# Patient Record
Sex: Female | Born: 1935
Health system: Southern US, Community
[De-identification: ages and names within clinical notes are randomized; demographics above are authoritative.]

## PROBLEM LIST (undated history)

## (undated) DIAGNOSIS — I10 Essential (primary) hypertension: Secondary | ICD-10-CM

## (undated) DIAGNOSIS — D649 Anemia, unspecified: Secondary | ICD-10-CM

## (undated) DIAGNOSIS — M1990 Unspecified osteoarthritis, unspecified site: Secondary | ICD-10-CM

## (undated) DIAGNOSIS — F0390 Unspecified dementia without behavioral disturbance: Secondary | ICD-10-CM

## (undated) HISTORY — PX: NO PAST SURGERIES: SHX2092

## (undated) HISTORY — DX: Unspecified osteoarthritis, unspecified site: M19.90

## (undated) HISTORY — DX: Anemia, unspecified: D64.9

---

## 2013-08-17 ENCOUNTER — Emergency Department (HOSPITAL_COMMUNITY)
Admission: EM | Admit: 2013-08-17 | Discharge: 2013-08-17 | Disposition: A | Discharge status: Home / Self Care | Payer: PRIVATE HEALTH INSURANCE | Attending: Emergency Medicine | Admitting: Emergency Medicine

## 2013-08-17 ENCOUNTER — Encounter (HOSPITAL_COMMUNITY): Payer: Self-pay | Admitting: Emergency Medicine

## 2013-08-17 DIAGNOSIS — Z4801 Encounter for change or removal of surgical wound dressing: Secondary | ICD-10-CM | POA: Insufficient documentation

## 2013-08-17 DIAGNOSIS — Z79899 Other long term (current) drug therapy: Secondary | ICD-10-CM | POA: Insufficient documentation

## 2013-08-17 DIAGNOSIS — Z5189 Encounter for other specified aftercare: Secondary | ICD-10-CM

## 2013-08-17 DIAGNOSIS — I10 Essential (primary) hypertension: Secondary | ICD-10-CM | POA: Insufficient documentation

## 2013-08-17 HISTORY — DX: Essential (primary) hypertension: I10

## 2013-08-17 LAB — GLUCOSE, CAPILLARY: Glucose-Capillary: 88 mg/dL (ref 70–99)

## 2013-08-17 MED ORDER — SULFAMETHOXAZOLE-TRIMETHOPRIM 800-160 MG PO TABS: 1.0000 | ORAL_TABLET | Freq: Two times a day (BID) | ORAL | Status: DC

## 2013-08-17 NOTE — ED Notes (Addendum)
Pt with wound to left lower leg from an ant bite a year ago, family member states that wound was oozing the other day, no drainage noted at this time; pt denies fever or N/V

## 2013-08-17 NOTE — ED Notes (Signed)
Sore area to  Lt lower leg.  Area has been there for 1 year.   On Friday, family member noticed that was worse. And wanted it checked before she returned to [place]

## 2013-08-17 NOTE — ED Notes (Signed)
CBG 88 

## 2013-08-19 NOTE — ED Provider Notes (Signed)
CSN: [id number]     Arrival date & time 08/17/13  1006 History   First MD Initiated Contact with Patient 08/17/13 1336     Chief Complaint  Patient presents with  . Wound Check   (Consider location/radiation/quality/duration/timing/severity/associated sxs/prior Treatment) HPI Comments: Katrina Berry is a 79 y.o. Female presenting for evaluation of a wound on her left lower anterior leg.  She was bit by fire ants one year ago, sustaining multiple areas of skin injury on her left lower leg.  There is one lesion that has not yet closed completely and has started oozing a small amount of clear yellow drainage,  Blood tinged, per the niece at bedside.  Pt is visiting here from [place] and will be returning home tomorrow and the niece "wanted this checked" before she went home.  The patient states she has undergone care for this by her pcp and also saw a wound specialist, describes having it lanced several months ago, and since then it never closed completely.  She denies pain at the site.  She denies fevers, chills, leg pain or other complaint.     The history is provided by the patient and a relative.    Past Medical History  Diagnosis Date  . Hypertension    History reviewed. No pertinent past surgical history. History reviewed. No pertinent family history. History  Substance Use Topics  . Smoking status: Never Smoker   . Smokeless tobacco: Not on file  . Alcohol Use: No   OB History   Grav Para Term Preterm Abortions TAB SAB Ect Mult Living                 Review of Systems  Constitutional: Negative for fever and chills.  HENT: Negative for facial swelling.   Respiratory: Negative for shortness of breath and wheezing.   Skin: Positive for wound.  Neurological: Negative for numbness.    Allergies  Review of patient's allergies indicates no known allergies.  Home Medications   Current Outpatient Rx  Name  Route  Sig  Dispense  Refill  . amLODipine (NORVASC) 10 MG  tablet   Oral   Take 10 mg by mouth daily.         Marland Kitchen losartan (COZAAR) 50 MG tablet   Oral   Take 50 mg by mouth daily.         Marland Kitchen sulfamethoxazole-trimethoprim (SEPTRA DS) 800-160 MG per tablet   Oral   Take 1 tablet by mouth every 12 (twelve) hours.   20 tablet   0    BP 150/68  Pulse 56  Temp(Src) 97.8 F (36.6 C) (Oral)  Resp 16  Ht 5\' 3"  (1.6 m)  Wt 145 lb (65.772 kg)  BMI 25.69 kg/m2  SpO2 100% Physical Exam  Constitutional: She appears well-developed and well-nourished. No distress.  HENT:  Head: Normocephalic.  Neck: Neck supple.  Cardiovascular: Normal rate.   Pulmonary/Chest: Effort normal. She has no wheezes.  Musculoskeletal: Normal range of motion. She exhibits no edema.  Skin: Skin is warm and dry.  Approx 0.25 cm pink based ulceration left lower anterior leg with surrounding darkened scar tissue.  No induration or fluctuance,  No drainage, no erythema or red streaking.  Pedal pulses are easily palpable,  Less than 3 sec cap refill.      ED Course  Procedures (including critical care time) Labs Review Labs Reviewed  GLUCOSE, CAPILLARY   Imaging Review No results found.  EKG Interpretation   None  MDM   1. Visit for wound check    Chronic wound which does not appear infected on exam but with hx of drainage,  Will cover for possible early infection.  Prescribed bactrim.  Encouraged elevation,  Warm soaks,  F/u by pcp once returns home.    The patient appears reasonably screened and/or stabilized for discharge and I doubt any other medical condition or other [facility] requiring further screening, evaluation, or treatment in the ED at this time prior to discharge.     Burgess Amor, PA-C 08/19/13 [phone]

## 2013-08-19 NOTE — ED Provider Notes (Signed)
History/physical exam/procedure(s) were performed by non-physician practitioner and as supervising physician I was immediately available for consultation/collaboration. I have reviewed all notes and am in agreement with care and plan.   Hilario Quarry, MD 08/19/13 [phone]

## 2013-09-16 DIAGNOSIS — I10 Essential (primary) hypertension: Secondary | ICD-10-CM | POA: Diagnosis not present

## 2013-09-16 DIAGNOSIS — Z6836 Body mass index (BMI) 36.0-36.9, adult: Secondary | ICD-10-CM | POA: Diagnosis not present

## 2014-07-16 DIAGNOSIS — I10 Essential (primary) hypertension: Secondary | ICD-10-CM | POA: Diagnosis not present

## 2014-07-16 DIAGNOSIS — L97929 Non-pressure chronic ulcer of unspecified part of left lower leg with unspecified severity: Secondary | ICD-10-CM | POA: Diagnosis not present

## 2014-07-16 DIAGNOSIS — Z79899 Other long term (current) drug therapy: Secondary | ICD-10-CM | POA: Diagnosis not present

## 2014-07-16 DIAGNOSIS — F0390 Unspecified dementia without behavioral disturbance: Secondary | ICD-10-CM | POA: Diagnosis not present

## 2014-07-16 DIAGNOSIS — L539 Erythematous condition, unspecified: Secondary | ICD-10-CM | POA: Diagnosis not present

## 2014-07-16 DIAGNOSIS — L97829 Non-pressure chronic ulcer of other part of left lower leg with unspecified severity: Secondary | ICD-10-CM | POA: Diagnosis not present

## 2014-07-17 DIAGNOSIS — S81802D Unspecified open wound, left lower leg, subsequent encounter: Secondary | ICD-10-CM | POA: Diagnosis not present

## 2014-07-17 DIAGNOSIS — F0390 Unspecified dementia without behavioral disturbance: Secondary | ICD-10-CM | POA: Diagnosis not present

## 2014-07-17 DIAGNOSIS — I10 Essential (primary) hypertension: Secondary | ICD-10-CM | POA: Diagnosis not present

## 2014-07-17 DIAGNOSIS — R001 Bradycardia, unspecified: Secondary | ICD-10-CM | POA: Diagnosis not present

## 2014-07-30 DIAGNOSIS — S81802D Unspecified open wound, left lower leg, subsequent encounter: Secondary | ICD-10-CM | POA: Diagnosis not present

## 2014-07-30 DIAGNOSIS — R001 Bradycardia, unspecified: Secondary | ICD-10-CM | POA: Diagnosis not present

## 2014-07-30 DIAGNOSIS — F0390 Unspecified dementia without behavioral disturbance: Secondary | ICD-10-CM | POA: Diagnosis not present

## 2014-08-05 DIAGNOSIS — I10 Essential (primary) hypertension: Secondary | ICD-10-CM | POA: Diagnosis not present

## 2014-08-17 DIAGNOSIS — L97929 Non-pressure chronic ulcer of unspecified part of left lower leg with unspecified severity: Secondary | ICD-10-CM | POA: Diagnosis not present

## 2014-09-03 DIAGNOSIS — S91002A Unspecified open wound, left ankle, initial encounter: Secondary | ICD-10-CM | POA: Diagnosis not present

## 2014-09-03 DIAGNOSIS — L851 Acquired keratosis [keratoderma] palmaris et plantaris: Secondary | ICD-10-CM | POA: Diagnosis not present

## 2014-09-03 DIAGNOSIS — I739 Peripheral vascular disease, unspecified: Secondary | ICD-10-CM | POA: Diagnosis not present

## 2014-09-03 DIAGNOSIS — B351 Tinea unguium: Secondary | ICD-10-CM | POA: Diagnosis not present

## 2014-09-07 ENCOUNTER — Other Ambulatory Visit (HOSPITAL_COMMUNITY): Payer: Self-pay | Admitting: Podiatry

## 2014-09-07 DIAGNOSIS — I739 Peripheral vascular disease, unspecified: Secondary | ICD-10-CM

## 2014-09-13 ENCOUNTER — Ambulatory Visit (HOSPITAL_COMMUNITY): Payer: PRIVATE HEALTH INSURANCE

## 2014-09-15 ENCOUNTER — Ambulatory Visit (HOSPITAL_COMMUNITY)
Admission: RE | Admit: 2014-09-15 | Discharge: 2014-09-15 | Disposition: A | Discharge status: Home / Self Care | Payer: Medicare Other | Source: Ambulatory Visit | Attending: Podiatry | Admitting: Podiatry

## 2014-09-15 DIAGNOSIS — I739 Peripheral vascular disease, unspecified: Secondary | ICD-10-CM | POA: Diagnosis not present

## 2014-09-15 DIAGNOSIS — S71102A Unspecified open wound, left thigh, initial encounter: Secondary | ICD-10-CM | POA: Diagnosis not present

## 2014-09-17 DIAGNOSIS — L851 Acquired keratosis [keratoderma] palmaris et plantaris: Secondary | ICD-10-CM | POA: Diagnosis not present

## 2014-09-17 DIAGNOSIS — S91002A Unspecified open wound, left ankle, initial encounter: Secondary | ICD-10-CM | POA: Diagnosis not present

## 2014-09-17 DIAGNOSIS — I739 Peripheral vascular disease, unspecified: Secondary | ICD-10-CM | POA: Diagnosis not present

## 2014-09-17 DIAGNOSIS — B351 Tinea unguium: Secondary | ICD-10-CM | POA: Diagnosis not present

## 2014-09-23 DIAGNOSIS — I872 Venous insufficiency (chronic) (peripheral): Secondary | ICD-10-CM | POA: Diagnosis not present

## 2015-01-11 DIAGNOSIS — R413 Other amnesia: Secondary | ICD-10-CM | POA: Diagnosis not present

## 2015-01-11 DIAGNOSIS — I10 Essential (primary) hypertension: Secondary | ICD-10-CM | POA: Diagnosis not present

## 2015-01-11 DIAGNOSIS — R630 Anorexia: Secondary | ICD-10-CM | POA: Diagnosis not present

## 2015-01-27 DIAGNOSIS — E784 Other hyperlipidemia: Secondary | ICD-10-CM | POA: Diagnosis not present

## 2015-01-27 DIAGNOSIS — I10 Essential (primary) hypertension: Secondary | ICD-10-CM | POA: Diagnosis not present

## 2015-07-07 ENCOUNTER — Ambulatory Visit (INDEPENDENT_AMBULATORY_CARE_PROVIDER_SITE_OTHER): Payer: Medicare Other | Admitting: Family Medicine

## 2015-07-07 ENCOUNTER — Other Ambulatory Visit: Payer: Self-pay

## 2015-07-07 ENCOUNTER — Encounter: Payer: Self-pay | Admitting: Family Medicine

## 2015-07-07 VITALS — BP 166/80 | HR 56 | Resp 18 | Ht 60.0 in | Wt 164.0 lb

## 2015-07-07 DIAGNOSIS — Z23 Encounter for immunization: Secondary | ICD-10-CM

## 2015-07-07 DIAGNOSIS — M25511 Pain in right shoulder: Secondary | ICD-10-CM | POA: Insufficient documentation

## 2015-07-07 DIAGNOSIS — S61219A Laceration without foreign body of unspecified finger without damage to nail, initial encounter: Secondary | ICD-10-CM | POA: Insufficient documentation

## 2015-07-07 DIAGNOSIS — Z7189 Other specified counseling: Secondary | ICD-10-CM | POA: Diagnosis not present

## 2015-07-07 DIAGNOSIS — E559 Vitamin D deficiency, unspecified: Secondary | ICD-10-CM

## 2015-07-07 DIAGNOSIS — Z09 Encounter for follow-up examination after completed treatment for conditions other than malignant neoplasm: Secondary | ICD-10-CM | POA: Insufficient documentation

## 2015-07-07 DIAGNOSIS — Z7689 Persons encountering health services in other specified circumstances: Secondary | ICD-10-CM

## 2015-07-07 DIAGNOSIS — F0390 Unspecified dementia without behavioral disturbance: Secondary | ICD-10-CM

## 2015-07-07 DIAGNOSIS — I10 Essential (primary) hypertension: Secondary | ICD-10-CM

## 2015-07-07 MED ORDER — ASPIRIN EC 81 MG PO TBEC: 81.0000 mg | DELAYED_RELEASE_TABLET | Freq: Every day | ORAL | Status: DC

## 2015-07-07 MED ORDER — LOSARTAN POTASSIUM 50 MG PO TABS: 50.0000 mg | ORAL_TABLET | Freq: Every day | ORAL | Status: DC

## 2015-07-07 MED ORDER — DONEPEZIL HCL 5 MG PO TABS: 5.0000 mg | ORAL_TABLET | Freq: Every day | ORAL | Status: DC

## 2015-07-07 NOTE — Assessment & Plan Note (Signed)
After obtaining informed consent, the vaccine is  administered by LPN.  

## 2015-07-07 NOTE — Assessment & Plan Note (Addendum)
EKG: sinus brady, rate of 50, minimal LVH, no ischemia Uncontrolled, on no medication for at least 4 days. Start losartan 50 mg daily DASH diet and commitment to daily physical activity for a minimum of 30 minutes discussed and encouraged, as a part of hypertension management. The importance of attaining a healthy weight is also discussed.  BP/Weight 07/07/2015 XX123456  Systolic BP XX123456 Q000111Q  Diastolic BP 80 68  Wt. (Lbs) 164.04 145  BMI 32.04 25.69

## 2015-07-07 NOTE — Patient Instructions (Addendum)
F/u in 4 weeks , call if you need me before  Start coazaar one daily for blood pressure. New is aricept one at bedtime for memory loss Take aspirin 81 mg one daily for stroke risk reduction OTC calcium with D 600 mg one daily  Keep laceration clean and loosely covered if needed till heal;s fully, and apply neosporin twice dialy for next 3 to 5 days  Labs today. Urine and EKG today  Xray of right shoulder today  Flu vaccine and TdAP today

## 2015-07-07 NOTE — Assessment & Plan Note (Signed)
keep clean , apply dry gauze loosely twice daily, neosporin twice daily till healed

## 2015-07-07 NOTE — Progress Notes (Signed)
Subjective:    Patient ID: Katrina Berry, female    DOB: November 08, 1917, 81 y.o.   MRN: [id number]  HPI New pateient visit to establish care in 81 year old lady who has been living on her own in [place] up until 5 days ago. She has established memory loss and confusion , but has not been treated for this. She is only on antihypertensive medication, and has had none in at least 5 days. Relocated by her niece who has supervised her care from [facility] , pt herself feels she is returning to [place]. She denies any falls, however , on exam she has marked limitation in movement of right shoulder Has laceration of mid  Finger with open wound , and no recall of trauma   Review of Systems See HPI Denies recent fever or chills. Denies sinus pressure, nasal congestion, ear pain or sore throat. Denies chest congestion, productive cough or wheezing. Denies chest pains, palpitations and leg swelling Denies abdominal pain, nausea, vomiting,diarrhea or constipation.   Denies dysuria, frequency, hesitancy or incontinence.  Denies headaches, seizures, numbness, or tingling. Denies depression, anxiety or insomnia.       Objective:   Physical Exam BP 166/80 mmHg  Pulse 56  Resp 18  Ht 5' (1.524 m)  Wt 164 lb 0.6 oz (74.408 kg)  BMI 32.04 kg/m2  SpO2 96%  Patient alert and oriented and in no cardiopulmonary distress.  HEENT: No facial asymmetry, EOMI,   oropharynx pink and moist.  Neck adequate ROM no JVD, no mass.  Chest: Clear to auscultation bilaterally.  CVS: S1, S2 no murmurs, no S3.Regular rate. EKG: sinus bradycardia, minimal LVH, no ischemia  ABD: Soft non tender.   Ext: No edema  MS: Adequate ROM spine,  hips and knees.Markedly reduced ROM right shoulder  Skin: Intact, no ulcerations or rash noted.  Psych: Good eye contact, normal affect. Memory impaired  not anxious or depressed appearing.  CNS: CN 2-12 intact, power,  normal throughout.no focal deficits noted.          Assessment & Plan:  Need for Tdap vaccination After obtaining informed consent, the vaccine is  administered by LPN.   Need for prophylactic vaccination and inoculation against influenza After obtaining informed consent, the vaccine is  administered by LPN.   Laceration of finger of Berry hand keep clean , apply dry gauze loosely twice daily, neosporin twice daily till healed  Essential hypertension EKG: sinus brady, rate of 50, minimal LVH, no ischemia Uncontrolled, on no medication for at least 4 days. Start losartan 50 mg daily DASH diet and commitment to daily physical activity for a minimum of 30 minutes discussed and encouraged, as a part of hypertension management. The importance of attaining a healthy weight is also discussed.  BP/Weight 07/07/2015 XX123456  Systolic BP XX123456 Q000111Q  Diastolic BP 80 68  Wt. (Lbs) 164.04 145  BMI 32.04 25.69        Dementia No behavioral disturbance, start low dose aricept  Shoulder pain, right Markedly reduced ROM right shoulder, no recall of trauma, xray to further eval  Establishing care with new doctor, encounter for Pt relocated from [place] by her niece Katrina Berry, 5 days ago, as she is 89, with dementia and living alone. She has been moved in with her sister Katrina Berry, and Katrina Berry is responsible for her, she has been a widow for over 23 years , and has no children. Only known medical illness for which she is being treated for  is hypertension, and she has had no operations. Pt believes and repeatedly states that she will return to [place] to live, however no signs of agitation displayed at visit Baseline EKG and labs obtained/ requested, and immunization needs are addressed

## 2015-07-08 NOTE — Assessment & Plan Note (Signed)
Markedly reduced ROM right shoulder, no recall of trauma, xray to further eval

## 2015-07-08 NOTE — Assessment & Plan Note (Signed)
No behavioral disturbance, start low dose aricept

## 2015-07-08 NOTE — Progress Notes (Signed)
   Subjective:    Patient ID: Katrina Berry, female    DOB: 1918-06-01, 81 y.o.   MRN: [id number]  HPI    Review of Systems     Objective:   Physical Exam Skin: open wound on mid finger of left hand under the nail       Assessment & Plan:

## 2015-07-08 NOTE — Assessment & Plan Note (Signed)
Pt relocated from [place] by her niece Katrina Berry, 5 days ago, as she is 89, with dementia and living alone. She has been moved in with her sister Katrina Berry, and Katrina Berry is responsible for her, she has been a widow for over 34 years , and has no children. Only known medical illness for which she is being treated for is hypertension, and she has had no operations. Pt believes and repeatedly states that she will return to [place] to live, however no signs of agitation displayed at visit Baseline EKG and labs obtained/ requested, and immunization needs are addressed

## 2015-07-11 ENCOUNTER — Ambulatory Visit (HOSPITAL_COMMUNITY)
Admission: RE | Admit: 2015-07-11 | Discharge: 2015-07-11 | Disposition: A | Discharge status: Home / Self Care | Payer: Medicare Other | Source: Ambulatory Visit | Attending: Family Medicine | Admitting: Family Medicine

## 2015-07-11 DIAGNOSIS — M25511 Pain in right shoulder: Secondary | ICD-10-CM | POA: Diagnosis not present

## 2015-07-11 DIAGNOSIS — I10 Essential (primary) hypertension: Secondary | ICD-10-CM | POA: Diagnosis not present

## 2015-07-11 DIAGNOSIS — M19011 Primary osteoarthritis, right shoulder: Secondary | ICD-10-CM | POA: Diagnosis not present

## 2015-07-11 DIAGNOSIS — E559 Vitamin D deficiency, unspecified: Secondary | ICD-10-CM | POA: Diagnosis not present

## 2015-07-11 DIAGNOSIS — Z7189 Other specified counseling: Secondary | ICD-10-CM | POA: Diagnosis not present

## 2015-07-12 LAB — COMPREHENSIVE METABOLIC PANEL WITH GFR
ALT: 14 U/L (ref 6–29)
AST: 25 U/L (ref 10–35)
Albumin: 3.8 g/dL (ref 3.6–5.1)
Alkaline Phosphatase: 69 U/L (ref 33–130)
BUN: 11 mg/dL (ref 7–25)
CO2: 28 mmol/L (ref 20–31)
Calcium: 9.3 mg/dL (ref 8.6–10.4)
Chloride: 106 mmol/L (ref 98–110)
Creat: 0.69 mg/dL (ref 0.60–0.88)
Glucose, Bld: 63 mg/dL — ABNORMAL LOW (ref 65–99)
Potassium: 3.7 mmol/L (ref 3.5–5.3)
Sodium: 144 mmol/L (ref 135–146)
Total Bilirubin: 0.5 mg/dL (ref 0.2–1.2)
Total Protein: 7 g/dL (ref 6.1–8.1)

## 2015-07-12 LAB — CBC WITH DIFFERENTIAL/PLATELET
Basophils Absolute: 0 K/uL (ref 0.0–0.1)
Basophils Relative: 0 % (ref 0–1)
Eosinophils Absolute: 0.2 K/uL (ref 0.0–0.7)
Eosinophils Relative: 4 % (ref 0–5)
HCT: 39 % (ref 36.0–46.0)
Hemoglobin: 12.3 g/dL (ref 12.0–15.0)
Lymphocytes Relative: 35 % (ref 12–46)
Lymphs Abs: 2.1 K/uL (ref 0.7–4.0)
MCH: 29.4 pg (ref 26.0–34.0)
MCHC: 31.5 g/dL (ref 30.0–36.0)
MCV: 93.3 fL (ref 78.0–100.0)
MPV: 11.3 fL (ref 8.6–12.4)
Monocytes Absolute: 0.5 K/uL (ref 0.1–1.0)
Monocytes Relative: 8 % (ref 3–12)
Neutro Abs: 3.2 K/uL (ref 1.7–7.7)
Neutrophils Relative %: 53 % (ref 43–77)
Platelets: 160 K/uL (ref 150–400)
RBC: 4.18 MIL/uL (ref 3.87–5.11)
RDW: 15.8 % — ABNORMAL HIGH (ref 11.5–15.5)
WBC: 6.1 K/uL (ref 4.0–10.5)

## 2015-07-12 LAB — LIPID PANEL
CHOLESTEROL: 243 mg/dL — AB (ref 125–200)
HDL: 84 mg/dL (ref >=46)
LDL Cholesterol: 138 mg/dL — ABNORMAL HIGH (ref <130)
TRIGLYCERIDES: 105 mg/dL (ref <150)
Total CHOL/HDL Ratio: 2.9 Ratio (ref <=5.0)
VLDL: 21 mg/dL (ref <30)

## 2015-07-12 LAB — TSH: TSH: 2 u[IU]/mL (ref 0.350–4.500)

## 2015-07-12 LAB — VITAMIN D 25 HYDROXY (VIT D DEFICIENCY, FRACTURES): VIT D 25 HYDROXY: 12 ng/mL — AB (ref 30–100)

## 2015-07-18 ENCOUNTER — Other Ambulatory Visit: Payer: Self-pay

## 2015-07-18 MED ORDER — ATORVASTATIN CALCIUM 10 MG PO TABS
10.0000 mg | ORAL_TABLET | Freq: Every day | ORAL | Status: DC
Start: 2015-07-18 — End: 2016-01-04

## 2015-07-18 MED ORDER — VITAMIN D (ERGOCALCIFEROL) 1.25 MG (50000 UNIT) PO CAPS: 50000.0000 [IU] | ORAL_CAPSULE | ORAL | Status: DC

## 2015-07-22 ENCOUNTER — Encounter: Payer: Self-pay | Admitting: Family Medicine

## 2015-08-02 DIAGNOSIS — B351 Tinea unguium: Secondary | ICD-10-CM | POA: Diagnosis not present

## 2015-08-02 DIAGNOSIS — L851 Acquired keratosis [keratoderma] palmaris et plantaris: Secondary | ICD-10-CM | POA: Diagnosis not present

## 2015-08-02 DIAGNOSIS — I739 Peripheral vascular disease, unspecified: Secondary | ICD-10-CM | POA: Diagnosis not present

## 2015-08-04 ENCOUNTER — Encounter: Payer: Self-pay | Admitting: Family Medicine

## 2015-08-04 ENCOUNTER — Ambulatory Visit (INDEPENDENT_AMBULATORY_CARE_PROVIDER_SITE_OTHER): Payer: Medicare Other | Admitting: Family Medicine

## 2015-08-04 VITALS — BP 164/82 | HR 58 | Resp 16 | Ht 60.0 in | Wt 152.4 lb

## 2015-08-04 DIAGNOSIS — R7301 Impaired fasting glucose: Secondary | ICD-10-CM

## 2015-08-04 DIAGNOSIS — E785 Hyperlipidemia, unspecified: Secondary | ICD-10-CM

## 2015-08-04 DIAGNOSIS — I10 Essential (primary) hypertension: Secondary | ICD-10-CM

## 2015-08-04 DIAGNOSIS — R7302 Impaired glucose tolerance (oral): Secondary | ICD-10-CM

## 2015-08-04 DIAGNOSIS — F0390 Unspecified dementia without behavioral disturbance: Secondary | ICD-10-CM | POA: Diagnosis not present

## 2015-08-04 DIAGNOSIS — M25511 Pain in right shoulder: Secondary | ICD-10-CM | POA: Diagnosis not present

## 2015-08-04 DIAGNOSIS — E559 Vitamin D deficiency, unspecified: Secondary | ICD-10-CM

## 2015-08-04 MED ORDER — DONEPEZIL HCL 10 MG PO TABS: 10.0000 mg | ORAL_TABLET | Freq: Every day | ORAL | Status: DC

## 2015-08-04 MED ORDER — LOSARTAN POTASSIUM 100 MG PO TABS: 100.0000 mg | ORAL_TABLET | Freq: Every day | ORAL | Status: DC

## 2015-08-04 NOTE — Progress Notes (Signed)
Subjective:    Patient ID: Katrina Berry, female    DOB: 04-12-1918, 81 y.o.   MRN: [id number]  HPI   Katrina Berry     MRN: [id number]      DOB: [birth year]   HPI Katrina Berry is here for follow up and re-evaluation of chronic medical conditions, medication management and review of any available recent lab and radiology data.  Preventive health is updated, specifically  Cancer screening and Immunization.   Questions or concerns regarding consultations or procedures which the PT has had in the interim are  addressed. The PT denies any adverse reactions to current medications since the last visit.  There are no new concerns.  There are no specific complaints   ROS Denies recent fever or chills. Denies sinus pressure, nasal congestion, ear pain or sore throat. Denies chest congestion, productive cough or wheezing. Denies chest pains, palpitations and leg swelling Denies abdominal pain, nausea, vomiting,diarrhea or constipation.   Denies dysuria, frequency, hesitancy or incontinence. . Denies headaches, seizures, numbness, or tingling. Denies depression, anxiety or insomnia. Denies skin break down or rash.   PE  BP 164/82 mmHg  Pulse 58  Resp 16  Ht 5' (1.524 m)  Wt 152 lb 6.4 oz (69.128 kg)  BMI 29.76 kg/m2  SpO2 93%  Patient alert and oriented and in no cardiopulmonary distress.  HEENT: No facial asymmetry, EOMI,   oropharynx pink and moist.  Neck supple no JVD, no mass.  Chest: Clear to auscultation bilaterally.  CVS: S1, S2 no murmurs, no S3.Regular rate.  ABD: Soft non tender.   Ext: No edema  MS: Adequate ROM spine, markedly reduced ROM right  Shoulders, normal in  hips and knees.  Skin: Intact, no ulcerations or rash noted.  Psych: Good eye contact, normal affect. Memory impaired  not anxious or depressed appearing.  CNS: CN 2-12 intact, power,  normal throughout.no focal deficits noted.   Assessment & Plan   Essential  hypertension Imoroved, but not at goal. Increase med dose and re eval by nurse in 5 weeks DASH diet and commitment to daily physical activity for a minimum of 30 minutes discussed and encouraged, as a part of hypertension management. The importance of attaining a healthy weight is also discussed.  BP/Weight 08/04/2015 07/07/2015 XX123456  Systolic BP 123456 XX123456 Q000111Q  Diastolic BP 82 80 68  Wt. (Lbs) 152.4 164.04 145  BMI 29.76 32.04 25.69        Dementia no adverse s/e noted with aricept 5 mg dose, increase to treating dose of 10 mg  Shoulder pain, right Radiologic confirmation of tendinitis, non operative management, encouraged to use shoulder as able  IGT (impaired glucose tolerance) Patient educated about the importance of limiting  Carbohydrate intake , the need to commit to daily physical activity for a minimum of 30 minutes , and to commit weight loss. The fact that changes in all these areas will reduce or eliminate all together the development of diabetes is stressed.   Diabetic Labs Latest Ref Rng 07/11/2015  Chol 125 - 200 mg/dL 243(H)  HDL >=46 mg/dL 84  Calc LDL <130 mg/dL 138(H)  Triglycerides <150 mg/dL 105  Creatinine 0.60 - 0.88 mg/dL 0.69   BP/Weight 08/04/2015 07/07/2015 [id number]  Systolic BP 123456 XX123456 Q000111Q  Diastolic BP 82 80 68  Wt. (Lbs) 152.4 164.04 145  BMI 29.76 32.04 25.69   No flowsheet data found.     Hyperlipidemia LDL goal <130 Hyperlipidemia:Low fat diet discussed and  encouraged.   Lipid Panel  Lab Results  Component Value Date   CHOL 243* 07/11/2015   HDL 84 07/11/2015   LDLCALC 138* 07/11/2015   TRIG 105 07/11/2015   CHOLHDL 2.9 07/11/2015      Updated lab needed at/ before next visit.   Vitamin D deficiency Start weekly vit D        Review of Systems     Objective:   Physical Exam        Assessment & Plan:

## 2015-08-04 NOTE — Patient Instructions (Addendum)
Nurse BP check in 5 weeks, call if you need me before  Annual wellness visit in 3 month  Increase cozaar, new dose is 100 mg one daily,   New is aricept 10 mg one daily for memory  Fasting lipid and cmp in 3 month

## 2015-08-07 DIAGNOSIS — E559 Vitamin D deficiency, unspecified: Secondary | ICD-10-CM | POA: Insufficient documentation

## 2015-08-07 DIAGNOSIS — E785 Hyperlipidemia, unspecified: Secondary | ICD-10-CM | POA: Insufficient documentation

## 2015-08-07 DIAGNOSIS — R7302 Impaired glucose tolerance (oral): Secondary | ICD-10-CM | POA: Insufficient documentation

## 2015-08-07 NOTE — Assessment & Plan Note (Signed)
Start weekly vit D 

## 2015-08-07 NOTE — Assessment & Plan Note (Signed)
Imoroved, but not at goal. Increase med dose and re eval by nurse in 5 weeks DASH diet and commitment to daily physical activity for a minimum of 30 minutes discussed and encouraged, as a part of hypertension management. The importance of attaining a healthy weight is also discussed.  BP/Weight 08/04/2015 07/07/2015 [id number]  Systolic BP 123456 XX123456 Q000111Q  Diastolic BP 82 80 68  Wt. (Lbs) 152.4 164.04 145  BMI 29.76 32.04 25.69

## 2015-08-07 NOTE — Assessment & Plan Note (Signed)
no adverse s/e noted with aricept 5 mg dose, increase to treating dose of 10 mg

## 2015-08-07 NOTE — Assessment & Plan Note (Signed)
Patient educated about the importance of limiting  Carbohydrate intake , the need to commit to daily physical activity for a minimum of 30 minutes , and to commit weight loss. The fact that changes in all these areas will reduce or eliminate all together the development of diabetes is stressed.   Diabetic Labs Latest Ref Rng 07/11/2015  Chol 125 - 200 mg/dL 243(H)  HDL >=46 mg/dL 84  Calc LDL <130 mg/dL 138(H)  Triglycerides <150 mg/dL 105  Creatinine 0.60 - 0.88 mg/dL 0.69   BP/Weight 08/04/2015 07/07/2015 [id number]  Systolic BP 123456 XX123456 Q000111Q  Diastolic BP 82 80 68  Wt. (Lbs) 152.4 164.04 145  BMI 29.76 32.04 25.69   No flowsheet data found.

## 2015-08-07 NOTE — Assessment & Plan Note (Signed)
Hyperlipidemia:Low fat diet discussed and encouraged.   Lipid Panel  Lab Results  Component Value Date   CHOL 243* 07/11/2015   HDL 84 07/11/2015   LDLCALC 138* 07/11/2015   TRIG 105 07/11/2015   CHOLHDL 2.9 07/11/2015      Updated lab needed at/ before next visit.

## 2015-08-07 NOTE — Assessment & Plan Note (Signed)
Radiologic confirmation of tendinitis, non operative management, encouraged to use shoulder as able

## 2015-08-11 ENCOUNTER — Telehealth: Payer: Self-pay

## 2015-08-11 NOTE — Telephone Encounter (Signed)
Called and left voicemail notifying of advice.

## 2015-08-11 NOTE — Telephone Encounter (Signed)
Tylenol 500 mg up to 2 daily is fine

## 2015-08-23 ENCOUNTER — Telehealth: Payer: Self-pay | Admitting: Family Medicine

## 2015-08-23 NOTE — Telephone Encounter (Signed)
Need rx to write prescription on if you agree

## 2015-08-23 NOTE — Telephone Encounter (Signed)
agrre will provide signed script

## 2015-08-23 NOTE — Telephone Encounter (Signed)
Patient is needing a Written order for a Bedside Commode please call [facility]

## 2015-08-23 NOTE — Telephone Encounter (Signed)
Called to let niece know the script was ready

## 2015-09-08 ENCOUNTER — Ambulatory Visit: Payer: Medicare Other

## 2015-09-08 VITALS — BP 150/70 | Wt 156.0 lb

## 2015-09-08 DIAGNOSIS — I10 Essential (primary) hypertension: Secondary | ICD-10-CM

## 2015-09-08 NOTE — Progress Notes (Signed)
BP check 150/70 DASH diet given  Not willing to add additional med at this time

## 2015-10-18 DIAGNOSIS — B351 Tinea unguium: Secondary | ICD-10-CM | POA: Diagnosis not present

## 2015-10-18 DIAGNOSIS — I739 Peripheral vascular disease, unspecified: Secondary | ICD-10-CM | POA: Diagnosis not present

## 2015-10-18 DIAGNOSIS — L851 Acquired keratosis [keratoderma] palmaris et plantaris: Secondary | ICD-10-CM | POA: Diagnosis not present

## 2015-10-26 DIAGNOSIS — I10 Essential (primary) hypertension: Secondary | ICD-10-CM | POA: Diagnosis not present

## 2015-10-26 DIAGNOSIS — R7301 Impaired fasting glucose: Secondary | ICD-10-CM | POA: Diagnosis not present

## 2015-10-27 LAB — COMPREHENSIVE METABOLIC PANEL
ALBUMIN: 3.6 g/dL (ref 3.6–5.1)
ALT: 12 U/L (ref 6–29)
AST: 16 U/L (ref 10–35)
Alkaline Phosphatase: 53 U/L (ref 33–130)
BILIRUBIN TOTAL: 0.5 mg/dL (ref 0.2–1.2)
BUN: 11 mg/dL (ref 7–25)
CO2: 29 mmol/L (ref 20–31)
CREATININE: 0.89 mg/dL — AB (ref 0.60–0.88)
Calcium: 9.1 mg/dL (ref 8.6–10.4)
Chloride: 108 mmol/L (ref 98–110)
Glucose, Bld: 76 mg/dL (ref 65–99)
Potassium: 3.8 mmol/L (ref 3.5–5.3)
SODIUM: 144 mmol/L (ref 135–146)
TOTAL PROTEIN: 6.2 g/dL (ref 6.1–8.1)

## 2015-10-27 LAB — LIPID PANEL
CHOL/HDL RATIO: 2.3 ratio (ref <=5.0)
Cholesterol: 167 mg/dL (ref 125–200)
HDL: 74 mg/dL (ref >=46)
LDL Cholesterol: 72 mg/dL (ref <130)
Triglycerides: 106 mg/dL (ref <150)
VLDL: 21 mg/dL (ref <30)

## 2015-10-27 LAB — HEMOGLOBIN A1C
Hgb A1c MFr Bld: 5.7 % — ABNORMAL HIGH (ref <5.7)
MEAN PLASMA GLUCOSE: 117 mg/dL — AB (ref <117)

## 2015-11-02 ENCOUNTER — Encounter: Payer: Self-pay | Admitting: Family Medicine

## 2015-11-02 ENCOUNTER — Ambulatory Visit (INDEPENDENT_AMBULATORY_CARE_PROVIDER_SITE_OTHER): Payer: Medicare Other | Admitting: Family Medicine

## 2015-11-02 VITALS — BP 150/70 | HR 50 | Resp 18 | Ht 60.0 in | Wt 157.0 lb

## 2015-11-02 DIAGNOSIS — I10 Essential (primary) hypertension: Secondary | ICD-10-CM | POA: Diagnosis not present

## 2015-11-02 DIAGNOSIS — E559 Vitamin D deficiency, unspecified: Secondary | ICD-10-CM

## 2015-11-02 DIAGNOSIS — M25511 Pain in right shoulder: Secondary | ICD-10-CM

## 2015-11-02 DIAGNOSIS — F0390 Unspecified dementia without behavioral disturbance: Secondary | ICD-10-CM

## 2015-11-02 DIAGNOSIS — E785 Hyperlipidemia, unspecified: Secondary | ICD-10-CM | POA: Diagnosis not present

## 2015-11-02 NOTE — Progress Notes (Signed)
   Subjective:    Patient ID: Katrina Berry, female    DOB: Dec 12, 1917, 82 y.o.   MRN: [id number]  HPI   Katrina Berry     MRN: [id number]      DOB: 17-Sep-1917   HPI Katrina Berry is here for follow up and re-evaluation of chronic medical conditions, medication management and review of any available recent lab and radiology data.  Preventive health is updated, specifically  Cancer screening and Immunization. Needs pneumonia vaccine but deferred at this isit    There are no new concerns.  There are no specific complaints   ROS History from caregiver , her niece , [name] Denies recent fever or chills. Denies sinus pressure, nasal congestion, ear pain or sore throat. Denies chest congestion, productive cough or wheezing. Denies chest pains, palpitations and leg swelling Denies abdominal pain, nausea, vomiting,diarrhea or constipation.   Denies dysuria, frequency, hesitancy or incontinence.  Denies headaches, seizures, numbness, or tingling. Denies depression, anxiety or insomnia. Denies skin break down or rash.   PE  BP 150/70 mmHg  Pulse 50  Resp 18  Ht 5' (1.524 m)  Wt 157 lb (71.215 kg)  BMI 30.66 kg/m2  SpO2 97%  Patient alert  and in no cardiopulmonary distress.  HEENT: No facial asymmetry, EOMI,   oropharynx pink and moist.  Neck decreased though adequate ROM,  no JVD, no mass.  Chest: Clear to auscultation bilaterally.  CVS: S1, S2 no murmurs, no S3.Regular rate.  ABD: Soft non tender.   Ext: No edema  MS: Adequate ROM spine,  hips and knees.Decreaed in righ shouler  Skin: Intact, no ulcerations or rash noted.  Psych: Good eye contact, normal affect. Memory loss not anxious or depressed appearing.  CNS: CN 2-12 intact, power,  normal throughout.no focal deficits noted.   Assessment & Plan   Essential hypertension Not at goal, no med change due too patient's age and overall level of function,  increasing the dose may increase her fall risk   Dementia Continue current dose of aricept, condition grossly unchanged  Hyperlipidemia LDL goal <130 Hyperlipidemia:Low fat diet discussed and encouraged.   Lipid Panel  Lab Results  Component Value Date   CHOL 167 10/26/2015   HDL 74 10/26/2015   LDLCALC 72 10/26/2015   TRIG 106 10/26/2015   CHOLHDL 2.3 10/26/2015   Controlled, no change in medication     Vitamin D deficiency Pt to continue weekly vit D  Shoulder pain, right Limitation in ROM, but pt able to use the RUE to dress and feed herself      Review of Systems     Objective:   Physical Exam        Assessment & Plan:

## 2015-11-02 NOTE — Patient Instructions (Addendum)
Wellness visit in September, call if you need me before  [place]!!!  Excellent labs, continue current medicaiton   Cut back on cakes and icecream, increase vegetable and fruit, blood sugar slightly high  Thanks for choosing [facility], we consider it a privelige to serve you.

## 2015-11-06 NOTE — Assessment & Plan Note (Signed)
Pt to continue weekly vit D

## 2015-11-06 NOTE — Assessment & Plan Note (Signed)
Continue current dose of aricept, condition grossly unchanged

## 2015-11-06 NOTE — Assessment & Plan Note (Signed)
Not at goal, no med change due too patient's age and overall level of function, increasing the dose may increase her fall risk

## 2015-11-06 NOTE — Assessment & Plan Note (Signed)
Limitation in ROM, but pt able to use the RUE to dress and feed herself

## 2015-11-06 NOTE — Assessment & Plan Note (Signed)
Hyperlipidemia:Low fat diet discussed and encouraged.   Lipid Panel  Lab Results  Component Value Date   CHOL 167 10/26/2015   HDL 74 10/26/2015   LDLCALC 72 10/26/2015   TRIG 106 10/26/2015   CHOLHDL 2.3 10/26/2015   Controlled, no change in medication

## 2015-12-23 ENCOUNTER — Other Ambulatory Visit: Payer: Self-pay | Admitting: Family Medicine

## 2015-12-27 DIAGNOSIS — L97512 Non-pressure chronic ulcer of other part of right foot with fat layer exposed: Secondary | ICD-10-CM | POA: Diagnosis not present

## 2015-12-27 DIAGNOSIS — I739 Peripheral vascular disease, unspecified: Secondary | ICD-10-CM | POA: Diagnosis not present

## 2015-12-27 DIAGNOSIS — B351 Tinea unguium: Secondary | ICD-10-CM | POA: Diagnosis not present

## 2015-12-27 DIAGNOSIS — L851 Acquired keratosis [keratoderma] palmaris et plantaris: Secondary | ICD-10-CM | POA: Diagnosis not present

## 2015-12-30 ENCOUNTER — Other Ambulatory Visit: Payer: Self-pay | Admitting: Family Medicine

## 2016-01-04 ENCOUNTER — Other Ambulatory Visit: Payer: Self-pay | Admitting: Family Medicine

## 2016-01-10 DIAGNOSIS — L97512 Non-pressure chronic ulcer of other part of right foot with fat layer exposed: Secondary | ICD-10-CM | POA: Diagnosis not present

## 2016-02-07 ENCOUNTER — Encounter (HOSPITAL_COMMUNITY): Payer: Self-pay | Admitting: Cardiology

## 2016-02-07 ENCOUNTER — Other Ambulatory Visit: Payer: Self-pay

## 2016-02-07 ENCOUNTER — Telehealth: Payer: Self-pay

## 2016-02-07 ENCOUNTER — Emergency Department (HOSPITAL_COMMUNITY)
Admission: EM | Admit: 2016-02-07 | Discharge: 2016-02-07 | Disposition: A | Discharge status: Home / Self Care | Payer: Medicare Other | Attending: Emergency Medicine | Admitting: Emergency Medicine

## 2016-02-07 DIAGNOSIS — Y939 Activity, unspecified: Secondary | ICD-10-CM | POA: Insufficient documentation

## 2016-02-07 DIAGNOSIS — R296 Repeated falls: Secondary | ICD-10-CM | POA: Diagnosis not present

## 2016-02-07 DIAGNOSIS — I10 Essential (primary) hypertension: Secondary | ICD-10-CM | POA: Diagnosis not present

## 2016-02-07 DIAGNOSIS — F0390 Unspecified dementia without behavioral disturbance: Secondary | ICD-10-CM | POA: Insufficient documentation

## 2016-02-07 DIAGNOSIS — R001 Bradycardia, unspecified: Secondary | ICD-10-CM | POA: Diagnosis not present

## 2016-02-07 DIAGNOSIS — R350 Frequency of micturition: Secondary | ICD-10-CM | POA: Diagnosis not present

## 2016-02-07 DIAGNOSIS — Z7982 Long term (current) use of aspirin: Secondary | ICD-10-CM | POA: Diagnosis not present

## 2016-02-07 DIAGNOSIS — X58XXXA Exposure to other specified factors, initial encounter: Secondary | ICD-10-CM | POA: Diagnosis not present

## 2016-02-07 DIAGNOSIS — Y999 Unspecified external cause status: Secondary | ICD-10-CM | POA: Insufficient documentation

## 2016-02-07 DIAGNOSIS — R402411 Glasgow coma scale score 13-15, in the field [EMT or ambulance]: Secondary | ICD-10-CM | POA: Diagnosis not present

## 2016-02-07 DIAGNOSIS — M1990 Unspecified osteoarthritis, unspecified site: Secondary | ICD-10-CM | POA: Diagnosis not present

## 2016-02-07 DIAGNOSIS — R5383 Other fatigue: Secondary | ICD-10-CM | POA: Diagnosis not present

## 2016-02-07 DIAGNOSIS — Z79899 Other long term (current) drug therapy: Secondary | ICD-10-CM | POA: Insufficient documentation

## 2016-02-07 DIAGNOSIS — Y929 Unspecified place or not applicable: Secondary | ICD-10-CM | POA: Diagnosis not present

## 2016-02-07 DIAGNOSIS — R269 Unspecified abnormalities of gait and mobility: Secondary | ICD-10-CM | POA: Diagnosis not present

## 2016-02-07 HISTORY — DX: Unspecified dementia, unspecified severity, without behavioral disturbance, psychotic disturbance, mood disturbance, and anxiety: F03.90

## 2016-02-07 LAB — BASIC METABOLIC PANEL
ANION GAP: 7 (ref 5–15)
BUN: 14 mg/dL (ref 6–20)
CALCIUM: 9.2 mg/dL (ref 8.9–10.3)
CO2: 28 mmol/L (ref 22–32)
Chloride: 106 mmol/L (ref 101–111)
Creatinine, Ser: 0.88 mg/dL (ref 0.44–1.00)
GFR calc Af Amer: >60 mL/min (ref >60)
GFR, EST NON AFRICAN AMERICAN: 54 mL/min — AB (ref >60)
Glucose, Bld: 89 mg/dL (ref 65–99)
Potassium: 3.7 mmol/L (ref 3.5–5.1)
Sodium: 141 mmol/L (ref 135–145)

## 2016-02-07 LAB — CBC
HCT: 37.9 % (ref 36.0–46.0)
Hemoglobin: 12.1 g/dL (ref 12.0–15.0)
MCH: 30.9 pg (ref 26.0–34.0)
MCHC: 31.9 g/dL (ref 30.0–36.0)
MCV: 96.7 fL (ref 78.0–100.0)
PLATELETS: 188 10*3/uL (ref 150–400)
RBC: 3.92 MIL/uL (ref 3.87–5.11)
RDW: 15 % (ref 11.5–15.5)
WBC: 6.2 10*3/uL (ref 4.0–10.5)

## 2016-02-07 LAB — URINALYSIS, ROUTINE W REFLEX MICROSCOPIC
BILIRUBIN URINE: NEGATIVE
GLUCOSE, UA: NEGATIVE mg/dL
Ketones, ur: NEGATIVE mg/dL
LEUKOCYTES UA: NEGATIVE
Nitrite: NEGATIVE
PH: 6 (ref 5.0–8.0)
PROTEIN: NEGATIVE mg/dL
Specific Gravity, Urine: <1.005 — ABNORMAL LOW (ref 1.005–1.030)

## 2016-02-07 LAB — URINE MICROSCOPIC-ADD ON
Bacteria, UA: NONE SEEN
WBC, UA: NONE SEEN WBC/hpf (ref 0–5)

## 2016-02-07 NOTE — ED Notes (Signed)
Pt able to use bedside comode.

## 2016-02-07 NOTE — ED Notes (Signed)
Pt left ED via wheelchair with family and with no signs of distress. POA verbalized discharge understanding.

## 2016-02-07 NOTE — ED Provider Notes (Signed)
CSN: [id number]     Arrival date & time 02/07/16  1018 History  By signing my name below, I, Reola Mosher, attest that this documentation has been prepared under the direction and in the presence of Jola Schmidt, MD.  Electronically Signed: Reola Mosher, ED Scribe. 02/07/2016. 11:16 AM.    Chief Complaint  Patient presents with  . Fall   The history is provided by the patient and a relative. The history is limited by the condition of the patient. No language interpreter was used.   LEVEL 5 CAVEAT: HPI and ROS limited due to dementia.   HPI Comments: Katrina Berry is a 82 y.o. female who presents to the Emergency Department for evaluation after having 2 ground-level falls. She has associated frequency and fatigue. Pts niece reports that her first fall was two days ago and she was found on the floor, fully nude, and covered in stools. Pts niece reports that she saw no ecchymoses or obvious deformities after the first fall.  Her niece reports that she fell again this morning PTA and was found by her caretaker on the floor. Her niece denies a hx of similar falls. Pt denies pain at this time and is not complaining of any other symptoms. She does not have a hx of UTIs. Pts niece denies cough or fever.   Past Medical History  Diagnosis Date  . Hypertension   . Arthritis     mild, no known falls uses cane  . Dementia    History reviewed. No pertinent past surgical history. Family History  Problem Relation Age of Onset  . Diabetes Sister   . Hypertension Sister    Social History  Substance Use Topics  . Smoking status: Never Smoker   . Smokeless tobacco: None  . Alcohol Use: No   OB History    No data available     Review of Systems  Unable to perform ROS: Dementia   A complete 10 system review of systems was obtained and all systems are negative except as noted in the HPI and PMH.   Allergies  Review of patient's allergies indicates no known  allergies.  Home Medications   Prior to Admission medications   Medication Sig Start Date End Date Taking? Authorizing Provider  acetaminophen (TYLENOL) 500 MG tablet Take 500 mg by mouth every 6 (six) hours as needed.   Yes Historical Provider, MD  aspirin EC 81 MG tablet Take 1 tablet (81 mg total) by mouth daily. 07/07/15  Yes Fayrene Helper, MD  atorvastatin (LIPITOR) 10 MG tablet TAKE 1 TABLET (10 MG TOTAL) BY MOUTH DAILY. 01/04/16  Yes Fayrene Helper, MD  donepezil (ARICEPT) 10 MG tablet TAKE 1 TABLET (10 MG TOTAL) BY MOUTH AT BEDTIME. 12/26/15  Yes Fayrene Helper, MD  losartan (COZAAR) 100 MG tablet TAKE 1 TABLET (100 MG TOTAL) BY MOUTH DAILY. 12/26/15  Yes Fayrene Helper, MD  Vitamin D, Ergocalciferol, (DRISDOL) 50000 units CAPS capsule TAKE 1 CAPSULE (50,000 UNITS TOTAL) BY MOUTH EVERY 7 (SEVEN) DAYS. 12/30/15  Yes Fayrene Helper, MD   BP 173/76 mmHg  Pulse 98  Temp(Src) 98.1 F (36.7 C) (Oral)  Resp 15  Ht 5\' 2"  (1.575 m)  Wt 157 lb (71.215 kg)  BMI 28.71 kg/m2  SpO2 89%   Physical Exam  Constitutional: She appears well-developed and well-nourished.  HENT:  Head: Normocephalic.  Eyes: EOM are normal.  Neck: Normal range of motion.  Cardiovascular: Normal rate, regular  rhythm and normal heart sounds.   No murmur heard. Pulmonary/Chest: Effort normal and breath sounds normal. No respiratory distress. She has no wheezes. She has no rales.  Abdominal: Soft. She exhibits no distension. There is no tenderness.  Musculoskeletal: Normal range of motion.  She has full ROM of bilateral hips. She is moving all four extremities freely.  Neurological: She is alert.  Psychiatric: She has a normal mood and affect.  Nursing note and vitals reviewed.  ED Course  Procedures (including critical care time)  DIAGNOSTIC STUDIES: Oxygen Saturation is 100% on RA, normal by my interpretation.   COORDINATION OF CARE: 10:58 AM-Discussed next steps with pt including UA,  EKG and BMP. Pt verbalized understanding and is agreeable with the plan.   Labs Review Labs Reviewed  BASIC METABOLIC PANEL - Abnormal; Notable for the following:    GFR calc non Af Amer 54 (*)    All other components within normal limits  URINALYSIS, ROUTINE W REFLEX MICROSCOPIC (NOT AT [facility]) - Abnormal; Notable for the following:    Specific Gravity, Urine <1.005 (*)    Hgb urine dipstick TRACE (*)    All other components within normal limits  URINE MICROSCOPIC-ADD ON - Abnormal; Notable for the following:    Squamous Epithelial / LPF 0-5 (*)    All other components within normal limits  CBC    I have personally reviewed and evaluated these lab results as part of my medical decision-making.   EKG Interpretation None      MDM   Final diagnoses:  None    Pt is overall well appearing. Ambulatory in the ER. Labs and urine without abnormality. Case management involved. Pt will be given home health RN, PT, Aide and SW   I personally performed the services described in this documentation, which was scribed in my presence. The recorded information has been reviewed and is accurate.    Jola Schmidt, MD 02/07/16 1304

## 2016-02-07 NOTE — Care Management (Signed)
CM consult received for [facility] services. Pt lives with her sister and has a niece who cares for both pt and her sister. Pt's sister has CAP aid and pt's niece pays the sister aid OOP to make sure pt gets her medications. Pt ambulates with walker and has a BSC. Pt's sister is at the bedside and agree's with [facility] services. Pt and sister understand services will be temporary and HH will have 48 hours to make their first visit. Pt's sister also given list of PD agencies they may contact to arrange for aid services, family aware aid services will have to be paid OOP and are not covered by insurance. Pt does not have Medicaid as she owns property in [place]. 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 Pt retired at the age of 89. Pt/family has chosen [facility] from list of [facility] agencies. Romualdo Bolk, of [facility] made aware of referral and will obtain pt info from chart. DC plan discussed with EDP and RN.

## 2016-02-07 NOTE — Discharge Instructions (Signed)
Fall Prevention in the Home  Falls can cause injuries and can affect people from all age groups. There are many simple things that you can do to make your home safe and to help prevent falls. WHAT CAN I DO ON THE OUTSIDE OF MY HOME?  Regularly repair the edges of walkways and driveways and fix any cracks.  Remove high doorway thresholds.  Trim any shrubbery on the main path into your home.  Use bright outdoor lighting.  Clear walkways of debris and clutter, including tools and rocks.  Regularly check that handrails are securely fastened and in good repair. Both sides of any steps should have handrails.  Install guardrails along the edges of any raised decks or porches.  Have leaves, snow, and ice cleared regularly.  Use sand or salt on walkways during winter months.  In the garage, clean up any spills right away, including grease or oil spills. WHAT CAN I DO IN THE BATHROOM?  Use night lights.  Install grab bars by the toilet and in the tub and shower. Do not use towel bars as grab bars.  Use non-skid mats or decals on the floor of the tub or shower.  If you need to sit down while you are in the shower, use a plastic, non-slip stool..  Keep the floor dry. Immediately clean up any water that spills on the floor.  Remove soap buildup in the tub or shower on a regular basis.  Attach bath mats securely with double-sided non-slip rug tape.  Remove throw rugs and other tripping hazards from the floor. WHAT CAN I DO IN THE BEDROOM?  Use night lights.  Make sure that a bedside light is easy to reach.  Do not use oversized bedding that drapes onto the floor.  Have a firm chair that has side arms to use for getting dressed.  Remove throw rugs and other tripping hazards from the floor. WHAT CAN I DO IN THE KITCHEN?   Clean up any spills right away.  Avoid walking on wet floors.  Place frequently used items in easy-to-reach places.  If you need to reach for something  above you, use a sturdy step stool that has a grab bar.  Keep electrical cables out of the way.  Do not use floor polish or wax that makes floors slippery. If you have to use wax, make sure that it is non-skid floor wax.  Remove throw rugs and other tripping hazards from the floor. WHAT CAN I DO IN THE STAIRWAYS?  Do not leave any items on the stairs.  Make sure that there are handrails on both sides of the stairs. Fix handrails that are broken or loose. Make sure that handrails are as long as the stairways.  Check any carpeting to make sure that it is firmly attached to the stairs. Fix any carpet that is loose or worn.  Avoid having throw rugs at the top or bottom of stairways, or secure the rugs with carpet tape to prevent them from moving.  Make sure that you have a light switch at the top of the stairs and the bottom of the stairs. If you do not have them, have them installed. WHAT ARE SOME OTHER FALL PREVENTION TIPS?  Wear closed-toe shoes that fit well and support your feet. Wear shoes that have rubber soles or low heels.  When you use a stepladder, make sure that it is completely opened and that the sides are firmly locked. Have someone hold the ladder while you   are using it. Do not climb a closed stepladder.  Add color or contrast paint or tape to grab bars and handrails in your home. Place contrasting color strips on the first and last steps.  Use mobility aids as needed, such as canes, walkers, scooters, and crutches.  Turn on lights if it is dark. Replace any light bulbs that burn out.  Set up furniture so that there are clear paths. Keep the furniture in the same spot.  Fix any uneven floor surfaces.  Choose a carpet design that does not hide the edge of steps of a stairway.  Be aware of any and all pets.  Review your medicines with your healthcare provider. Some medicines can cause dizziness or changes in blood pressure, which increase your risk of falling. Talk  with your health care provider about other ways that you can decrease your risk of falls. This may include working with a physical therapist or trainer to improve your strength, balance, and endurance.   This information is not intended to replace advice given to you by your health care provider. Make sure you discuss any questions you have with your health care provider.   Document Released: 07/27/2002 Document Revised: 12/21/2014 Document Reviewed: 09/10/2014 Elsevier Interactive Patient Education 2016 Elsevier Inc.  

## 2016-02-07 NOTE — Telephone Encounter (Signed)
noted 

## 2016-02-07 NOTE — ED Notes (Signed)
Pt ambulated approximatley 100 feet with walker without difficulty.

## 2016-02-07 NOTE — ED Notes (Signed)
Pt fell times 2  Since yesterday.  Pt has dementia and caretaker states pt has been more confused. No known injuries from fall.  Pt ambulatory without difficulty at scene.  CBG 104.  Pt hypertensive.  204/88

## 2016-02-09 DIAGNOSIS — R531 Weakness: Secondary | ICD-10-CM | POA: Diagnosis not present

## 2016-02-09 DIAGNOSIS — F0280 Dementia in other diseases classified elsewhere without behavioral disturbance: Secondary | ICD-10-CM | POA: Diagnosis not present

## 2016-02-09 DIAGNOSIS — R001 Bradycardia, unspecified: Secondary | ICD-10-CM | POA: Diagnosis not present

## 2016-02-09 DIAGNOSIS — I959 Hypotension, unspecified: Secondary | ICD-10-CM | POA: Diagnosis not present

## 2016-02-09 DIAGNOSIS — W19XXXD Unspecified fall, subsequent encounter: Secondary | ICD-10-CM | POA: Diagnosis not present

## 2016-02-09 DIAGNOSIS — M1990 Unspecified osteoarthritis, unspecified site: Secondary | ICD-10-CM | POA: Diagnosis not present

## 2016-02-14 DIAGNOSIS — R531 Weakness: Secondary | ICD-10-CM | POA: Diagnosis not present

## 2016-02-14 DIAGNOSIS — M1990 Unspecified osteoarthritis, unspecified site: Secondary | ICD-10-CM | POA: Diagnosis not present

## 2016-02-14 DIAGNOSIS — R001 Bradycardia, unspecified: Secondary | ICD-10-CM | POA: Diagnosis not present

## 2016-02-14 DIAGNOSIS — W19XXXD Unspecified fall, subsequent encounter: Secondary | ICD-10-CM | POA: Diagnosis not present

## 2016-02-14 DIAGNOSIS — F0280 Dementia in other diseases classified elsewhere without behavioral disturbance: Secondary | ICD-10-CM | POA: Diagnosis not present

## 2016-02-14 DIAGNOSIS — I959 Hypotension, unspecified: Secondary | ICD-10-CM | POA: Diagnosis not present

## 2016-02-16 DIAGNOSIS — R001 Bradycardia, unspecified: Secondary | ICD-10-CM | POA: Diagnosis not present

## 2016-02-16 DIAGNOSIS — F0280 Dementia in other diseases classified elsewhere without behavioral disturbance: Secondary | ICD-10-CM | POA: Diagnosis not present

## 2016-02-16 DIAGNOSIS — R531 Weakness: Secondary | ICD-10-CM | POA: Diagnosis not present

## 2016-02-16 DIAGNOSIS — M1990 Unspecified osteoarthritis, unspecified site: Secondary | ICD-10-CM | POA: Diagnosis not present

## 2016-02-16 DIAGNOSIS — I959 Hypotension, unspecified: Secondary | ICD-10-CM | POA: Diagnosis not present

## 2016-02-16 DIAGNOSIS — W19XXXD Unspecified fall, subsequent encounter: Secondary | ICD-10-CM | POA: Diagnosis not present

## 2016-02-17 DIAGNOSIS — R001 Bradycardia, unspecified: Secondary | ICD-10-CM | POA: Diagnosis not present

## 2016-02-17 DIAGNOSIS — F0280 Dementia in other diseases classified elsewhere without behavioral disturbance: Secondary | ICD-10-CM | POA: Diagnosis not present

## 2016-02-17 DIAGNOSIS — R531 Weakness: Secondary | ICD-10-CM | POA: Diagnosis not present

## 2016-02-17 DIAGNOSIS — W19XXXD Unspecified fall, subsequent encounter: Secondary | ICD-10-CM | POA: Diagnosis not present

## 2016-02-17 DIAGNOSIS — I959 Hypotension, unspecified: Secondary | ICD-10-CM | POA: Diagnosis not present

## 2016-02-17 DIAGNOSIS — M1990 Unspecified osteoarthritis, unspecified site: Secondary | ICD-10-CM | POA: Diagnosis not present

## 2016-02-20 DIAGNOSIS — F0280 Dementia in other diseases classified elsewhere without behavioral disturbance: Secondary | ICD-10-CM | POA: Diagnosis not present

## 2016-02-20 DIAGNOSIS — R001 Bradycardia, unspecified: Secondary | ICD-10-CM | POA: Diagnosis not present

## 2016-02-20 DIAGNOSIS — I959 Hypotension, unspecified: Secondary | ICD-10-CM | POA: Diagnosis not present

## 2016-02-20 DIAGNOSIS — R531 Weakness: Secondary | ICD-10-CM | POA: Diagnosis not present

## 2016-02-20 DIAGNOSIS — W19XXXD Unspecified fall, subsequent encounter: Secondary | ICD-10-CM | POA: Diagnosis not present

## 2016-02-20 DIAGNOSIS — M1990 Unspecified osteoarthritis, unspecified site: Secondary | ICD-10-CM | POA: Diagnosis not present

## 2016-02-22 DIAGNOSIS — F0280 Dementia in other diseases classified elsewhere without behavioral disturbance: Secondary | ICD-10-CM | POA: Diagnosis not present

## 2016-02-22 DIAGNOSIS — I959 Hypotension, unspecified: Secondary | ICD-10-CM | POA: Diagnosis not present

## 2016-02-22 DIAGNOSIS — W19XXXD Unspecified fall, subsequent encounter: Secondary | ICD-10-CM | POA: Diagnosis not present

## 2016-02-22 DIAGNOSIS — M1990 Unspecified osteoarthritis, unspecified site: Secondary | ICD-10-CM | POA: Diagnosis not present

## 2016-02-22 DIAGNOSIS — R531 Weakness: Secondary | ICD-10-CM | POA: Diagnosis not present

## 2016-02-22 DIAGNOSIS — R001 Bradycardia, unspecified: Secondary | ICD-10-CM | POA: Diagnosis not present

## 2016-02-23 DIAGNOSIS — I959 Hypotension, unspecified: Secondary | ICD-10-CM | POA: Diagnosis not present

## 2016-02-23 DIAGNOSIS — W19XXXD Unspecified fall, subsequent encounter: Secondary | ICD-10-CM | POA: Diagnosis not present

## 2016-02-23 DIAGNOSIS — R531 Weakness: Secondary | ICD-10-CM | POA: Diagnosis not present

## 2016-02-23 DIAGNOSIS — M1990 Unspecified osteoarthritis, unspecified site: Secondary | ICD-10-CM | POA: Diagnosis not present

## 2016-02-23 DIAGNOSIS — R001 Bradycardia, unspecified: Secondary | ICD-10-CM | POA: Diagnosis not present

## 2016-02-23 DIAGNOSIS — F0280 Dementia in other diseases classified elsewhere without behavioral disturbance: Secondary | ICD-10-CM | POA: Diagnosis not present

## 2016-02-28 DIAGNOSIS — R531 Weakness: Secondary | ICD-10-CM | POA: Diagnosis not present

## 2016-02-28 DIAGNOSIS — R001 Bradycardia, unspecified: Secondary | ICD-10-CM | POA: Diagnosis not present

## 2016-02-28 DIAGNOSIS — M1990 Unspecified osteoarthritis, unspecified site: Secondary | ICD-10-CM | POA: Diagnosis not present

## 2016-02-28 DIAGNOSIS — F0280 Dementia in other diseases classified elsewhere without behavioral disturbance: Secondary | ICD-10-CM | POA: Diagnosis not present

## 2016-02-28 DIAGNOSIS — I959 Hypotension, unspecified: Secondary | ICD-10-CM | POA: Diagnosis not present

## 2016-02-28 DIAGNOSIS — W19XXXD Unspecified fall, subsequent encounter: Secondary | ICD-10-CM | POA: Diagnosis not present

## 2016-02-29 DIAGNOSIS — F0280 Dementia in other diseases classified elsewhere without behavioral disturbance: Secondary | ICD-10-CM | POA: Diagnosis not present

## 2016-02-29 DIAGNOSIS — R531 Weakness: Secondary | ICD-10-CM | POA: Diagnosis not present

## 2016-02-29 DIAGNOSIS — I959 Hypotension, unspecified: Secondary | ICD-10-CM | POA: Diagnosis not present

## 2016-02-29 DIAGNOSIS — M1990 Unspecified osteoarthritis, unspecified site: Secondary | ICD-10-CM | POA: Diagnosis not present

## 2016-02-29 DIAGNOSIS — W19XXXD Unspecified fall, subsequent encounter: Secondary | ICD-10-CM | POA: Diagnosis not present

## 2016-02-29 DIAGNOSIS — R001 Bradycardia, unspecified: Secondary | ICD-10-CM | POA: Diagnosis not present

## 2016-03-06 DIAGNOSIS — R001 Bradycardia, unspecified: Secondary | ICD-10-CM | POA: Diagnosis not present

## 2016-03-06 DIAGNOSIS — R531 Weakness: Secondary | ICD-10-CM | POA: Diagnosis not present

## 2016-03-06 DIAGNOSIS — W19XXXD Unspecified fall, subsequent encounter: Secondary | ICD-10-CM | POA: Diagnosis not present

## 2016-03-06 DIAGNOSIS — F0280 Dementia in other diseases classified elsewhere without behavioral disturbance: Secondary | ICD-10-CM | POA: Diagnosis not present

## 2016-03-06 DIAGNOSIS — I959 Hypotension, unspecified: Secondary | ICD-10-CM | POA: Diagnosis not present

## 2016-03-06 DIAGNOSIS — M1990 Unspecified osteoarthritis, unspecified site: Secondary | ICD-10-CM | POA: Diagnosis not present

## 2016-03-07 DIAGNOSIS — M1990 Unspecified osteoarthritis, unspecified site: Secondary | ICD-10-CM | POA: Diagnosis not present

## 2016-03-07 DIAGNOSIS — F0280 Dementia in other diseases classified elsewhere without behavioral disturbance: Secondary | ICD-10-CM | POA: Diagnosis not present

## 2016-03-07 DIAGNOSIS — R531 Weakness: Secondary | ICD-10-CM | POA: Diagnosis not present

## 2016-03-07 DIAGNOSIS — I959 Hypotension, unspecified: Secondary | ICD-10-CM | POA: Diagnosis not present

## 2016-03-07 DIAGNOSIS — W19XXXD Unspecified fall, subsequent encounter: Secondary | ICD-10-CM | POA: Diagnosis not present

## 2016-03-07 DIAGNOSIS — R001 Bradycardia, unspecified: Secondary | ICD-10-CM | POA: Diagnosis not present

## 2016-03-08 ENCOUNTER — Telehealth: Payer: Self-pay | Admitting: Family Medicine

## 2016-03-08 NOTE — Telephone Encounter (Signed)
Verbal order faxed over to continue services for 2 weeks

## 2016-03-08 NOTE — Telephone Encounter (Signed)
Katrina Berry from [facility] is calling asking if she can get a verbal order to continue PT for [facility] for 2 more weeks, please advise?

## 2016-03-09 DIAGNOSIS — I959 Hypotension, unspecified: Secondary | ICD-10-CM | POA: Diagnosis not present

## 2016-03-09 DIAGNOSIS — R001 Bradycardia, unspecified: Secondary | ICD-10-CM | POA: Diagnosis not present

## 2016-03-09 DIAGNOSIS — W19XXXD Unspecified fall, subsequent encounter: Secondary | ICD-10-CM | POA: Diagnosis not present

## 2016-03-09 DIAGNOSIS — M1990 Unspecified osteoarthritis, unspecified site: Secondary | ICD-10-CM | POA: Diagnosis not present

## 2016-03-09 DIAGNOSIS — R531 Weakness: Secondary | ICD-10-CM | POA: Diagnosis not present

## 2016-03-09 DIAGNOSIS — F0280 Dementia in other diseases classified elsewhere without behavioral disturbance: Secondary | ICD-10-CM | POA: Diagnosis not present

## 2016-03-13 DIAGNOSIS — W19XXXD Unspecified fall, subsequent encounter: Secondary | ICD-10-CM | POA: Diagnosis not present

## 2016-03-13 DIAGNOSIS — I739 Peripheral vascular disease, unspecified: Secondary | ICD-10-CM | POA: Diagnosis not present

## 2016-03-13 DIAGNOSIS — I959 Hypotension, unspecified: Secondary | ICD-10-CM | POA: Diagnosis not present

## 2016-03-13 DIAGNOSIS — R531 Weakness: Secondary | ICD-10-CM | POA: Diagnosis not present

## 2016-03-13 DIAGNOSIS — L851 Acquired keratosis [keratoderma] palmaris et plantaris: Secondary | ICD-10-CM | POA: Diagnosis not present

## 2016-03-13 DIAGNOSIS — B351 Tinea unguium: Secondary | ICD-10-CM | POA: Diagnosis not present

## 2016-03-13 DIAGNOSIS — M1990 Unspecified osteoarthritis, unspecified site: Secondary | ICD-10-CM | POA: Diagnosis not present

## 2016-03-13 DIAGNOSIS — R001 Bradycardia, unspecified: Secondary | ICD-10-CM | POA: Diagnosis not present

## 2016-03-13 DIAGNOSIS — F0280 Dementia in other diseases classified elsewhere without behavioral disturbance: Secondary | ICD-10-CM | POA: Diagnosis not present

## 2016-03-14 DIAGNOSIS — R531 Weakness: Secondary | ICD-10-CM | POA: Diagnosis not present

## 2016-03-14 DIAGNOSIS — R001 Bradycardia, unspecified: Secondary | ICD-10-CM | POA: Diagnosis not present

## 2016-03-14 DIAGNOSIS — F0280 Dementia in other diseases classified elsewhere without behavioral disturbance: Secondary | ICD-10-CM | POA: Diagnosis not present

## 2016-03-14 DIAGNOSIS — W19XXXD Unspecified fall, subsequent encounter: Secondary | ICD-10-CM | POA: Diagnosis not present

## 2016-03-14 DIAGNOSIS — M1990 Unspecified osteoarthritis, unspecified site: Secondary | ICD-10-CM | POA: Diagnosis not present

## 2016-03-14 DIAGNOSIS — I959 Hypotension, unspecified: Secondary | ICD-10-CM | POA: Diagnosis not present

## 2016-03-15 DIAGNOSIS — R001 Bradycardia, unspecified: Secondary | ICD-10-CM | POA: Diagnosis not present

## 2016-03-15 DIAGNOSIS — R531 Weakness: Secondary | ICD-10-CM | POA: Diagnosis not present

## 2016-03-15 DIAGNOSIS — W19XXXD Unspecified fall, subsequent encounter: Secondary | ICD-10-CM | POA: Diagnosis not present

## 2016-03-15 DIAGNOSIS — M1990 Unspecified osteoarthritis, unspecified site: Secondary | ICD-10-CM | POA: Diagnosis not present

## 2016-03-15 DIAGNOSIS — F0280 Dementia in other diseases classified elsewhere without behavioral disturbance: Secondary | ICD-10-CM | POA: Diagnosis not present

## 2016-03-15 DIAGNOSIS — I959 Hypotension, unspecified: Secondary | ICD-10-CM | POA: Diagnosis not present

## 2016-03-16 DIAGNOSIS — R001 Bradycardia, unspecified: Secondary | ICD-10-CM | POA: Diagnosis not present

## 2016-03-16 DIAGNOSIS — I959 Hypotension, unspecified: Secondary | ICD-10-CM | POA: Diagnosis not present

## 2016-03-16 DIAGNOSIS — R531 Weakness: Secondary | ICD-10-CM | POA: Diagnosis not present

## 2016-03-16 DIAGNOSIS — W19XXXD Unspecified fall, subsequent encounter: Secondary | ICD-10-CM | POA: Diagnosis not present

## 2016-03-16 DIAGNOSIS — F0280 Dementia in other diseases classified elsewhere without behavioral disturbance: Secondary | ICD-10-CM | POA: Diagnosis not present

## 2016-03-16 DIAGNOSIS — M1990 Unspecified osteoarthritis, unspecified site: Secondary | ICD-10-CM | POA: Diagnosis not present

## 2016-03-19 DIAGNOSIS — F0280 Dementia in other diseases classified elsewhere without behavioral disturbance: Secondary | ICD-10-CM | POA: Diagnosis not present

## 2016-03-19 DIAGNOSIS — M1990 Unspecified osteoarthritis, unspecified site: Secondary | ICD-10-CM | POA: Diagnosis not present

## 2016-03-19 DIAGNOSIS — R531 Weakness: Secondary | ICD-10-CM | POA: Diagnosis not present

## 2016-03-19 DIAGNOSIS — W19XXXD Unspecified fall, subsequent encounter: Secondary | ICD-10-CM | POA: Diagnosis not present

## 2016-03-19 DIAGNOSIS — I959 Hypotension, unspecified: Secondary | ICD-10-CM | POA: Diagnosis not present

## 2016-03-19 DIAGNOSIS — R001 Bradycardia, unspecified: Secondary | ICD-10-CM | POA: Diagnosis not present

## 2016-03-20 DIAGNOSIS — M1990 Unspecified osteoarthritis, unspecified site: Secondary | ICD-10-CM | POA: Diagnosis not present

## 2016-03-20 DIAGNOSIS — F0280 Dementia in other diseases classified elsewhere without behavioral disturbance: Secondary | ICD-10-CM | POA: Diagnosis not present

## 2016-03-20 DIAGNOSIS — R531 Weakness: Secondary | ICD-10-CM | POA: Diagnosis not present

## 2016-03-20 DIAGNOSIS — I959 Hypotension, unspecified: Secondary | ICD-10-CM | POA: Diagnosis not present

## 2016-03-20 DIAGNOSIS — W19XXXD Unspecified fall, subsequent encounter: Secondary | ICD-10-CM | POA: Diagnosis not present

## 2016-03-20 DIAGNOSIS — R001 Bradycardia, unspecified: Secondary | ICD-10-CM | POA: Diagnosis not present

## 2016-03-21 DIAGNOSIS — M1990 Unspecified osteoarthritis, unspecified site: Secondary | ICD-10-CM | POA: Diagnosis not present

## 2016-03-21 DIAGNOSIS — W19XXXD Unspecified fall, subsequent encounter: Secondary | ICD-10-CM | POA: Diagnosis not present

## 2016-03-21 DIAGNOSIS — I959 Hypotension, unspecified: Secondary | ICD-10-CM | POA: Diagnosis not present

## 2016-03-21 DIAGNOSIS — R001 Bradycardia, unspecified: Secondary | ICD-10-CM | POA: Diagnosis not present

## 2016-03-21 DIAGNOSIS — F0280 Dementia in other diseases classified elsewhere without behavioral disturbance: Secondary | ICD-10-CM | POA: Diagnosis not present

## 2016-03-21 DIAGNOSIS — R531 Weakness: Secondary | ICD-10-CM | POA: Diagnosis not present

## 2016-04-04 DIAGNOSIS — I959 Hypotension, unspecified: Secondary | ICD-10-CM | POA: Diagnosis not present

## 2016-04-04 DIAGNOSIS — M1990 Unspecified osteoarthritis, unspecified site: Secondary | ICD-10-CM | POA: Diagnosis not present

## 2016-04-04 DIAGNOSIS — F0280 Dementia in other diseases classified elsewhere without behavioral disturbance: Secondary | ICD-10-CM | POA: Diagnosis not present

## 2016-04-04 DIAGNOSIS — R001 Bradycardia, unspecified: Secondary | ICD-10-CM | POA: Diagnosis not present

## 2016-04-04 DIAGNOSIS — R531 Weakness: Secondary | ICD-10-CM | POA: Diagnosis not present

## 2016-04-04 DIAGNOSIS — W19XXXD Unspecified fall, subsequent encounter: Secondary | ICD-10-CM | POA: Diagnosis not present

## 2016-05-10 ENCOUNTER — Ambulatory Visit (INDEPENDENT_AMBULATORY_CARE_PROVIDER_SITE_OTHER): Payer: Medicare Other

## 2016-05-10 ENCOUNTER — Telehealth: Payer: Self-pay | Admitting: Family Medicine

## 2016-05-10 VITALS — BP 126/78 | HR 60 | Resp 18 | Ht 60.0 in | Wt 157.1 lb

## 2016-05-10 DIAGNOSIS — Z23 Encounter for immunization: Secondary | ICD-10-CM | POA: Diagnosis not present

## 2016-05-10 DIAGNOSIS — E785 Hyperlipidemia, unspecified: Secondary | ICD-10-CM

## 2016-05-10 DIAGNOSIS — E559 Vitamin D deficiency, unspecified: Secondary | ICD-10-CM

## 2016-05-10 DIAGNOSIS — Z0000 Encounter for general adult medical examination without abnormal findings: Secondary | ICD-10-CM | POA: Diagnosis not present

## 2016-05-10 DIAGNOSIS — I10 Essential (primary) hypertension: Secondary | ICD-10-CM

## 2016-05-10 NOTE — Assessment & Plan Note (Signed)
After obtaining informed consent, the vaccine is  administered by LPN.  

## 2016-05-10 NOTE — Progress Notes (Signed)
Subjective:    Katrina Berry is a 82 y.o. female who presents for Medicare Annual/Subsequent preventive examination.  Preventive Screening-Counseling & Management  Tobacco History  Smoking Status  . Never Smoker  Smokeless Tobacco  . Never Used    Current Problems (verified) Patient Active Problem List   Diagnosis Date Noted  . Hyperlipidemia LDL goal <130 08/07/2015  . Vitamin D deficiency 08/07/2015  . Essential hypertension 07/07/2015  . Dementia 07/07/2015  . Shoulder pain, right 07/07/2015    Medications Prior to Visit Current Outpatient Prescriptions on File Prior to Visit  Medication Sig Dispense Refill  . acetaminophen (TYLENOL) 500 MG tablet Take 500 mg by mouth every 6 (six) hours as needed.    Marland Kitchen aspirin EC 81 MG tablet Take 1 tablet (81 mg total) by mouth daily. 30 tablet 11  . atorvastatin (LIPITOR) 10 MG tablet TAKE 1 TABLET (10 MG TOTAL) BY MOUTH DAILY. 30 tablet 5  . donepezil (ARICEPT) 10 MG tablet TAKE 1 TABLET (10 MG TOTAL) BY MOUTH AT BEDTIME. 30 tablet 4  . losartan (COZAAR) 100 MG tablet TAKE 1 TABLET (100 MG TOTAL) BY MOUTH DAILY. 30 tablet 4  . Vitamin D, Ergocalciferol, (DRISDOL) 50000 units CAPS capsule TAKE 1 CAPSULE (50,000 UNITS TOTAL) BY MOUTH EVERY 7 (SEVEN) DAYS. 4 capsule 5   No current facility-administered medications on file prior to visit.     Current Medications (verified) Current Outpatient Prescriptions  Medication Sig Dispense Refill  . acetaminophen (TYLENOL) 500 MG tablet Take 500 mg by mouth every 6 (six) hours as needed.    Marland Kitchen aspirin EC 81 MG tablet Take 1 tablet (81 mg total) by mouth daily. 30 tablet 11  . atorvastatin (LIPITOR) 10 MG tablet TAKE 1 TABLET (10 MG TOTAL) BY MOUTH DAILY. 30 tablet 5  . donepezil (ARICEPT) 10 MG tablet TAKE 1 TABLET (10 MG TOTAL) BY MOUTH AT BEDTIME. 30 tablet 4  . losartan (COZAAR) 100 MG tablet TAKE 1 TABLET (100 MG TOTAL) BY MOUTH DAILY. 30 tablet 4  . Vitamin D, Ergocalciferol, (DRISDOL)  50000 units CAPS capsule TAKE 1 CAPSULE (50,000 UNITS TOTAL) BY MOUTH EVERY 7 (SEVEN) DAYS. 4 capsule 5   No current facility-administered medications for this visit.      Allergies (verified) Review of patient's allergies indicates no known allergies.   PAST HISTORY  Family History Family History  Problem Relation Age of Onset  . Diabetes Sister   . Hypertension Sister     Social History Social History  Substance Use Topics  . Smoking status: Never Smoker  . Smokeless tobacco: Never Used  . Alcohol use No     Are there smokers in your home (other than you)? No  Risk Factors Current exercise habits: The patient does not participate in regular exercise at present.  Dietary issues discussed: Encouraged patient to limit sweets and snack foods   Cardiac risk factors: advanced age (older than 25 for men, 65 for women), hypertension, obesity (BMI >= 30 kg/m2) and sedentary lifestyle.  Depression Screen (Note: if answer to either of the following is "Yes", a more complete depression screening is indicated)   Over the past two weeks, have you felt down, depressed or hopeless? No  Over the past two weeks, have you felt little interest or pleasure in doing things? No  Have you lost interest or pleasure in daily life? No  Do you often feel hopeless? No  Do you cry easily over simple problems? No  Activities of  Daily Living In your present state of health, do you have any difficulty performing the following activities?:  Driving? Yes Managing money?  Yes Feeding yourself? No Getting from bed to chair? YesNo exam performed today, annual wellness visit without physical exam. Climbing a flight of stairs? Yes Preparing food and eating?: preparing food yes eating no Bathing or showering? Yes Getting dressed: Yes Getting to the toilet? Yes Using the toilet:No Moving around from place to place: Yes In the past year have you fallen or had a near fall?:Yes   Are you sexually  active?  No  Do you have more than one partner?  No  Hearing Difficulties: No Do you often ask people to speak up or repeat themselves? No Do you experience ringing or noises in your ears? No Do you have difficulty understanding soft or whispered voices? No   Do you feel that you have a problem with memory? Yes  Do you often misplace items? Yes  Do you feel safe at home?  Yes  Cognitive Testing  Alert? Yes  Normal Appearance?Yes  Oriented to person? Yes  Place? No not oriented to person, severe dementia  Time? No  Recall of three objects?  No  Can perform simple calculations? No  Displays appropriate judgment?Yes, incapable  Can read the correct time from a watch face?Yes   Advanced Directives have been discussed with the patient? Yes  List the Names of Other Physician/Practitioners you currently use: 1.  No additional   Indicate any recent Medical Services you may have received from other than [facility] providers in the past year (date may be approximate).  Immunization History  Administered Date(s) Administered  . Influenza,inj,Quad PF,36+ Mos 07/07/2015, 05/10/2016  . Tdap 07/07/2015    Screening Tests Health Maintenance  Topic Date Due  . DEXA SCAN  10/31/1984  . PNA vac Low Risk Adult (1 of 2 - PCV13) 10/31/1984  . INFLUENZA VACCINE  03/20/2016  . ZOSTAVAX  11/27/2017 (Originally 11/01/1979)  . TETANUS/TDAP  07/06/2025    All answers were reviewed with the patient and necessary referrals were made:  Vanetta Mulders, LPN   [id number]   History reviewed: allergies, current medications, past family history, past medical history, past social history, past surgical history and problem list  Review of Systems A comprehensive review of systems was negative.    Objective:     Vision by Snellen chart: right [id number] to measure, left [id number] to measure  Body mass index is 30.68 kg/m. BP 126/78   Pulse 60   Resp 18   Ht 5' (1.524 m)   Wt 157 lb 1.3 oz  (71.3 kg)   SpO2 97%   BMI 30.68 kg/m   No exam performed today, annual wellness visit without physical exam     Assessment:  Medicare annual wellness visit, subsequent Annual exam as documented. . Immunization needs are specifically addressed at this visit.   Need for prophylactic vaccination and inoculation against influenza After obtaining informed consent, the vaccine is  administered by LPN.       Plan:     During the course of the visit the patient was educated and counseled about appropriate screening and preventive services including:    Influenza vaccine  Diet review for nutrition referral? Yes ____  Not Indicated _x___   Patient Instructions (the written plan) was given to the patient.  Medicare Attestation I have personally reviewed: The patient's medical and social history Their use of alcohol, tobacco or illicit drugs Their  current medications and supplements The patient's functional ability including ADLs,fall risks, home safety risks, cognitive, and hearing and visual impairment Diet and physical activities Evidence for depression or mood disorders  The patient's weight, height, BMI, and visual acuity have been recorded in the chart.  I have made referrals, counseling, and provided education to the patient based on review of the above and I have provided the patient with a written personalized care plan for preventive services.     Denman George [place], [place]   [id number]

## 2016-05-10 NOTE — Assessment & Plan Note (Addendum)
Annual exam as documented.  Immunization needs are specifically addressed at this visit.  

## 2016-05-10 NOTE — Patient Instructions (Signed)
Thank you for choosing [facility] for your health care needs  The Annual Wellness Visit is designed to allow [place] the chance to assist you in preserving and improving you health.   Dr. Moshe Cipro will see you in 4 months for a follow up visit  Labs will be mailed to niece to have done before visit  Please call with any questions or concerns.

## 2016-05-10 NOTE — Telephone Encounter (Signed)
Needs fasting ;lipid, cmp and Vit D in 4 months, 1 week prior to f/iu pls orderand mail

## 2016-05-11 NOTE — Addendum Note (Signed)
Addended by: Eual Fines on: 05/11/2016 09:01 AM   Modules accepted: Orders

## 2016-05-11 NOTE — Telephone Encounter (Signed)
Will mail lab order to Melanee Left, her niece

## 2016-05-16 ENCOUNTER — Other Ambulatory Visit: Payer: Self-pay | Admitting: Family Medicine

## 2016-05-22 DIAGNOSIS — L851 Acquired keratosis [keratoderma] palmaris et plantaris: Secondary | ICD-10-CM | POA: Diagnosis not present

## 2016-05-22 DIAGNOSIS — B351 Tinea unguium: Secondary | ICD-10-CM | POA: Diagnosis not present

## 2016-05-22 DIAGNOSIS — I739 Peripheral vascular disease, unspecified: Secondary | ICD-10-CM | POA: Diagnosis not present

## 2016-06-12 ENCOUNTER — Other Ambulatory Visit: Payer: Self-pay | Admitting: Family Medicine

## 2016-07-05 ENCOUNTER — Other Ambulatory Visit: Payer: Self-pay | Admitting: Family Medicine

## 2016-07-31 DIAGNOSIS — I739 Peripheral vascular disease, unspecified: Secondary | ICD-10-CM | POA: Diagnosis not present

## 2016-07-31 DIAGNOSIS — L851 Acquired keratosis [keratoderma] palmaris et plantaris: Secondary | ICD-10-CM | POA: Diagnosis not present

## 2016-07-31 DIAGNOSIS — B351 Tinea unguium: Secondary | ICD-10-CM | POA: Diagnosis not present

## 2016-09-25 ENCOUNTER — Ambulatory Visit: Payer: Medicare Other | Admitting: Family Medicine

## 2016-10-17 ENCOUNTER — Other Ambulatory Visit: Payer: Self-pay | Admitting: Family Medicine

## 2016-11-08 ENCOUNTER — Telehealth: Payer: Self-pay

## 2016-11-08 DIAGNOSIS — E559 Vitamin D deficiency, unspecified: Secondary | ICD-10-CM

## 2016-11-08 DIAGNOSIS — I10 Essential (primary) hypertension: Secondary | ICD-10-CM

## 2016-11-08 DIAGNOSIS — E785 Hyperlipidemia, unspecified: Secondary | ICD-10-CM

## 2016-11-08 NOTE — Telephone Encounter (Signed)
Lab orders updated

## 2016-11-16 ENCOUNTER — Emergency Department (HOSPITAL_COMMUNITY): Payer: Medicare Other

## 2016-11-16 ENCOUNTER — Emergency Department (HOSPITAL_COMMUNITY)
Admission: EM | Admit: 2016-11-16 | Discharge: 2016-11-16 | Disposition: A | Discharge status: Home / Self Care | Payer: Medicare Other | Attending: Emergency Medicine | Admitting: Emergency Medicine

## 2016-11-16 ENCOUNTER — Encounter (HOSPITAL_COMMUNITY): Payer: Self-pay | Admitting: Emergency Medicine

## 2016-11-16 DIAGNOSIS — I10 Essential (primary) hypertension: Secondary | ICD-10-CM | POA: Diagnosis not present

## 2016-11-16 DIAGNOSIS — K5730 Diverticulosis of large intestine without perforation or abscess without bleeding: Secondary | ICD-10-CM | POA: Insufficient documentation

## 2016-11-16 DIAGNOSIS — J984 Other disorders of lung: Secondary | ICD-10-CM | POA: Insufficient documentation

## 2016-11-16 DIAGNOSIS — Z7982 Long term (current) use of aspirin: Secondary | ICD-10-CM | POA: Diagnosis not present

## 2016-11-16 DIAGNOSIS — R4182 Altered mental status, unspecified: Secondary | ICD-10-CM | POA: Diagnosis not present

## 2016-11-16 DIAGNOSIS — J9811 Atelectasis: Secondary | ICD-10-CM | POA: Diagnosis not present

## 2016-11-16 DIAGNOSIS — E860 Dehydration: Secondary | ICD-10-CM

## 2016-11-16 DIAGNOSIS — Z79899 Other long term (current) drug therapy: Secondary | ICD-10-CM | POA: Insufficient documentation

## 2016-11-16 LAB — CBC WITH DIFFERENTIAL/PLATELET
Basophils Absolute: 0 10*3/uL (ref 0.0–0.1)
Basophils Relative: 0 %
Eosinophils Absolute: 0.1 10*3/uL (ref 0.0–0.7)
Eosinophils Relative: 1 %
HEMATOCRIT: 36.3 % (ref 36.0–46.0)
HEMOGLOBIN: 11.4 g/dL — AB (ref 12.0–15.0)
LYMPHS ABS: 2.3 10*3/uL (ref 0.7–4.0)
LYMPHS PCT: 27 %
MCH: 30 pg (ref 26.0–34.0)
MCHC: 31.4 g/dL (ref 30.0–36.0)
MCV: 95.5 fL (ref 78.0–100.0)
Monocytes Absolute: 0.7 10*3/uL (ref 0.1–1.0)
Monocytes Relative: 8 %
NEUTROS ABS: 5.5 10*3/uL (ref 1.7–7.7)
Neutrophils Relative %: 64 %
Platelets: 228 10*3/uL (ref 150–400)
RBC: 3.8 MIL/uL — AB (ref 3.87–5.11)
RDW: 14.6 % (ref 11.5–15.5)
WBC: 8.6 10*3/uL (ref 4.0–10.5)

## 2016-11-16 LAB — URINALYSIS, ROUTINE W REFLEX MICROSCOPIC
Bilirubin Urine: NEGATIVE
Glucose, UA: NEGATIVE mg/dL
Ketones, ur: NEGATIVE mg/dL
Leukocytes, UA: NEGATIVE
NITRITE: NEGATIVE
PH: 5 (ref 5.0–8.0)
Protein, ur: NEGATIVE mg/dL
SPECIFIC GRAVITY, URINE: 1.015 (ref 1.005–1.030)

## 2016-11-16 LAB — COMPREHENSIVE METABOLIC PANEL
ALK PHOS: 55 U/L (ref 38–126)
ALT: 13 U/L — AB (ref 14–54)
AST: 23 U/L (ref 15–41)
Albumin: 3.8 g/dL (ref 3.5–5.0)
Anion gap: 8 (ref 5–15)
BUN: 14 mg/dL (ref 6–20)
CALCIUM: 9.6 mg/dL (ref 8.9–10.3)
CO2: 28 mmol/L (ref 22–32)
CREATININE: 1 mg/dL (ref 0.44–1.00)
Chloride: 106 mmol/L (ref 101–111)
GFR, EST AFRICAN AMERICAN: 53 mL/min — AB (ref >60)
GFR, EST NON AFRICAN AMERICAN: 46 mL/min — AB (ref >60)
Glucose, Bld: 100 mg/dL — ABNORMAL HIGH (ref 65–99)
Potassium: 3.7 mmol/L (ref 3.5–5.1)
SODIUM: 142 mmol/L (ref 135–145)
Total Bilirubin: 0.7 mg/dL (ref 0.3–1.2)
Total Protein: 7.6 g/dL (ref 6.5–8.1)

## 2016-11-16 LAB — LIPASE, BLOOD: Lipase: 19 U/L (ref 11–51)

## 2016-11-16 MED ORDER — SODIUM CHLORIDE 0.9 % IV BOLUS (SEPSIS)
500.0000 mL | Freq: Once | INTRAVENOUS | Status: AC
  Administered 2016-11-16: 500 mL via INTRAVENOUS

## 2016-11-16 MED ORDER — SODIUM CHLORIDE 0.9 % IV SOLN
INTRAVENOUS | Status: DC
  Administered 2016-11-16: 20:00:00 via INTRAVENOUS

## 2016-11-16 NOTE — Discharge Instructions (Signed)
Workup without significant findings. Dr. Moshe Cipro know there was some swelling noted on the CT scan around the pancreas but her pancreatic enzyme lipase was normal. Return for any new or worse symptoms.

## 2016-11-16 NOTE — ED Notes (Signed)
Pt's daughter believes that pt is dehydrated, pt not eating as much and taking in very little PO fluids.

## 2016-11-16 NOTE — ED Triage Notes (Signed)
Patient's daughter states patient has not been drinking as much as usual. States that patient has history of dementia.

## 2016-11-16 NOTE — ED Provider Notes (Signed)
[facility] DEPT Provider Note   CSN: [id number] Arrival date & time: 11/16/16  1455     History   Chief Complaint Chief Complaint  Patient presents with  . Dehydration    HPI Katrina Berry is a 83 y.o. female.  Patient brought in by daughter for not eating or drinking as much as usual. Patient does have a history of dementia. They thought she just wasn't as awake as alert as usual.      Past Medical History:  Diagnosis Date  . Arthritis    mild, no known falls uses cane  . Dementia   . Hypertension     Patient Active Problem List   Diagnosis Date Noted  . Medicare annual wellness visit, subsequent 05/10/2016  . Hyperlipidemia LDL goal <130 08/07/2015  . Vitamin D deficiency 08/07/2015  . Essential hypertension 07/07/2015  . Dementia 07/07/2015  . Need for prophylactic vaccination and inoculation against influenza 07/07/2015  . Shoulder pain, right 07/07/2015    History reviewed. No pertinent surgical history.  OB History    No data available       Home Medications    Prior to Admission medications   Medication Sig Start Date End Date Taking? Authorizing Provider  acetaminophen (TYLENOL) 500 MG tablet Take 500 mg by mouth every 6 (six) hours as needed.   Yes Historical Provider, MD  aspirin EC 81 MG tablet Take 1 tablet (81 mg total) by mouth daily. 07/07/15  Yes Fayrene Helper, MD  atorvastatin (LIPITOR) 10 MG tablet TAKE 1 TABLET (10 MG TOTAL) BY MOUTH DAILY. 07/05/16  Yes Fayrene Helper, MD  cetirizine (ZYRTEC) 10 MG tablet Take 10 mg by mouth daily.   Yes Historical Provider, MD  donepezil (ARICEPT) 10 MG tablet TAKE 1 TABLET (10 MG TOTAL) BY MOUTH AT BEDTIME. 10/17/16  Yes Fayrene Helper, MD  guaiFENesin-dextromethorphan (ROBITUSSIN DM) 100-10 MG/5ML syrup Take 5 mLs by mouth every 4 (four) hours as needed for cough.   Yes Historical Provider, MD  losartan (COZAAR) 100 MG tablet TAKE 1 TABLET (100 MG TOTAL) BY MOUTH DAILY. 10/17/16   Yes Fayrene Helper, MD  Vitamin D, Ergocalciferol, (DRISDOL) 50000 units CAPS capsule TAKE 1 CAPSULE (50,000 UNITS TOTAL) BY MOUTH EVERY 7 (SEVEN) DAYS. 06/13/16  Yes Fayrene Helper, MD    Family History Family History  Problem Relation Age of Onset  . Diabetes Sister   . Hypertension Sister     Social History Social History  Substance Use Topics  . Smoking status: Never Smoker  . Smokeless tobacco: Never Used  . Alcohol use No     Allergies   Patient has no known allergies.   Review of Systems Review of Systems  Unable to perform ROS: Dementia  Constitutional: Positive for appetite change.     Physical Exam Updated Vital Signs BP (!) 168/59   Pulse (!) 56   Temp 99.2 F (37.3 C) (Oral)   Resp 16   Wt 71.2 kg   SpO2 99%   BMI 30.66 kg/m   Physical Exam  Constitutional: She appears well-developed and well-nourished. No distress.  HENT:  Head: Normocephalic and atraumatic.  Mouth/Throat: Oropharynx is clear and moist.  Eyes: Conjunctivae and EOM are normal. Pupils are equal, round, and reactive to light.  Neck: Normal range of motion. Neck supple.  Cardiovascular: Normal rate and regular rhythm.   Pulmonary/Chest: Effort normal and breath sounds normal. No respiratory distress.  Abdominal: Soft. Bowel sounds are normal. There  is no tenderness.  Musculoskeletal: Normal range of motion. She exhibits no edema.  Neurological: She is alert. No cranial nerve deficit or sensory deficit. She exhibits normal muscle tone. Coordination normal.  Skin: Skin is warm. No rash noted.  Nursing note and vitals reviewed.    ED Treatments / Results  Labs (all labs ordered are listed, but only abnormal results are displayed) Labs Reviewed  CBC WITH DIFFERENTIAL/PLATELET - Abnormal; Notable for the following:       Result Value   RBC 3.80 (*)    Hemoglobin 11.4 (*)    All other components within normal limits  COMPREHENSIVE METABOLIC PANEL - Abnormal; Notable for  the following:    Glucose, Bld 100 (*)    ALT 13 (*)    GFR calc non Af Amer 46 (*)    GFR calc Af Amer 53 (*)    All other components within normal limits  URINALYSIS, ROUTINE W REFLEX MICROSCOPIC - Abnormal; Notable for the following:    Hgb urine dipstick SMALL (*)    Bacteria, UA RARE (*)    Squamous Epithelial / LPF 0-5 (*)    All other components within normal limits  LIPASE, BLOOD    EKG  EKG Interpretation  Date/Time:  Friday November 16 2016 18:01:17 EDT Ventricular Rate:  45 PR Interval:    QRS Duration: 82 QT Interval:  465 QTC Calculation: 403 R Axis:   29 Text Interpretation:  Sinus bradycardia No significant change since last tracing Confirmed by Micaila Ziemba  MD, Domanic Matusek [phone]) on 11/16/2016 7:54:36 PM       Radiology Ct Abdomen Pelvis Wo Contrast  Result Date: 11/16/2016 CLINICAL DATA:  Acute onset of altered mental status. Dehydration. Initial encounter. EXAM: CT CHEST, ABDOMEN AND PELVIS WITHOUT CONTRAST TECHNIQUE: Multidetector CT imaging of the chest, abdomen and pelvis was performed following the standard protocol without IV contrast. COMPARISON:  None. FINDINGS: CT CHEST FINDINGS Cardiovascular: The heart is normal in size, with diffuse coronary artery calcifications. Scattered calcification is seen along the thoracic aorta and proximal great vessels. Mediastinum/Nodes: The mediastinum is otherwise unremarkable. No mediastinal lymphadenopathy is seen. No pericardial effusion is identified. The thyroid gland is unremarkable. No axillary lymphadenopathy is seen. Lungs/Pleura: Minimal bibasilar scarring is noted. No pleural effusion or pneumothorax is seen. No pleural effusion or pneumothorax is seen. No masses are identified. Musculoskeletal: No acute osseous abnormalities are identified. The visualized musculature is unremarkable in appearance. CT ABDOMEN PELVIS FINDINGS Hepatobiliary: The liver is unremarkable in appearance. The gallbladder is unremarkable in appearance.  The common bile duct remains normal in caliber. Pancreas: There is question of minimal soft tissue inflammation about the pancreas. Would correlate with pancreatic labs to exclude pancreatitis. Spleen: The spleen is unremarkable in appearance. Adrenals/Urinary Tract: The adrenal glands are unremarkable in appearance. The kidneys are within normal limits. There is no evidence of hydronephrosis. No renal or ureteral stones are identified. No perinephric stranding is seen. Stomach/Bowel: The stomach is unremarkable in appearance. The small bowel is within normal limits. The appendix is normal in caliber, without evidence of appendicitis. Scattered diverticulosis is noted along the distal descending and sigmoid colon, without evidence of diverticulitis. Vascular/Lymphatic: Scattered calcification is seen along the abdominal aorta and its branches. The abdominal aorta is otherwise grossly unremarkable. The inferior vena cava is grossly unremarkable. No retroperitoneal lymphadenopathy is seen. No pelvic sidewall lymphadenopathy is identified. Reproductive: The bladder is decompressed and not well assessed. The uterus is grossly unremarkable. The ovaries are relatively symmetric. No  suspicious adnexal masses are seen. Other: No additional soft tissue abnormalities are seen. Musculoskeletal: No acute osseous abnormalities are identified. There is grade 1 anterolisthesis of L5 on S1. Multilevel vacuum phenomenon is noted along the thoracic and lumbar spine. The visualized musculature is unremarkable in appearance. IMPRESSION: 1. Question of minimal soft tissue inflammation about the pancreas. Would correlate with pancreatic lab values, to exclude pancreatitis. 2. Diffuse coronary artery calcification noted. 3. Minimal bibasilar atelectasis noted.  Lungs otherwise clear. 4. Scattered diverticulosis along the distal descending and sigmoid colon, without evidence diverticulitis. 5. Scattered aortic atherosclerosis. 6. Mild  degenerative change along the thoracic and lumbar spine. Electronically Signed   By: Garald Balding M.D.   On: 11/16/2016 20:44   Ct Head Wo Contrast  Result Date: 11/16/2016 CLINICAL DATA:  Altered mental status.  Dehydration. EXAM: CT HEAD WITHOUT CONTRAST TECHNIQUE: Contiguous axial images were obtained from the base of the skull through the vertex without intravenous contrast. COMPARISON:  None. FINDINGS: Brain: The ventricles are normal in configuration. There is ventricular and sulcal enlargement reflecting moderate diffuse atrophy. No hydrocephalus. There are no parenchymal masses or mass effect. There is no evidence of a recent infarct. Patchy white matter hypoattenuation is noted consistent with moderate chronic microvascular ischemic change. There are no extra-axial masses or abnormal fluid collections. There is no intracranial hemorrhage. Vascular: No hyperdense vessel or unexpected calcification. Skull: Normal. Negative for fracture or focal lesion. Sinuses/Orbits: Visualized right maxillary sinus is opacified. Right ethmoid air cells are mostly opacified. Remaining visualized sinuses are clear as are the mastoid air cells. Globes and orbits are unremarkable. Other: None IMPRESSION: 1. No acute intracranial abnormalities. 2. Moderate atrophy and chronic microvascular ischemic change. 3. Sinus disease with an opacified right maxillary sinus and partial opacification of the right ethmoid air cells. Electronically Signed   By: Lajean Manes M.D.   On: 11/16/2016 20:39   Ct Chest Wo Contrast  Result Date: 11/16/2016 CLINICAL DATA:  Acute onset of altered mental status. Dehydration. Initial encounter. EXAM: CT CHEST, ABDOMEN AND PELVIS WITHOUT CONTRAST TECHNIQUE: Multidetector CT imaging of the chest, abdomen and pelvis was performed following the standard protocol without IV contrast. COMPARISON:  None. FINDINGS: CT CHEST FINDINGS Cardiovascular: The heart is normal in size, with diffuse coronary  artery calcifications. Scattered calcification is seen along the thoracic aorta and proximal great vessels. Mediastinum/Nodes: The mediastinum is otherwise unremarkable. No mediastinal lymphadenopathy is seen. No pericardial effusion is identified. The thyroid gland is unremarkable. No axillary lymphadenopathy is seen. Lungs/Pleura: Minimal bibasilar scarring is noted. No pleural effusion or pneumothorax is seen. No pleural effusion or pneumothorax is seen. No masses are identified. Musculoskeletal: No acute osseous abnormalities are identified. The visualized musculature is unremarkable in appearance. CT ABDOMEN PELVIS FINDINGS Hepatobiliary: The liver is unremarkable in appearance. The gallbladder is unremarkable in appearance. The common bile duct remains normal in caliber. Pancreas: There is question of minimal soft tissue inflammation about the pancreas. Would correlate with pancreatic labs to exclude pancreatitis. Spleen: The spleen is unremarkable in appearance. Adrenals/Urinary Tract: The adrenal glands are unremarkable in appearance. The kidneys are within normal limits. There is no evidence of hydronephrosis. No renal or ureteral stones are identified. No perinephric stranding is seen. Stomach/Bowel: The stomach is unremarkable in appearance. The small bowel is within normal limits. The appendix is normal in caliber, without evidence of appendicitis. Scattered diverticulosis is noted along the distal descending and sigmoid colon, without evidence of diverticulitis. Vascular/Lymphatic: Scattered calcification is seen along the  abdominal aorta and its branches. The abdominal aorta is otherwise grossly unremarkable. The inferior vena cava is grossly unremarkable. No retroperitoneal lymphadenopathy is seen. No pelvic sidewall lymphadenopathy is identified. Reproductive: The bladder is decompressed and not well assessed. The uterus is grossly unremarkable. The ovaries are relatively symmetric. No suspicious  adnexal masses are seen. Other: No additional soft tissue abnormalities are seen. Musculoskeletal: No acute osseous abnormalities are identified. There is grade 1 anterolisthesis of L5 on S1. Multilevel vacuum phenomenon is noted along the thoracic and lumbar spine. The visualized musculature is unremarkable in appearance. IMPRESSION: 1. Question of minimal soft tissue inflammation about the pancreas. Would correlate with pancreatic lab values, to exclude pancreatitis. 2. Diffuse coronary artery calcification noted. 3. Minimal bibasilar atelectasis noted.  Lungs otherwise clear. 4. Scattered diverticulosis along the distal descending and sigmoid colon, without evidence diverticulitis. 5. Scattered aortic atherosclerosis. 6. Mild degenerative change along the thoracic and lumbar spine. Electronically Signed   By: Garald Balding M.D.   On: 11/16/2016 20:44    Procedures Procedures (including critical care time)  Medications Ordered in ED Medications  0.9 %  sodium chloride infusion ( Intravenous New Bag/Given 11/16/16 2013)  sodium chloride 0.9 % bolus 500 mL (0 mLs Intravenous Stopped 11/16/16 2009)     Initial Impression / Assessment and Plan / ED Course  I have reviewed the triage vital signs and the nursing notes.  Pertinent labs & imaging results that were available during my care of the patient were reviewed by me and considered in my medical decision making (see chart for details).     Patient brought in by family member. Daughter stated the patient wasn't drinking very much. She does have a history of dementia but she seemed to be a little less alert than usual.  Workup here without any acute findings. Patient did receive some IV hydration. Does seem to be improved. CT scan of head chest abdomen and pelvis was done because it was convenient since CT of head was artery being done. No significant findings other than some inflammation around the pancreas but lipase is normal. Patient stable  for discharge home and follow-up with her regular doctor in follow-up for the pancreatic swelling.  Final Clinical Impressions(s) / ED Diagnoses   Final diagnoses:  Dehydration    New Prescriptions New Prescriptions   No medications on file     Fredia Sorrow, MD 11/16/16 2226

## 2016-11-16 NOTE — ED Notes (Signed)
Pt  in CT/xray at this time.  

## 2016-11-16 NOTE — ED Notes (Signed)
Pt placed on bedpan

## 2016-11-16 NOTE — ED Notes (Signed)
Pt given ginerale per Dr Venita Sheffield okay

## 2016-11-20 ENCOUNTER — Other Ambulatory Visit (HOSPITAL_COMMUNITY): Payer: Self-pay | Admitting: Podiatry

## 2016-11-20 DIAGNOSIS — B351 Tinea unguium: Secondary | ICD-10-CM | POA: Diagnosis not present

## 2016-11-20 DIAGNOSIS — I739 Peripheral vascular disease, unspecified: Secondary | ICD-10-CM

## 2016-11-20 DIAGNOSIS — L851 Acquired keratosis [keratoderma] palmaris et plantaris: Secondary | ICD-10-CM | POA: Diagnosis not present

## 2016-11-21 ENCOUNTER — Encounter: Payer: Self-pay | Admitting: Family Medicine

## 2016-11-21 ENCOUNTER — Ambulatory Visit (INDEPENDENT_AMBULATORY_CARE_PROVIDER_SITE_OTHER): Payer: Medicare Other | Admitting: Family Medicine

## 2016-11-21 VITALS — BP 110/62 | HR 60 | Temp 98.8°F | Resp 20 | Ht 60.0 in

## 2016-11-21 DIAGNOSIS — F0390 Unspecified dementia without behavioral disturbance: Secondary | ICD-10-CM

## 2016-11-21 DIAGNOSIS — I10 Essential (primary) hypertension: Secondary | ICD-10-CM

## 2016-11-21 DIAGNOSIS — J302 Other seasonal allergic rhinitis: Secondary | ICD-10-CM

## 2016-11-21 DIAGNOSIS — E785 Hyperlipidemia, unspecified: Secondary | ICD-10-CM

## 2016-11-21 DIAGNOSIS — E559 Vitamin D deficiency, unspecified: Secondary | ICD-10-CM

## 2016-11-21 DIAGNOSIS — Z09 Encounter for follow-up examination after completed treatment for conditions other than malignant neoplasm: Secondary | ICD-10-CM | POA: Diagnosis not present

## 2016-11-21 MED ORDER — PREDNISONE 5 MG PO TABS
5.0000 mg | ORAL_TABLET | Freq: Two times a day (BID) | ORAL | 0 refills | Status: AC
Start: 2016-11-21 — End: 2016-11-25

## 2016-11-21 MED ORDER — BENZONATATE 100 MG PO CAPS: 100.0000 mg | ORAL_CAPSULE | Freq: Two times a day (BID) | ORAL | 0 refills | Status: DC | PRN

## 2016-11-21 MED ORDER — MONTELUKAST SODIUM 10 MG PO TABS: 10.0000 mg | ORAL_TABLET | Freq: Every day | ORAL | 3 refills | Status: DC

## 2016-11-21 NOTE — Progress Notes (Signed)
   Katrina Berry     MRN: [id number]      DOB: 1918/08/06   HPI Katrina Berry is here for followof recent ED visit for runny nose and cough, no fever , and lab and radiology data are reviewed and are  within normal. 3 day h/o deccreased mobility with increased fall risk, needs 3 people to move her , caregiver unable to care for her , and is requesting place,ment, follow up contact with SNF, however , unfortunately without medicaid , finances are insufficient for justified placement, she has severe dementia, requiring increased care , which is becoming increasingly impossible to continue    ROS History from caretaker, niece Melanee Left  Denies recent fever or chills.  Denies chest pains, palpitations and leg swelling Denies abdominal pain, nausea, vomiting,diarrhea or constipation.   Denies dysuria, frequency, hesitancy or incontinence. Increased  limitation in mobility.Requires 3 people to move her Denies headaches, seizures, numbness, or tingling.  Denies skin break down or rash.   PE  BP 110/62 (BP Location: Left Arm, Patient Position: Sitting, Cuff Size: Normal)   Pulse 60   Temp 98.8 F (37.1 C) (Temporal)   Resp 20   Ht 5' (1.524 m)   SpO2 (!) 87%   Patient alert and  Disoriented  and in no cardiopulmonary distress. Rechecked pulse ox: 94% on room air  HEENT: No facial asymmetry, EOMI,   oropharynx pink and moist.  Neck supple no JVD, no mass.  Chest: Clear to auscultation bilaterally.Decreased though adequate air entry   CVS: S1, S2 no murmurs, no S3.Regular rate.  ABD: Soft non tender.   Ext: No edema  MS: decreased  ROM spine, shoulders, hips and knees.  Skin: Intact, no ulcerations or rash noted.on visible skin  Psych: Good eye contact, normal affect. Memory loss not anxious or depressed appearing.  CNS: CN 2-12 intact, power,  normal throughout.no focal deficits noted.   Assessment & Plan  Encounter for examination following treatment at hospital F/U for  Ed visit on 11/16/2016, record , lab and radiologic data reviewed. Niece has ongoing concern about inability to adequately care for her Aunt as she is requiring more hands on care than she is able to provide, wants to know if she can be hospitalized or placed. Based on current exam and recent ED visit, hospitalization not indicated , however , placement definitely indicated and beneficial, unfortunately without medicaid , this is currently not possible, will do social work referral  Dementia Worsening , continue current meds. No behavioral issues, but increased need for care , s/W consult  Essential hypertension Controlled, no change in medication   Hyperlipidemia LDL goal <130 Hyperlipidemia:Low fat diet discussed and encouraged.   Lipid Panel  Lab Results  Component Value Date   CHOL 167 10/26/2015   HDL 74 10/26/2015   LDLCALC 72 10/26/2015   TRIG 106 10/26/2015   CHOLHDL 2.3 10/26/2015   Updated lab needed at/ before next visit.     Vitamin D deficiency transition to daily vit D3 1000 IU  Allergic rhinitis Increased and uncontrolled symptoms x 1 week, causing increased cough and nasal drainage Medication x 3 prescribed for control and symptom relief

## 2016-11-21 NOTE — Patient Instructions (Addendum)
Wellness visit Sept 22 or after, call if you need me sooner  Three  medications are sent for uncontrolled allergies causing cough and runny nose  I am trying to  Get you into a facility where you will be able to get the care that you need, I will call your niece Melanee Left at [phone] when I get a response  Please be careful not to fall  Thank you  for choosing [facility]. We consider it a privelige to serve you.  Delivering excellent health care in a caring and  compassionate way is our goal.  Partnering with you,  so that together we can achieve this goal is our strategy.

## 2016-11-22 ENCOUNTER — Ambulatory Visit: Payer: Medicare Other | Admitting: Family Medicine

## 2016-11-23 ENCOUNTER — Ambulatory Visit (HOSPITAL_COMMUNITY): Admission: RE | Admit: 2016-11-23 | Payer: Medicare Other | Source: Ambulatory Visit

## 2016-11-25 ENCOUNTER — Telehealth: Payer: Self-pay | Admitting: Family Medicine

## 2016-11-25 ENCOUNTER — Encounter: Payer: Self-pay | Admitting: Family Medicine

## 2016-11-25 DIAGNOSIS — J309 Allergic rhinitis, unspecified: Secondary | ICD-10-CM

## 2016-11-25 HISTORY — DX: Allergic rhinitis, unspecified: J30.9

## 2016-11-25 NOTE — Assessment & Plan Note (Signed)
Increased and uncontrolled symptoms x 1 week, causing increased cough and nasal drainage Medication x 3 prescribed for control and symptom relief

## 2016-11-25 NOTE — Assessment & Plan Note (Addendum)
transition to daily vit D3 1000 IU

## 2016-11-25 NOTE — Telephone Encounter (Signed)
I am asking that you reach out to patient's caregiver, Melanee Left . Pt with severe dementia requiring increased hands on attention. Niece is very committed to caring for pt but is overwhelmed and feels incapable of providing necessary care. Unfortunately , after review by [facility] center , though pt has enough co morbidity to be placed , without medicaid, the financial constraint makes this is impossible. A call from you to niece to see if there are any more feasible options would be greatly appreciated When you call , pls explain that I asked you to reach out to her as at the time of the visit , I was hoping that [facility] center would be a real option

## 2016-11-25 NOTE — Assessment & Plan Note (Signed)
F/U for Ed visit on 11/16/2016, record , lab and radiologic data reviewed. Niece has ongoing concern about inability to adequately care for her Aunt as she is requiring more hands on care than she is able to provide, wants to know if she can be hospitalized or placed. Based on current exam and recent ED visit, hospitalization not indicated , however , placement definitely indicated and beneficial, unfortunately without medicaid , this is currently not possible, will do social work referral

## 2016-11-25 NOTE — Assessment & Plan Note (Signed)
Hyperlipidemia:Low fat diet discussed and encouraged.   Lipid Panel  Lab Results  Component Value Date   CHOL 167 10/26/2015   HDL 74 10/26/2015   LDLCALC 72 10/26/2015   TRIG 106 10/26/2015   CHOLHDL 2.3 10/26/2015   Updated lab needed at/ before next visit.

## 2016-11-25 NOTE — Assessment & Plan Note (Signed)
Worsening , continue current meds. No behavioral issues, but increased need for care , s/W consult

## 2016-11-25 NOTE — Assessment & Plan Note (Signed)
Controlled, no change in medication  

## 2016-11-28 ENCOUNTER — Other Ambulatory Visit: Payer: Self-pay | Admitting: Family Medicine

## 2016-11-28 ENCOUNTER — Telehealth: Payer: Self-pay

## 2016-11-28 MED ORDER — BENZONATATE 100 MG PO CAPS: ORAL_CAPSULE | ORAL | 0 refills | Status: DC

## 2016-11-28 NOTE — Telephone Encounter (Signed)
Spoke with Katrina Berry perle renewed for as needed, use Add zyrtec daily conrtinue singulair Start OTC sudafed ione daily as needed for drippy nose

## 2016-11-28 NOTE — Telephone Encounter (Signed)
gerdia states that patient has taken all her meds given and still has a productive cough with yellow mucus and sounds as if she may be wheezing at night. Wants to know what she needs to do now. Please advise [phone]

## 2016-12-01 NOTE — Telephone Encounter (Signed)
Thanks for your help and the update

## 2016-12-07 ENCOUNTER — Other Ambulatory Visit: Payer: Self-pay | Admitting: Family Medicine

## 2016-12-07 DIAGNOSIS — E559 Vitamin D deficiency, unspecified: Secondary | ICD-10-CM | POA: Diagnosis not present

## 2016-12-07 DIAGNOSIS — E785 Hyperlipidemia, unspecified: Secondary | ICD-10-CM | POA: Diagnosis not present

## 2016-12-07 DIAGNOSIS — I10 Essential (primary) hypertension: Secondary | ICD-10-CM | POA: Diagnosis not present

## 2016-12-07 LAB — COMPREHENSIVE METABOLIC PANEL
ALBUMIN: 3.2 g/dL — AB (ref 3.6–5.1)
ALK PHOS: 60 U/L (ref 33–130)
ALT: 6 U/L (ref 6–29)
AST: 13 U/L (ref 10–35)
BUN: 11 mg/dL (ref 7–25)
CHLORIDE: 108 mmol/L (ref 98–110)
CO2: 25 mmol/L (ref 20–31)
Calcium: 9 mg/dL (ref 8.6–10.4)
Creat: 0.84 mg/dL (ref 0.60–0.88)
Glucose, Bld: 84 mg/dL (ref 65–99)
POTASSIUM: 3.8 mmol/L (ref 3.5–5.3)
Sodium: 142 mmol/L (ref 135–146)
TOTAL PROTEIN: 6 g/dL — AB (ref 6.1–8.1)
Total Bilirubin: 0.5 mg/dL (ref 0.2–1.2)

## 2016-12-07 LAB — LIPID PANEL
CHOL/HDL RATIO: 2.7 ratio (ref <5.0)
Cholesterol: 139 mg/dL (ref <200)
HDL: 52 mg/dL (ref >50)
LDL Cholesterol: 66 mg/dL (ref <100)
TRIGLYCERIDES: 105 mg/dL (ref <150)
VLDL: 21 mg/dL (ref <30)

## 2016-12-08 LAB — VITAMIN D 25 HYDROXY (VIT D DEFICIENCY, FRACTURES): Vit D, 25-Hydroxy: 68 ng/mL (ref 30–100)

## 2016-12-25 ENCOUNTER — Ambulatory Visit (INDEPENDENT_AMBULATORY_CARE_PROVIDER_SITE_OTHER): Payer: Medicare Other | Admitting: Family Medicine

## 2016-12-25 ENCOUNTER — Encounter: Payer: Self-pay | Admitting: Family Medicine

## 2016-12-25 VITALS — BP 130/80 | HR 76 | Resp 16 | Ht 60.0 in | Wt 149.0 lb

## 2016-12-25 DIAGNOSIS — M25511 Pain in right shoulder: Secondary | ICD-10-CM

## 2016-12-25 DIAGNOSIS — Z9181 History of falling: Secondary | ICD-10-CM | POA: Diagnosis not present

## 2016-12-25 DIAGNOSIS — G8929 Other chronic pain: Secondary | ICD-10-CM

## 2016-12-25 DIAGNOSIS — Z23 Encounter for immunization: Secondary | ICD-10-CM | POA: Diagnosis not present

## 2016-12-25 DIAGNOSIS — E785 Hyperlipidemia, unspecified: Secondary | ICD-10-CM | POA: Diagnosis not present

## 2016-12-25 DIAGNOSIS — I10 Essential (primary) hypertension: Secondary | ICD-10-CM

## 2016-12-25 DIAGNOSIS — F0391 Unspecified dementia with behavioral disturbance: Secondary | ICD-10-CM | POA: Diagnosis not present

## 2016-12-25 MED ORDER — PNEUMOCOCCAL 13-VAL CONJ VACC IM SUSP: 0.5000 mL | Freq: Once | INTRAMUSCULAR | Status: DC

## 2016-12-25 NOTE — Patient Instructions (Addendum)
Wellness visit September 22 or after, call if you need me before  Prevnar today  No changes in medication   Fall Prevention in the Home Falls can cause injuries. They can happen to people of all ages. There are many things you can do to make your home safe and to help prevent falls. What can I do on the outside of my home?  Regularly fix the edges of walkways and driveways and fix any cracks.  Remove anything that might make you trip as you walk through a door, such as a raised step or threshold.  Trim any bushes or trees on the path to your home.  Use bright outdoor lighting.  Clear any walking paths of anything that might make someone trip, such as rocks or tools.  Regularly check to see if handrails are loose or broken. Make sure that both sides of any steps have handrails.  Any raised decks and porches should have guardrails on the edges.  Have any leaves, snow, or ice cleared regularly.  Use sand or salt on walking paths during winter.  Clean up any spills in your garage right away. This includes oil or grease spills. What can I do in the bathroom?  Use night lights.  Install grab bars by the toilet and in the tub and shower. Do not use towel bars as grab bars.  Use non-skid mats or decals in the tub or shower.  If you need to sit down in the shower, use a plastic, non-slip stool.  Keep the floor dry. Clean up any water that spills on the floor as soon as it happens.  Remove soap buildup in the tub or shower regularly.  Attach bath mats securely with double-sided non-slip rug tape.  Do not have throw rugs and other things on the floor that can make you trip. What can I do in the bedroom?  Use night lights.  Make sure that you have a light by your bed that is easy to reach.  Do not use any sheets or blankets that are too big for your bed. They should not hang down onto the floor.  Have a firm chair that has side arms. You can use this for support while you  get dressed.  Do not have throw rugs and other things on the floor that can make you trip. What can I do in the kitchen?  Clean up any spills right away.  Avoid walking on wet floors.  Keep items that you use a lot in easy-to-reach places.  If you need to reach something above you, use a strong step stool that has a grab bar.  Keep electrical cords out of the way.  Do not use floor polish or wax that makes floors slippery. If you must use wax, use non-skid floor wax.  Do not have throw rugs and other things on the floor that can make you trip. What can I do with my stairs?  Do not leave any items on the stairs.  Make sure that there are handrails on both sides of the stairs and use them. Fix handrails that are broken or loose. Make sure that handrails are as long as the stairways.  Check any carpeting to make sure that it is firmly attached to the stairs. Fix any carpet that is loose or worn.  Avoid having throw rugs at the top or bottom of the stairs. If you do have throw rugs, attach them to the floor with carpet tape.  Make  sure that you have a light switch at the top of the stairs and the bottom of the stairs. If you do not have them, ask someone to add them for you. What else can I do to help prevent falls?  Wear shoes that:  Do not have high heels.  Have rubber bottoms.  Are comfortable and fit you well.  Are closed at the toe. Do not wear sandals.  If you use a stepladder:  Make sure that it is fully opened. Do not climb a closed stepladder.  Make sure that both sides of the stepladder are locked into place.  Ask someone to hold it for you, if possible.  Clearly mark and make sure that you can see:  Any grab bars or handrails.  First and last steps.  Where the edge of each step is.  Use tools that help you move around (mobility aids) if they are needed. These include:  Canes.  Walkers.  Scooters.  Crutches.  Turn on the lights when you go into  a dark area. Replace any light bulbs as soon as they burn out.  Set up your furniture so you have a clear path. Avoid moving your furniture around.  If any of your floors are uneven, fix them.  If there are any pets around you, be aware of where they are.  Review your medicines with your doctor. Some medicines can make you feel dizzy. This can increase your chance of falling. Ask your doctor what other things that you can do to help prevent falls. This information is not intended to replace advice given to you by your health care provider. Make sure you discuss any questions you have with your health care provider. Document Released: 06/02/2009 Document Revised: 01/12/2016 Document Reviewed: 09/10/2014 Elsevier Interactive Patient Education  2017 [employer].

## 2016-12-28 DIAGNOSIS — Z9181 History of falling: Secondary | ICD-10-CM | POA: Insufficient documentation

## 2016-12-28 NOTE — Assessment & Plan Note (Signed)
Home safety reviewed with caregiver, and continued use of assistive device and supervision at all times

## 2016-12-28 NOTE — Assessment & Plan Note (Signed)
After obtaining informed consent, the vaccine is  administered by LPN.  

## 2016-12-28 NOTE — Assessment & Plan Note (Signed)
Chronic and worsening as manifest by reduced  Oral intake Continue aricept as  before

## 2016-12-28 NOTE — Progress Notes (Signed)
   Katrina Berry     MRN: [id number]      DOB: 1917/09/30   HPI [name] is here for follow up and re-evaluation of chronic medical conditions, medication management and review of any available recent lab and radiology data.   The caregiver  denies any adverse reactions to current medications since the last visit.  There are no new concerns.  Caregiver concerned about poor oral intake and possible weight loss  ROS Denies recent fever or chills. Denies sinus pressure, nasal congestion, ear pain or sore throat. Denies chest congestion, productive cough or wheezing. Denies chest pains, palpitations and leg swelling Denies abdominal pain, nausea, vomiting,diarrhea or constipation.   Denies dysuria, frequency, hesitancy or incontinence. Denies headaches, seizures, numbness, or tingling. Denies depression, anxiety or insomnia. Denies skin break down or rash.   PE  BP 130/80   Pulse 76   Resp 16   Ht 5' (1.524 m)   Wt 149 lb (67.6 kg)   SpO2 96%   BMI 29.10 kg/m   Patient alert and oriented and in no cardiopulmonary distress.  HEENT: No facial asymmetry, EOMI,   oropharynx pink and moist.  Neck decreased ROM no JVD, no mass.  Chest: Clear to auscultation bilaterally.  CVS: S1, S2 no murmurs, no S3.Regular rate.  ABD: Soft non tender.   Ext: No edema  MS: decreased  ROM spine, shoulders, hips and knees.  Skin: Intact, no ulcerations or rash noted.  Psych: Good eye contact,. Memory loss not anxious or depressed appearing.  CNS: CN 2-12 intact, power,  normal throughout.no focal deficits noted.   Assessment & Plan  Essential hypertension Controlled, no change in medication DASH diet and commitment to daily physical activity for a minimum of 30 minutes discussed and encouraged, as a part of hypertension management. The importance of attaining a healthy weight is also discussed.  BP/Weight 12/25/2016 11/21/2016 11/16/2016 05/10/2016 02/07/2016 11/02/2015 6/54/6503    Systolic BP 546 568 127 517 001 749 449  Diastolic BP 80 62 59 78 78 70 70  Wt. (Lbs) 149 - 157 157.08 157 157 156  BMI 29.1 - 30.66 30.68 28.71 30.66 30.47       Dementia Chronic and worsening as manifest by reduced  Oral intake Continue aricept as  before  Hyperlipidemia LDL goal <130 Hyperlipidemia:Low fat diet discussed and encouraged.   Lipid Panel  Lab Results  Component Value Date   CHOL 139 12/07/2016   HDL 52 12/07/2016   LDLCALC 66 12/07/2016   TRIG 105 12/07/2016   CHOLHDL 2.7 12/07/2016   Controlled, no change in medication     Shoulder pain, right Limitation in mobility, use of tylenol as needed  Need for vaccination with 13-polyvalent pneumococcal conjugate vaccine After obtaining informed consent, the vaccine is  administered by LPN.   At high risk for falls Home safety reviewed with caregiver, and continued use of assistive device and supervision at all times

## 2016-12-28 NOTE — Assessment & Plan Note (Signed)
Controlled, no change in medication DASH diet and commitment to daily physical activity for a minimum of 30 minutes discussed and encouraged, as a part of hypertension management. The importance of attaining a healthy weight is also discussed.  BP/Weight 12/25/2016 11/21/2016 11/16/2016 05/10/2016 02/07/2016 11/02/2015 1/54/0086  Systolic BP 761 950 932 671 245 809 983  Diastolic BP 80 62 59 78 78 70 70  Wt. (Lbs) 149 - 157 157.08 157 157 156  BMI 29.1 - 30.66 30.68 28.71 30.66 30.47

## 2016-12-28 NOTE — Assessment & Plan Note (Signed)
Hyperlipidemia:Low fat diet discussed and encouraged.   Lipid Panel  Lab Results  Component Value Date   CHOL 139 12/07/2016   HDL 52 12/07/2016   LDLCALC 66 12/07/2016   TRIG 105 12/07/2016   CHOLHDL 2.7 12/07/2016   Controlled, no change in medication

## 2016-12-28 NOTE — Assessment & Plan Note (Signed)
Limitation in mobility, use of tylenol as needed

## 2017-01-04 ENCOUNTER — Other Ambulatory Visit: Payer: Self-pay | Admitting: Family Medicine

## 2017-01-05 ENCOUNTER — Other Ambulatory Visit: Payer: Self-pay | Admitting: Family Medicine

## 2017-01-07 ENCOUNTER — Other Ambulatory Visit: Payer: Self-pay | Admitting: Family Medicine

## 2017-01-07 NOTE — Telephone Encounter (Signed)
Vit d level was 68 on 4 20 18   Do you want to renew this?

## 2017-01-09 ENCOUNTER — Other Ambulatory Visit: Payer: Self-pay | Admitting: Family Medicine

## 2017-01-15 ENCOUNTER — Telehealth: Payer: Self-pay | Admitting: Family Medicine

## 2017-01-15 NOTE — Telephone Encounter (Signed)
Per voicemail:   Patients family member called and is requesting to speak to someone because her request for vitamin D refill was denied. Uses [facility]

## 2017-01-15 NOTE — Telephone Encounter (Signed)
Left message that her vit d level was now normal

## 2017-01-19 ENCOUNTER — Other Ambulatory Visit: Payer: Self-pay | Admitting: Family Medicine

## 2017-02-05 DIAGNOSIS — L851 Acquired keratosis [keratoderma] palmaris et plantaris: Secondary | ICD-10-CM | POA: Diagnosis not present

## 2017-02-05 DIAGNOSIS — I739 Peripheral vascular disease, unspecified: Secondary | ICD-10-CM | POA: Diagnosis not present

## 2017-02-05 DIAGNOSIS — B351 Tinea unguium: Secondary | ICD-10-CM | POA: Diagnosis not present

## 2017-02-23 IMAGING — DX DG SHOULDER 2+V*R*
3 series · 3 of 3 positions shown · non-contrast
Comparison: None.

CLINICAL DATA: Pain and limitation of motion

EXAM:
RIGHT SHOULDER - 2+ VIEW

[shoulder ap (1 of 2)]
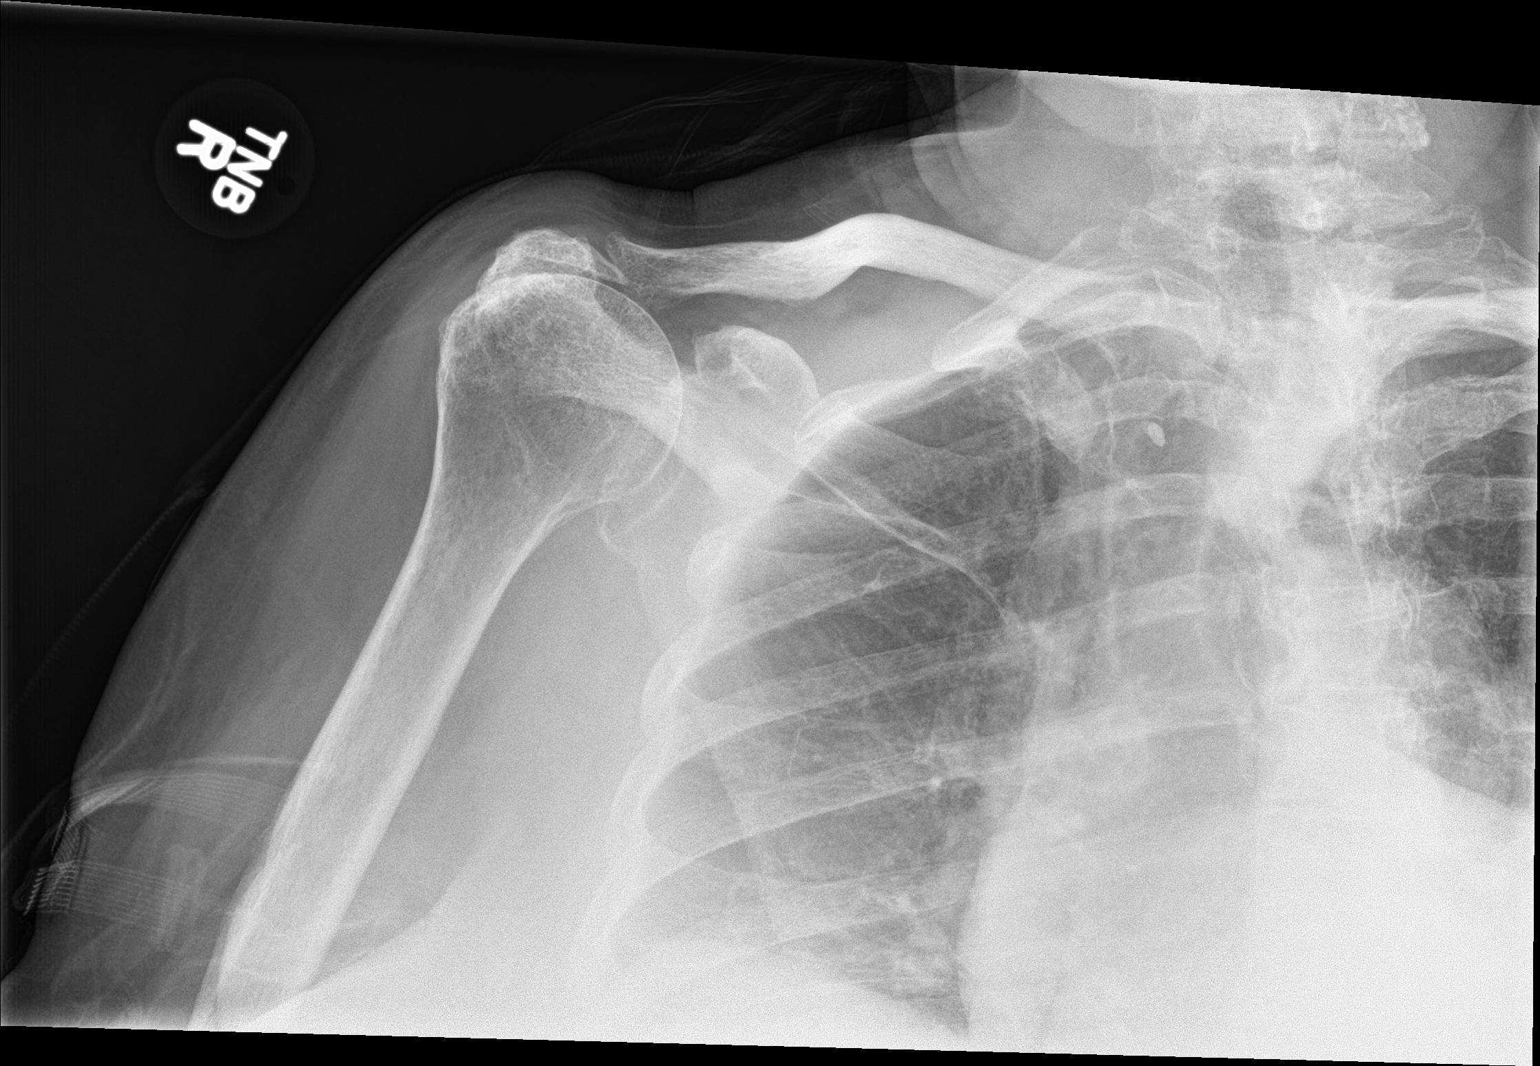

[shoulder ap (2 of 2)]
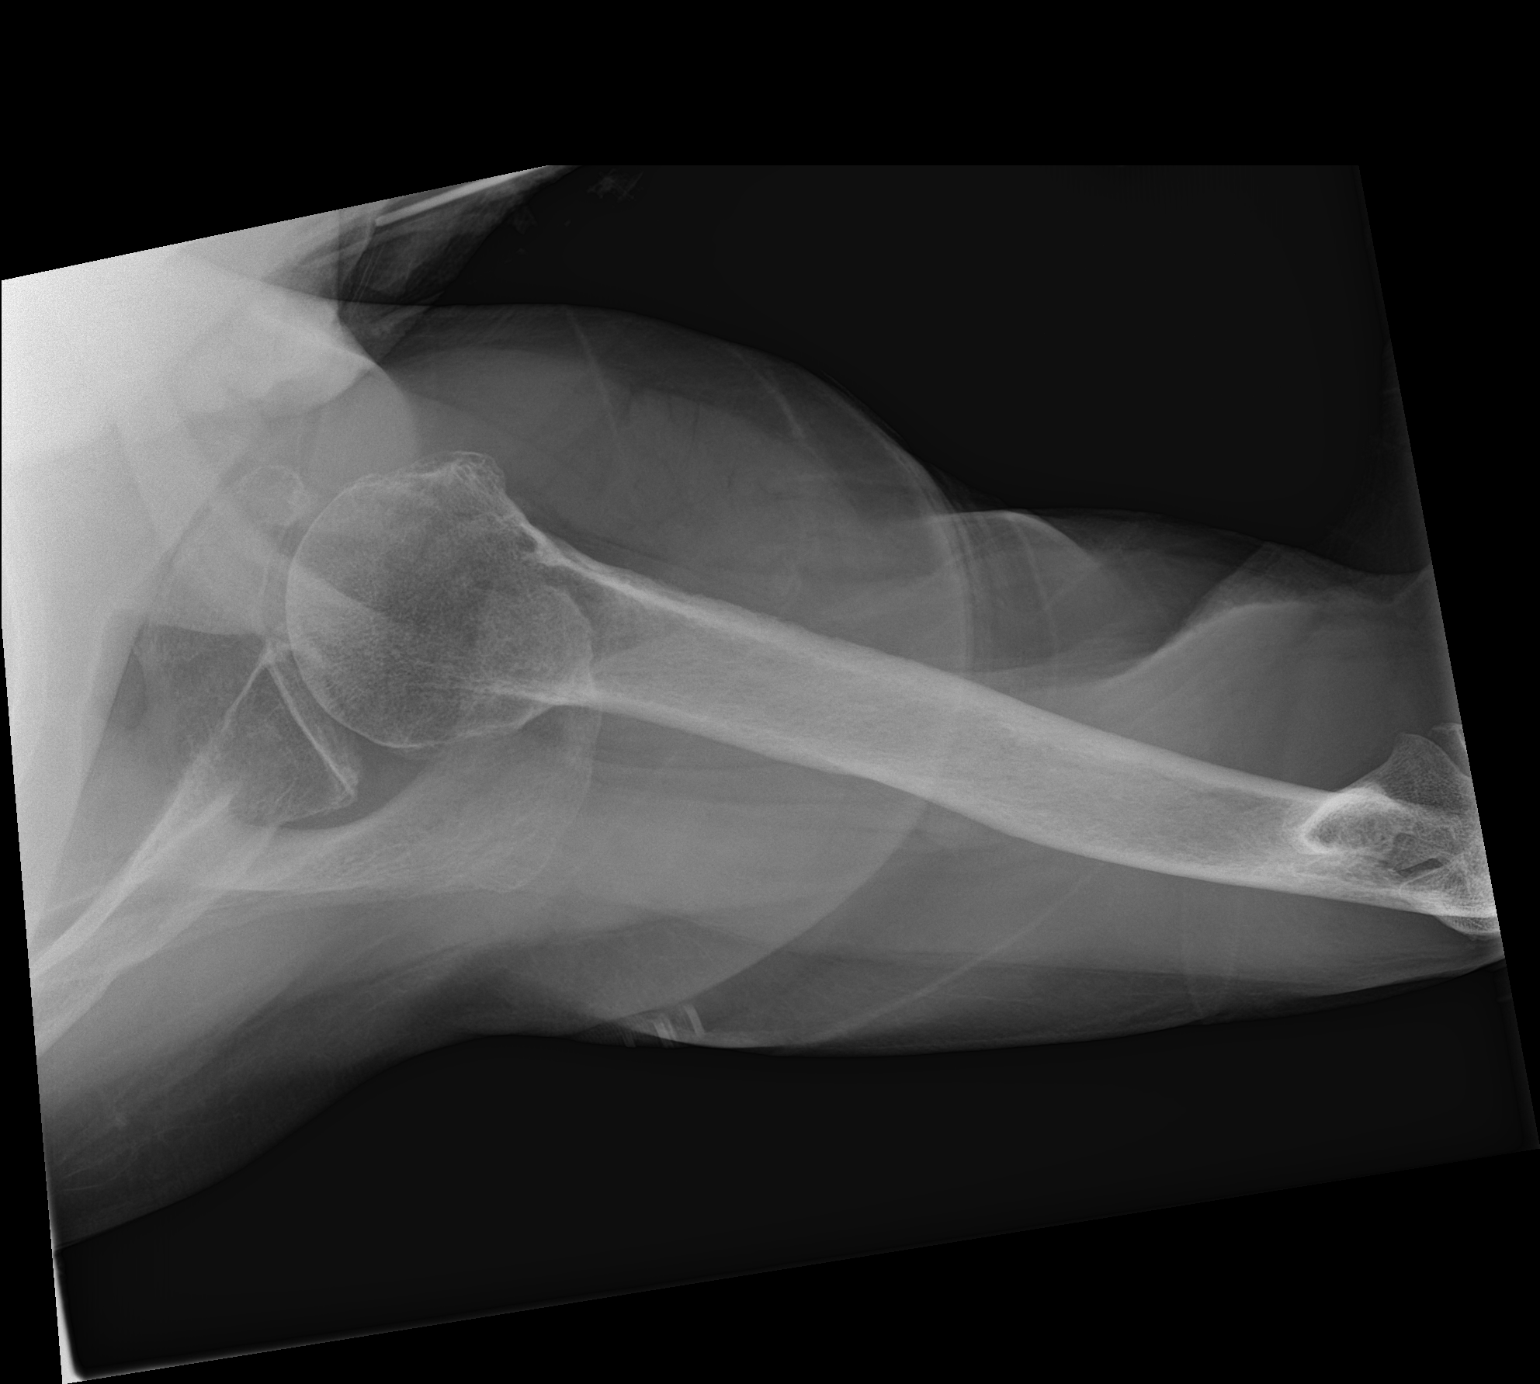

[shoulder axillary]
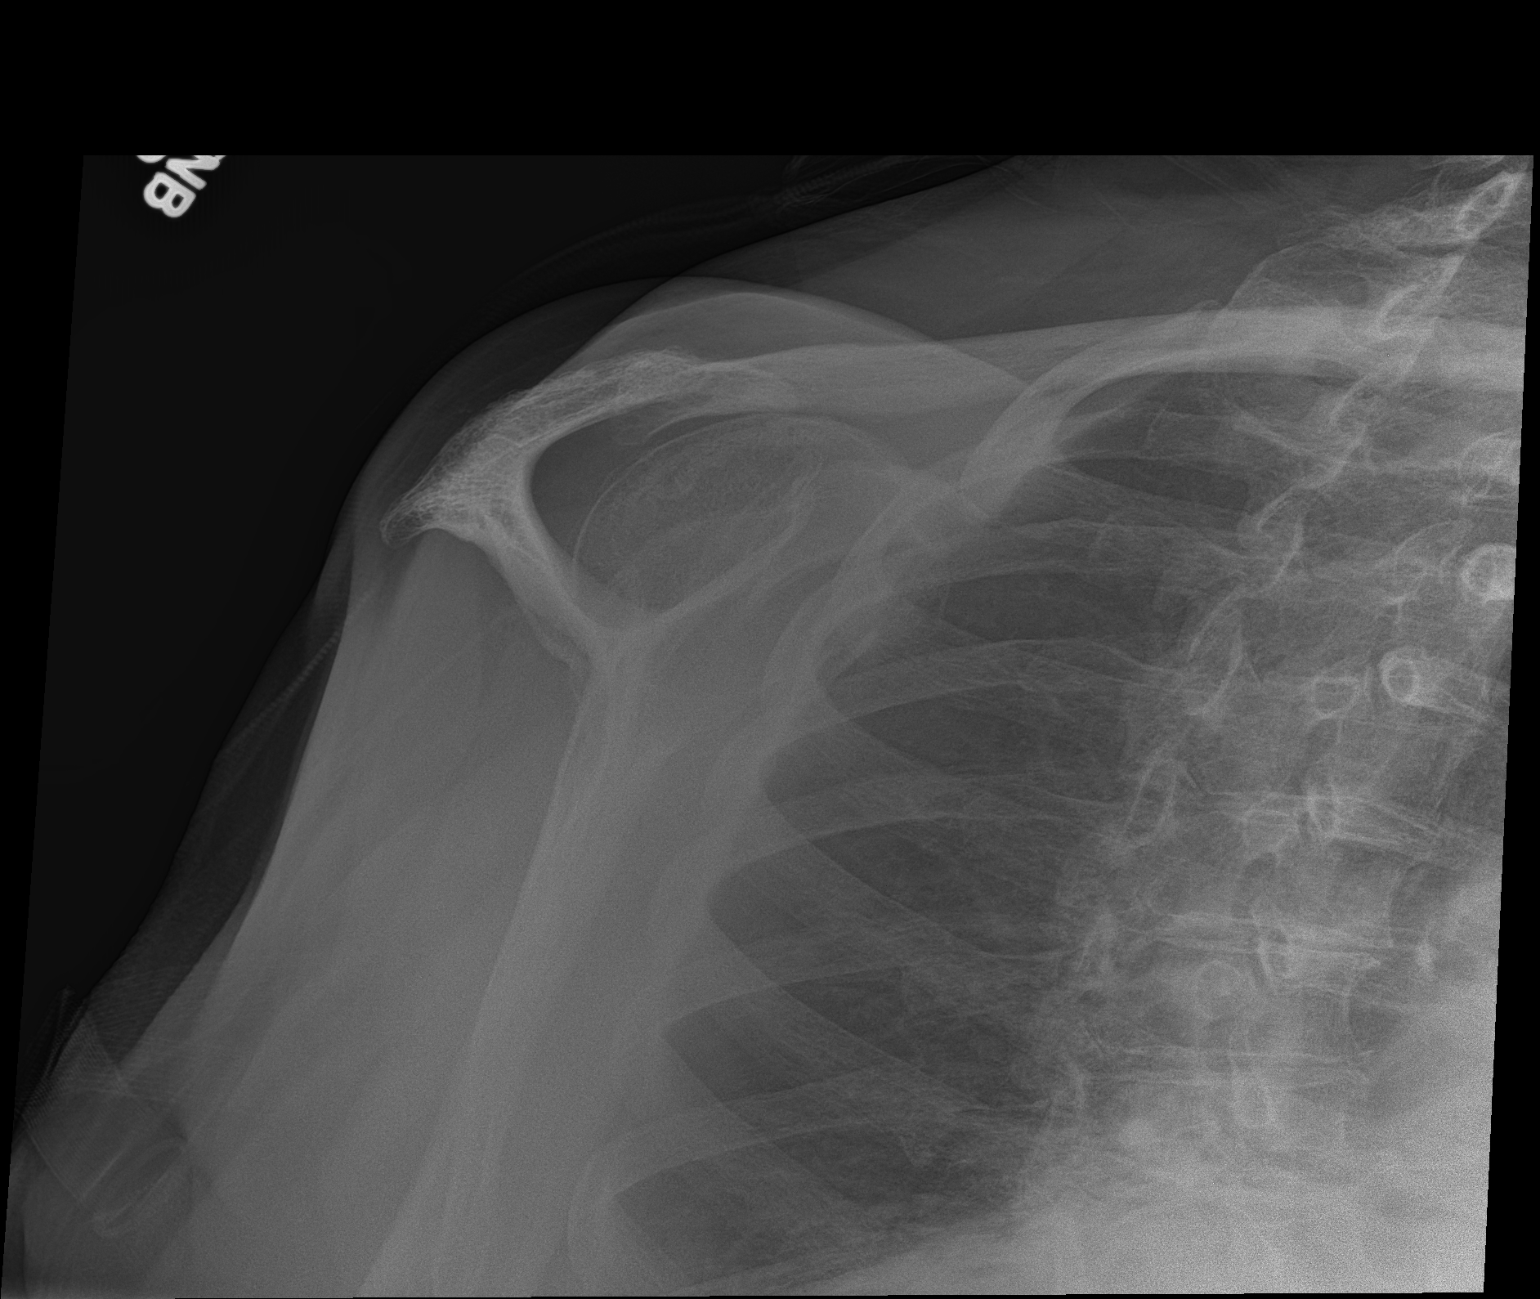

[3 of 3 positions shown; findings below may reference images not displayed]

FINDINGS: Frontal, Y scapular, and axillary images were obtained. No acute
fracture or dislocation. Generalized osteoarthritic changes noted.
There is mild superior migration of the humeral head. No erosive
change.
IMPRESSION: Generalized osteoarthritic change. Superior migration of the humeral
head is indicative of chronic rotator cuff tear. No acute fracture
or dislocation.

## 2017-03-28 ENCOUNTER — Telehealth: Payer: Self-pay

## 2017-03-28 NOTE — Telephone Encounter (Signed)
Called pt to schedule Medicare Annual Wellness Visit. -nr  

## 2017-04-15 ENCOUNTER — Other Ambulatory Visit: Payer: Self-pay | Admitting: Family Medicine

## 2017-04-16 DIAGNOSIS — L851 Acquired keratosis [keratoderma] palmaris et plantaris: Secondary | ICD-10-CM | POA: Diagnosis not present

## 2017-04-16 DIAGNOSIS — B351 Tinea unguium: Secondary | ICD-10-CM | POA: Diagnosis not present

## 2017-04-16 DIAGNOSIS — I739 Peripheral vascular disease, unspecified: Secondary | ICD-10-CM | POA: Diagnosis not present

## 2017-05-15 ENCOUNTER — Ambulatory Visit: Payer: Medicare Other

## 2017-05-28 ENCOUNTER — Ambulatory Visit: Payer: Medicare Other | Admitting: Family Medicine

## 2017-05-29 ENCOUNTER — Encounter: Payer: Self-pay | Admitting: Family Medicine

## 2017-05-29 ENCOUNTER — Ambulatory Visit (INDEPENDENT_AMBULATORY_CARE_PROVIDER_SITE_OTHER): Payer: Medicare Other | Admitting: Family Medicine

## 2017-05-29 VITALS — BP 122/74 | HR 65 | Temp 98.1°F | Resp 16 | Wt 149.2 lb

## 2017-05-29 DIAGNOSIS — M25511 Pain in right shoulder: Secondary | ICD-10-CM | POA: Diagnosis not present

## 2017-05-29 DIAGNOSIS — E785 Hyperlipidemia, unspecified: Secondary | ICD-10-CM

## 2017-05-29 DIAGNOSIS — Z23 Encounter for immunization: Secondary | ICD-10-CM | POA: Diagnosis not present

## 2017-05-29 DIAGNOSIS — Z9181 History of falling: Secondary | ICD-10-CM

## 2017-05-29 DIAGNOSIS — F0150 Vascular dementia without behavioral disturbance: Secondary | ICD-10-CM

## 2017-05-29 DIAGNOSIS — L988 Other specified disorders of the skin and subcutaneous tissue: Secondary | ICD-10-CM

## 2017-05-29 DIAGNOSIS — N649 Disorder of breast, unspecified: Secondary | ICD-10-CM

## 2017-05-29 DIAGNOSIS — G8929 Other chronic pain: Secondary | ICD-10-CM | POA: Diagnosis not present

## 2017-05-29 DIAGNOSIS — D3610 Benign neoplasm of peripheral nerves and autonomic nervous system, unspecified: Secondary | ICD-10-CM | POA: Insufficient documentation

## 2017-05-29 DIAGNOSIS — I10 Essential (primary) hypertension: Secondary | ICD-10-CM | POA: Diagnosis not present

## 2017-05-29 HISTORY — DX: Benign neoplasm of peripheral nerves and autonomic nervous system, unspecified: D36.10

## 2017-05-29 NOTE — Assessment & Plan Note (Signed)
Hyperlipidemia:Low fat diet discussed and encouraged.   Lipid Panel  Lab Results  Component Value Date   CHOL 139 12/07/2016   HDL 52 12/07/2016   LDLCALC 66 12/07/2016   TRIG 105 12/07/2016   CHOLHDL 2.7 12/07/2016     Updated lab needed at/ before next visit.

## 2017-05-29 NOTE — Assessment & Plan Note (Signed)
Unchanged, gentle regular ROM encouraged and tylenol for pain

## 2017-05-29 NOTE — Progress Notes (Signed)
   Katrina Berry     MRN: [id number]      DOB: 1918/04/20   HPI Katrina Berry is here for follow up and re-evaluation of chronic medical conditions, medication management and review of any available recent lab and radiology data.  Preventive health is updated, specifically  Immunization.  The PT denies any adverse reactions to current medications since the last visit.  Caregiver reports that she just became aware on  The day of the visit , that the pt has a growth on the right breast that she keeps picking at and she wants/ needs thisremoved Denies falls, and reports good appetite and regular bowel movements  ROS History from caregiver Denies recent fever or chills. Denies sinus pressure, nasal congestion, ear pain or sore throat. Denies chest congestion, productive cough or wheezing. Denies chest pains, palpitations and leg swelling Denies abdominal pain, nausea, vomiting,diarrhea or constipation.   Denies dysuria, frequency, hesitancy or incontinence. C/o chronic  joint pain, swelling and limitation in mobility. Denies headaches, seizures, numbness, or tingling. Denies depression, anxiety or insomnia. PE  BP 122/74 (BP Location: Left Arm, Patient Position: Sitting, Cuff Size: Normal)   Pulse 65   Temp 98.1 F (36.7 C)   Resp 16   Wt 149 lb 4 oz (67.7 kg)   SpO2 97%   BMI 29.15 kg/m   Patient alert , disoriented,and in no cardiopulmonary distress.  HEENT: No facial asymmetry, EOMI,   oropharynx pink and moist.  Neck supple no JVD, no mass.  Chest: Clear to auscultation bilaterally. Breast: right breast has vascular , pedunculated lesion, some of which looks mildly purulent / necrotic, approx 2 cm in length at about th 2 o clock position CVS: S1, S2 no murmurs, no S3.Regular rate.  ABD: Soft non tender.   Ext: No edema  MS: Adequate though reduced  ROM spine, , hips and knees. Markedly reduced ROM shoulders  Skin: Intact, no ulcerations or rash noted.  Psych: Good  eye contact, normal affect. Memory loss marked not anxious or depressed appearing.  CNS: CN 2-12 intact, power,  normal throughout.no focal deficits noted.   Assessment & Plan  Skin lesion of breast protuberant right breast lesion at 2 o clock,  , protuberant, length approximately 2.5 cm one area seems slightly necrotic, needs to be removed asap, refer to surgery, pt will repeatedly traumatize area, high risk of bleeding  Essential hypertension Controlled, no change in medication   Dementia Stable on current medication, no chnage  Shoulder pain, right Unchanged, gentle regular ROM encouraged and tylenol for pain  Hyperlipidemia LDL goal <130 Hyperlipidemia:Low fat diet discussed and encouraged.   Lipid Panel  Lab Results  Component Value Date   CHOL 139 12/07/2016   HDL 52 12/07/2016   LDLCALC 66 12/07/2016   TRIG 105 12/07/2016   CHOLHDL 2.7 12/07/2016     Updated lab needed at/ before next visit.   At high risk for falls Home safety and fall precautions reviewed briefly with caregiver

## 2017-05-29 NOTE — Assessment & Plan Note (Signed)
Stable on current medication, no chnage

## 2017-05-29 NOTE — Assessment & Plan Note (Signed)
Home safety and fall precautions reviewed briefly with caregiver

## 2017-05-29 NOTE — Assessment & Plan Note (Addendum)
protuberant right breast lesion at 2 o clock,  , protuberant, length approximately 2.5 cm one area seems slightly necrotic, needs to be removed asap, refer to surgery, pt will repeatedly traumatize area, high risk of bleeding

## 2017-05-29 NOTE — Assessment & Plan Note (Signed)
Controlled, no change in medication  

## 2017-05-29 NOTE — Patient Instructions (Signed)
F/u in 5 month, call if you need me sooner  Flu vaccine today  You are referred to surgery as soon as possible opt have rigth breast lesion removed  Do NOT pick at o it!it will bleed and has high risk of becoming infected.   Fasting lipid, cmp in next 1 to 2 weeks  Be careful not to fall  All the best for the rest of the year and New year!

## 2017-05-30 ENCOUNTER — Ambulatory Visit (INDEPENDENT_AMBULATORY_CARE_PROVIDER_SITE_OTHER): Payer: Medicare Other | Admitting: General Surgery

## 2017-05-30 ENCOUNTER — Encounter: Payer: Self-pay | Admitting: General Surgery

## 2017-05-30 VITALS — BP 167/64 | HR 51 | Temp 97.3°F | Resp 18 | Ht 63.0 in | Wt 148.0 lb

## 2017-05-30 DIAGNOSIS — L988 Other specified disorders of the skin and subcutaneous tissue: Secondary | ICD-10-CM

## 2017-05-30 DIAGNOSIS — N649 Disorder of breast, unspecified: Secondary | ICD-10-CM | POA: Diagnosis not present

## 2017-05-31 ENCOUNTER — Encounter: Payer: Self-pay | Admitting: General Surgery

## 2017-05-31 NOTE — H&P (Signed)
[facility] History and Physical  Reason for Referral: Right breast skin lesion / mass  Referring Physician: Dr. Moshe Cipro   Chief Complaint    Breast Mass      Katrina Berry is a 83 y.o. female.   HPI: Katrina Berry is a 83 yo with dementia who is cared for by her niece.  Her niece noticed an area on her right breast that she thought was a mole/ skin tag, and has acutely become inflamed and large.  The patient is unable to answer any questions due to her dementia, and the niece is unsure of prior mammograms that the patient has had. She reports that this lesion showed up in the last week or so, and that the patient has been picking at it and playing with it.  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  The lesion does have a foul odor.   Past Medical History:  Diagnosis Date  . Arthritis    mild, no known falls uses cane  . Dementia   . Hypertension     Past Surgical History:  Procedure Laterality Date  . NO PAST SURGERIES      Family History  Problem Relation Age of Onset  . Diabetes Sister   . Hypertension Sister     Social History  Substance Use Topics  . Smoking status: Never Smoker  . Smokeless tobacco: Never Used  . Alcohol use No    Medications: I have reviewed the patient's current medications. Allergies as of 05/30/2017   Not on File     Medication List       Accurate as of 05/30/17 11:59 PM. Always use your most recent med list.          acetaminophen 500 MG tablet Commonly known as:  TYLENOL Take 500 mg by mouth every 6 (six) hours as needed for mild pain or headache.   aspirin EC 81 MG tablet Take 1 tablet (81 mg total) by mouth daily.   atorvastatin 10 MG tablet Commonly known as:  LIPITOR TAKE 1 TABLET (10 MG TOTAL) BY MOUTH DAILY.   CALCIUM 600+D3 600-800 MG-UNIT Tabs Generic drug:  Calcium Carb-Cholecalciferol Take 1 tablet by mouth daily.    cetirizine 10 MG tablet Commonly known as:  ZYRTEC Take 10 mg by mouth daily as needed for allergies.   donepezil 10 MG tablet Commonly known as:  ARICEPT TAKE 1 TABLET (10 MG TOTAL) BY MOUTH AT BEDTIME.   losartan 100 MG tablet Commonly known as:  COZAAR TAKE 1 TABLET (100 MG TOTAL) BY MOUTH DAILY.   montelukast 10 MG tablet Commonly known as:  SINGULAIR Take 1 tablet (10 mg total) by mouth at bedtime.   multivitamin with minerals Tabs tablet Take 1 tablet by mouth daily.   polyethylene glycol packet Commonly known as:  MIRALAX / GLYCOLAX Take 17 g by mouth daily as needed for mild constipation.        ROS:  Review of systems not obtained due to patient factors. dementia   Blood pressure (!) 167/64, pulse (!) 51, temperature (!) 97.3 F (36.3 C), resp. rate 18, height 5\' 3"  (1.6 m), weight 148 lb (67.1 kg). Physical Exam  Constitutional: She is well-developed, well-nourished, and in no distress.  HENT:  Head: Normocephalic.  Cardiovascular: Normal rate and regular rhythm.   Pulmonary/Chest: Effort normal and breath sounds normal.  Right medial breast with 1.5cm pink peduculated  mass with narrow base, no erythema surrounding the area, some yellow foul smelling drainage, right breast unremarkable for other masses or nipple discharge, right axilla without masses, left breast without masses or discharge, left axilla without masses   Abdominal: Soft. She exhibits no distension. There is no tenderness.  Musculoskeletal: She exhibits no edema.  Neurological: She is alert.  Confused, not oriented   Skin: Skin is warm and dry.  Psychiatric: She exhibits abnormal recent memory and abnormal remote memory.    Results: None  Assessment & Plan:  Katrina Berry is a 83 y.o. female with a newly identified right breast skin lesion that appears to be localized to the skin without underlying masses or other masses in the breast.  Without a history of mammograms or knowledge of  mammograms it is difficult to give a real prediction for what this could be.  After a discussion with the niece, she would like to have the lesion removed to prevent the patient from picking at the lesion and to establish a diagnosis.  She does not want to pursue any aggressive treatments or larger surgeries such as a lumpectomy or mastectomy if this were cancer.   -OR for excision of right breast skin lesion and primary closure  -Preop Orders done   All questions were answered to the satisfaction of the niece.  The risk and benefits of excision of the right breast skin lesion were discussed including but not limited to infection, bleeding, misdiagnosis or missed lesions elsewhere in the breast.  We discussed that if no further surgeries are desired, then there is no utility in a mammogram or further imaging.  We also discussed that the patient would have the breast cancer and could potentially die from it, but given her age, she would like live with it until her passing.  After careful consideration, Katrina Berry has decided to proceed with simple excision of this lesion.    Virl Cagey 05/31/2017, 5:02 PM

## 2017-05-31 NOTE — Progress Notes (Signed)
[facility] History and Physical  Reason for Referral: Right breast skin lesion / mass  Referring Physician: Dr. Moshe Cipro   Chief Complaint    Breast Mass      Katrina Berry is a 83 y.o. female.   HPI: Katrina Berry is a 83 yo with dementia who is cared for by her niece.  Her niece noticed an area on her right breast that she thought was a mole/ skin tag, and has acutely become inflamed and large.  The patient is unable to answer any questions due to her dementia, and the niece is unsure of prior mammograms that the patient has had. She reports that this lesion showed up in the last week or so, and that the patient has been picking at it and playing with it.  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  The lesion does have a foul odor.   Past Medical History:  Diagnosis Date  . Arthritis    mild, no known falls uses cane  . Dementia   . Hypertension     Past Surgical History:  Procedure Laterality Date  . NO PAST SURGERIES      Family History  Problem Relation Age of Onset  . Diabetes Sister   . Hypertension Sister     Social History  Substance Use Topics  . Smoking status: Never Smoker  . Smokeless tobacco: Never Used  . Alcohol use No    Medications: I have reviewed the patient's current medications. Allergies as of 05/30/2017   Not on File     Medication List       Accurate as of 05/30/17 11:59 PM. Always use your most recent med list.          acetaminophen 500 MG tablet Commonly known as:  TYLENOL Take 500 mg by mouth every 6 (six) hours as needed for mild pain or headache.   aspirin EC 81 MG tablet Take 1 tablet (81 mg total) by mouth daily.   atorvastatin 10 MG tablet Commonly known as:  LIPITOR TAKE 1 TABLET (10 MG TOTAL) BY MOUTH DAILY.   CALCIUM 600+D3 600-800 MG-UNIT Tabs Generic drug:  Calcium Carb-Cholecalciferol Take 1 tablet by mouth daily.    cetirizine 10 MG tablet Commonly known as:  ZYRTEC Take 10 mg by mouth daily as needed for allergies.   donepezil 10 MG tablet Commonly known as:  ARICEPT TAKE 1 TABLET (10 MG TOTAL) BY MOUTH AT BEDTIME.   losartan 100 MG tablet Commonly known as:  COZAAR TAKE 1 TABLET (100 MG TOTAL) BY MOUTH DAILY.   montelukast 10 MG tablet Commonly known as:  SINGULAIR Take 1 tablet (10 mg total) by mouth at bedtime.   multivitamin with minerals Tabs tablet Take 1 tablet by mouth daily.   polyethylene glycol packet Commonly known as:  MIRALAX / GLYCOLAX Take 17 g by mouth daily as needed for mild constipation.        ROS:  Review of systems not obtained due to patient factors. dementia   Blood pressure (!) 167/64, pulse (!) 51, temperature (!) 97.3 F (36.3 C), resp. rate 18, height 5\' 3"  (1.6 m), weight 148 lb (67.1 kg). Physical Exam  Constitutional: She is well-developed, well-nourished, and in no distress.  HENT:  Head: Normocephalic.  Cardiovascular: Normal rate and regular rhythm.   Pulmonary/Chest: Effort normal and breath sounds normal.  Right medial breast with 1.5cm pink peduculated  mass with narrow base, no erythema surrounding the area, some yellow foul smelling drainage, right breast unremarkable for other masses or nipple discharge, right axilla without masses, left breast without masses or discharge, left axilla without masses   Abdominal: Soft. She exhibits no distension. There is no tenderness.  Musculoskeletal: She exhibits no edema.  Neurological: She is alert.  Confused, not oriented   Skin: Skin is warm and dry.  Psychiatric: She exhibits abnormal recent memory and abnormal remote memory.    Results: None  Assessment & Plan:  Katrina Berry is a 83 y.o. female with a newly identified right breast skin lesion that appears to be localized to the skin without underlying masses or other masses in the breast.  Without a history of mammograms or knowledge of  mammograms it is difficult to give a real prediction for what this could be.  After a discussion with the niece, she would like to have the lesion removed to prevent the patient from picking at the lesion and to establish a diagnosis.  She does not want to pursue any aggressive treatments or larger surgeries such as a lumpectomy or mastectomy if this were cancer.   -OR for excision of right breast skin lesion and primary closure  -Preop Orders done   All questions were answered to the satisfaction of the niece.  The risk and benefits of excision of the right breast skin lesion were discussed including but not limited to infection, bleeding, misdiagnosis or missed lesions elsewhere in the breast.  We discussed that if no further surgeries are desired, then there is no utility in a mammogram or further imaging.  We also discussed that the patient would have the breast cancer and could potentially die from it, but given her age, she would like live with it until her passing.  After careful consideration, Katrina Berry has decided to proceed with simple excision of this lesion.    Katrina Berry 05/31/2017, 5:02 PM

## 2017-06-04 NOTE — Patient Instructions (Signed)
Katrina Berry  06/04/2017     @PREFPERIOPPHARMACY @   Your procedure is scheduled on  06/07/2017 .  Report to [facility] at  1000  A.M.  Call this number if you have problems the morning of surgery:  [phone]   Remember:  Do not eat food or drink liquids after midnight.  Take these medicines the morning of surgery with A SIP OF WATER  Zyrtec, aricept, losartan.   Do not wear jewelry, make-up or nail polish.  Do not wear lotions, powders, or perfumes, or deoderant.  Do not shave 48 hours prior to surgery.  Men may shave face and neck.  Do not bring valuables to the hospital.  [facility] is not responsible for any belongings or valuables.  Contacts, dentures or bridgework may not be worn into surgery.  Leave your suitcase in the car.  After surgery it may be brought to your room.  For patients admitted to the hospital, discharge time will be determined by your treatment team.  Patients discharged the day of surgery will not be allowed to drive home.   Name and phone number of your driver:   family Special instructions: None  Please read over the following fact sheets that you were given. Anesthesia Post-op Instructions and Care and Recovery After Surgery      Excision of Skin Lesions Excision of a skin lesion refers to the removal of a section of skin by making small cuts (incisions) in the skin. This procedure may be done to remove a cancerous (malignant) or noncancerous (benign) growth on the skin. It is typically done to treat or prevent cancer or infection. It may also be done to improve cosmetic appearance. The procedure may be done to remove:  Cancerous growths, such as basal cell carcinoma, squamous cell carcinoma, or melanoma.  Noncancerous growths, such as a cyst or lipoma.  Growths, such as moles or skin tags, which may be removed for cosmetic reasons.  Various excision or surgical techniques may be used depending on your condition,  the location of the lesion, and your overall health. Tell a health care provider about:  Any allergies you have.  All medicines you are taking, including vitamins, herbs, eye drops, creams, and over-the-counter medicines.  Any problems you or family members have had with anesthetic medicines.  Any blood disorders you have.  Any surgeries you have had.  Any medical conditions you have.  Whether you are pregnant or may be pregnant. What are the risks? Generally, this is a safe procedure. However, problems may occur, including:  Bleeding.  Infection.  Scarring.  Recurrence of the cyst, lipoma, or cancer.  Changes in skin sensation or appearance, such as discoloration or swelling.  Reaction to the anesthetics.  Allergic reaction to surgical materials or ointments.  Damage to nerves, blood vessels, muscles, or other structures.  Continued pain.  What happens before the procedure?  Ask your health care provider about: ? Changing or stopping your regular medicines. This is especially important if you are taking diabetes medicines or blood thinners. ? Taking medicines such as aspirin and ibuprofen. These medicines can thin your blood. Do not take these medicines before your procedure if your health care provider instructs you not to.  You may be asked to take certain medicines.  You may be asked to stop smoking.  You may have an exam or testing.  Plan to have someone take you home after the procedure.  Plan to have someone help you with activities during recovery. What happens during the procedure?  To reduce your risk of infection: ? Your health care team will wash or sanitize their hands. ? Your skin will be washed with soap.  You will be given a medicine to numb the area (local anesthetic).  One of the following excision techniques will be performed.  At the end of any of these procedures, antibiotic ointment will be applied as needed. Each of the following  techniques may vary among health care providers and hospitals. Complete Surgical Excision The area of skin that needs to be removed will be marked with a pen. Using a small scalpel or scissors, the surgeon will gently cut around and under the lesion until it is completely removed. The lesion will be placed in a fluid and sent to the lab for examination. If necessary, bleeding will be controlled with a device that delivers heat (electrocautery). The edges of the wound may be stitched (sutured) together, and a bandage (dressing) will be applied. This procedure may be performed to treat a cancerous growth or a noncancerous cyst or lesion. Excision of a Cyst The surgeon will make an incision on the cyst. The entire cyst will be removed through the incision. The incision may be closed with sutures. Shave Excision During shave excision, the surgeon will use a small blade or an electrically heated loop instrument to shave off the lesion. This may be done to remove a mole or a skin tag. The wound will usually be left to heal on its own without sutures. Punch Excision During punch excision, the surgeon will use a small tool that is like a cookie cutter or a hole punch to cut a circle shape out of the skin. The outer edges of the skin will be sutured together. This may be done to remove a mole or a scar or to perform a biopsy of the lesion. Mohs Micrographic Surgery During Mohs micrographic surgery, layers of the lesion will be removed with a scalpel or a loop instrument and will be examined right away under a microscope. Layers will be removed until all of the abnormal or cancerous tissue has been removed. This procedure is minimally invasive, and it ensures the best cosmetic outcome. It involves the removal of as little normal tissue as possible. Mohs is usually done to treat skin cancer, such as basal cell carcinoma or squamous cell carcinoma, particularly on the face and ears. Depending on the size of the  surgical wound, it may be sutured closed. What happens after the procedure?  Return to your normal activities as told by your health care provider.  Talk with your health care provider to discuss any test results, treatment options, and if necessary, the need for more tests. This information is not intended to replace advice given to you by your health care provider. Make sure you discuss any questions you have with your health care provider. Document Released: 10/31/2009 Document Revised: 01/12/2016 Document Reviewed: 09/22/2014 Elsevier Interactive Patient Education  2018 [employer]. Excision of Skin Lesions, Care After Refer to this sheet in the next few weeks. These instructions provide you with information about caring for yourself after your procedure. Your health care provider may also give you more specific instructions. Your treatment has been planned according to current medical practices, but problems sometimes occur. Call your health care provider if you have any problems or questions after your procedure. What can I expect after the procedure? After your procedure,  it is common to have pain or discomfort at the excision site. Follow these instructions at home:  Take over-the-counter and prescription medicines only as told by your health care provider.  Follow instructions from your health care provider about: ? How to take care of your excision site. You should keep the site clean, dry, and protected for at least 48 hours. ? When and how you should change your bandage (dressing). ? When you should remove your dressing. ? Removing whatever was used to close your excision site.  Check the excision area every day for signs of infection. Watch for: ? Redness, swelling, or pain. ? Fluid, blood, or pus.  For bleeding, apply gentle but firm pressure to the area using a folded towel for 20 minutes.  Avoid high-impact exercise and activities until the stitches (sutures) are  removed or the area heals.  Follow instructions from your health care provider about how to minimize scarring. Avoid sun exposure until the area has healed. Scarring should lessen over time.  Keep all follow-up visits as told by your health care provider. This is important. Contact a health care provider if:  You have a fever.  You have redness, swelling, or pain at the excision site.  You have fluid, blood, or pus coming from the excision site.  You have ongoing bleeding at the excision site.  You have pain that does not improve in 2-3 days after your procedure.  You notice skin irregularities or changes in sensation. This information is not intended to replace advice given to you by your health care provider. Make sure you discuss any questions you have with your health care provider. Document Released: 12/21/2014 Document Revised: 01/12/2016 Document Reviewed: 09/22/2014 Elsevier Interactive Patient Education  2018 [employer] Anesthesia is a term that refers to techniques, procedures, and medicines that help a person stay safe and comfortable during a medical procedure. Monitored anesthesia care, or sedation, is one type of anesthesia. Your anesthesia specialist may recommend sedation if you will be having a procedure that does not require you to be unconscious, such as:  Cataract surgery.  A dental procedure.  A biopsy.  A colonoscopy.  During the procedure, you may receive a medicine to help you relax (sedative). There are three levels of sedation:  Mild sedation. At this level, you may feel awake and relaxed. You will be able to follow directions.  Moderate sedation. At this level, you will be sleepy. You may not remember the procedure.  Deep sedation. At this level, you will be asleep. You will not remember the procedure.  The more medicine you are given, the deeper your level of sedation will be. Depending on how you respond to the  procedure, the anesthesia specialist may change your level of sedation or the type of anesthesia to fit your needs. An anesthesia specialist will monitor you closely during the procedure. Let your health care provider know about:  Any allergies you have.  All medicines you are taking, including vitamins, herbs, eye drops, creams, and over-the-counter medicines.  Any use of steroids (by mouth or as a cream).  Any problems you or family members have had with sedatives and anesthetic medicines.  Any blood disorders you have.  Any surgeries you have had.  Any medical conditions you have, such as sleep apnea.  Whether you are pregnant or may be pregnant.  Any use of cigarettes, alcohol, or street drugs. What are the risks? Generally, this is a safe procedure. However,  problems may occur, including:  Getting too much medicine (oversedation).  Nausea.  Allergic reaction to medicines.  Trouble breathing. If this happens, a breathing tube may be used to help with breathing. It will be removed when you are awake and breathing on your own.  Heart trouble.  Lung trouble.  Before the procedure Staying hydrated Follow instructions from your health care provider about hydration, which may include:  Up to 2 hours before the procedure - you may continue to drink clear liquids, such as water, clear fruit juice, black coffee, and plain tea.  Eating and drinking restrictions Follow instructions from your health care provider about eating and drinking, which may include:  8 hours before the procedure - stop eating heavy meals or foods such as meat, fried foods, or fatty foods.  6 hours before the procedure - stop eating light meals or foods, such as toast or cereal.  6 hours before the procedure - stop drinking milk or drinks that contain milk.  2 hours before the procedure - stop drinking clear liquids.  Medicines Ask your health care provider about:  Changing or stopping your  regular medicines. This is especially important if you are taking diabetes medicines or blood thinners.  Taking medicines such as aspirin and ibuprofen. These medicines can thin your blood. Do not take these medicines before your procedure if your health care provider instructs you not to.  Tests and exams  You will have a physical exam.  You may have blood tests done to show: ? How well your kidneys and liver are working. ? How well your blood can clot.  General instructions  Plan to have someone take you home from the hospital or clinic.  If you will be going home right after the procedure, plan to have someone with you for 24 hours.  What happens during the procedure?  Your blood pressure, heart rate, breathing, level of pain and overall condition will be monitored.  An IV tube will be inserted into one of your veins.  Your anesthesia specialist will give you medicines as needed to keep you comfortable during the procedure. This may mean changing the level of sedation.  The procedure will be performed. After the procedure  Your blood pressure, heart rate, breathing rate, and blood oxygen level will be monitored until the medicines you were given have worn off.  Do not drive for 24 hours if you received a sedative.  You may: ? Feel sleepy, clumsy, or nauseous. ? Feel forgetful about what happened after the procedure. ? Have a sore throat if you had a breathing tube during the procedure. ? Vomit. This information is not intended to replace advice given to you by your health care provider. Make sure you discuss any questions you have with your health care provider. Document Released: 05/02/2005 Document Revised: 01/13/2016 Document Reviewed: 11/27/2015 Elsevier Interactive Patient Education  2018 [place], Care After These instructions provide you with information about caring for yourself after your procedure. Your health care provider may  also give you more specific instructions. Your treatment has been planned according to current medical practices, but problems sometimes occur. Call your health care provider if you have any problems or questions after your procedure. What can I expect after the procedure? After your procedure, it is common to:  Feel sleepy for several hours.  Feel clumsy and have poor balance for several hours.  Feel forgetful about what happened after the procedure.  Have poor judgment for  several hours.  Feel nauseous or vomit.  Have a sore throat if you had a breathing tube during the procedure.  Follow these instructions at home: For at least 24 hours after the procedure:   Do not: ? Participate in activities in which you could fall or become injured. ? Drive. ? Use heavy machinery. ? Drink alcohol. ? Take sleeping pills or medicines that cause drowsiness. ? Make important decisions or sign legal documents. ? Take care of children on your own.  Rest. Eating and drinking  Follow the diet that is recommended by your health care provider.  If you vomit, drink water, juice, or soup when you can drink without vomiting.  Make sure you have little or no nausea before eating solid foods. General instructions  Have a responsible adult stay with you until you are awake and alert.  Take over-the-counter and prescription medicines only as told by your health care provider.  If you smoke, do not smoke without supervision.  Keep all follow-up visits as told by your health care provider. This is important. Contact a health care provider if:  You keep feeling nauseous or you keep vomiting.  You feel light-headed.  You develop a rash.  You have a fever. Get help right away if:  You have trouble breathing. This information is not intended to replace advice given to you by your health care provider. Make sure you discuss any questions you have with your health care provider. Document  Released: 11/27/2015 Document Revised: 03/28/2016 Document Reviewed: 11/27/2015 Elsevier Interactive Patient Education  [employer].

## 2017-06-05 ENCOUNTER — Encounter (HOSPITAL_COMMUNITY): Payer: Self-pay

## 2017-06-05 ENCOUNTER — Encounter (HOSPITAL_COMMUNITY)
Admission: RE | Admit: 2017-06-05 | Discharge: 2017-06-05 | Disposition: A | Discharge status: Home / Self Care | Payer: Medicare Other | Source: Ambulatory Visit | Attending: General Surgery | Admitting: General Surgery

## 2017-06-05 DIAGNOSIS — D3610 Benign neoplasm of peripheral nerves and autonomic nervous system, unspecified: Secondary | ICD-10-CM | POA: Diagnosis not present

## 2017-06-05 DIAGNOSIS — I10 Essential (primary) hypertension: Secondary | ICD-10-CM | POA: Diagnosis not present

## 2017-06-05 DIAGNOSIS — Z79899 Other long term (current) drug therapy: Secondary | ICD-10-CM | POA: Diagnosis not present

## 2017-06-05 DIAGNOSIS — L989 Disorder of the skin and subcutaneous tissue, unspecified: Secondary | ICD-10-CM | POA: Diagnosis not present

## 2017-06-05 DIAGNOSIS — Z7982 Long term (current) use of aspirin: Secondary | ICD-10-CM | POA: Diagnosis not present

## 2017-06-05 DIAGNOSIS — F0390 Unspecified dementia without behavioral disturbance: Secondary | ICD-10-CM | POA: Diagnosis not present

## 2017-06-05 LAB — CBC WITH DIFFERENTIAL/PLATELET
BASOS ABS: 0 10*3/uL (ref 0.0–0.1)
BASOS PCT: 0 %
EOS PCT: 3 %
Eosinophils Absolute: 0.2 10*3/uL (ref 0.0–0.7)
HCT: 38.9 % (ref 36.0–46.0)
Hemoglobin: 12.1 g/dL (ref 12.0–15.0)
LYMPHS ABS: 2.9 10*3/uL (ref 0.7–4.0)
Lymphocytes Relative: 45 %
MCH: 29.4 pg (ref 26.0–34.0)
MCHC: 31.1 g/dL (ref 30.0–36.0)
MCV: 94.6 fL (ref 78.0–100.0)
MONO ABS: 0.6 10*3/uL (ref 0.1–1.0)
Monocytes Relative: 9 %
Neutro Abs: 2.7 10*3/uL (ref 1.7–7.7)
Neutrophils Relative %: 43 %
PLATELETS: 201 10*3/uL (ref 150–400)
RBC: 4.11 MIL/uL (ref 3.87–5.11)
RDW: 15.7 % — AB (ref 11.5–15.5)
WBC: 6.3 10*3/uL (ref 4.0–10.5)

## 2017-06-05 LAB — BASIC METABOLIC PANEL
ANION GAP: 10 (ref 5–15)
BUN: 16 mg/dL (ref 6–20)
CALCIUM: 9.7 mg/dL (ref 8.9–10.3)
CO2: 27 mmol/L (ref 22–32)
Chloride: 104 mmol/L (ref 101–111)
Creatinine, Ser: 0.98 mg/dL (ref 0.44–1.00)
GFR, EST AFRICAN AMERICAN: 54 mL/min — AB (ref >60)
GFR, EST NON AFRICAN AMERICAN: 47 mL/min — AB (ref >60)
Glucose, Bld: 71 mg/dL (ref 65–99)
Potassium: 3.8 mmol/L (ref 3.5–5.1)
Sodium: 141 mmol/L (ref 135–145)

## 2017-06-07 ENCOUNTER — Ambulatory Visit (HOSPITAL_COMMUNITY): Payer: Medicare Other | Admitting: Anesthesiology

## 2017-06-07 ENCOUNTER — Ambulatory Visit (HOSPITAL_COMMUNITY)
Admission: RE | Admit: 2017-06-07 | Discharge: 2017-06-07 | Disposition: A | Discharge status: Home / Self Care | Payer: Medicare Other | Source: Ambulatory Visit | Attending: General Surgery | Admitting: General Surgery

## 2017-06-07 ENCOUNTER — Encounter (HOSPITAL_COMMUNITY)
Admission: RE | Disposition: A | Discharge status: Home / Self Care | Payer: Self-pay | Source: Ambulatory Visit | Attending: General Surgery

## 2017-06-07 DIAGNOSIS — L989 Disorder of the skin and subcutaneous tissue, unspecified: Secondary | ICD-10-CM | POA: Diagnosis not present

## 2017-06-07 DIAGNOSIS — F0390 Unspecified dementia without behavioral disturbance: Secondary | ICD-10-CM | POA: Diagnosis not present

## 2017-06-07 DIAGNOSIS — D3610 Benign neoplasm of peripheral nerves and autonomic nervous system, unspecified: Secondary | ICD-10-CM | POA: Insufficient documentation

## 2017-06-07 DIAGNOSIS — N6489 Other specified disorders of breast: Secondary | ICD-10-CM | POA: Diagnosis not present

## 2017-06-07 DIAGNOSIS — D3614 Benign neoplasm of peripheral nerves and autonomic nervous system of thorax: Secondary | ICD-10-CM | POA: Diagnosis not present

## 2017-06-07 DIAGNOSIS — Z7982 Long term (current) use of aspirin: Secondary | ICD-10-CM | POA: Diagnosis not present

## 2017-06-07 DIAGNOSIS — N649 Disorder of breast, unspecified: Secondary | ICD-10-CM | POA: Diagnosis not present

## 2017-06-07 DIAGNOSIS — Z79899 Other long term (current) drug therapy: Secondary | ICD-10-CM | POA: Diagnosis not present

## 2017-06-07 DIAGNOSIS — I10 Essential (primary) hypertension: Secondary | ICD-10-CM | POA: Insufficient documentation

## 2017-06-07 HISTORY — PX: MASS EXCISION: SHX2000

## 2017-06-07 SURGERY — EXCISION MASS
Anesthesia: Monitor Anesthesia Care | Laterality: Right

## 2017-06-07 MED ORDER — MIDAZOLAM HCL 2 MG/2ML IJ SOLN
INTRAMUSCULAR | Status: AC
  Filled 2017-06-07: qty 2

## 2017-06-07 MED ORDER — LIDOCAINE HCL (PF) 1 % IJ SOLN
INTRAMUSCULAR | Status: DC | PRN
  Administered 2017-06-07: 5 mL

## 2017-06-07 MED ORDER — TRAMADOL HCL 50 MG PO TABS: 50.0000 mg | ORAL_TABLET | Freq: Four times a day (QID) | ORAL | 0 refills | Status: AC | PRN

## 2017-06-07 MED ORDER — CHLORHEXIDINE GLUCONATE CLOTH 2 % EX PADS: 6.0000 | MEDICATED_PAD | Freq: Once | CUTANEOUS | Status: DC

## 2017-06-07 MED ORDER — LACTATED RINGERS IV SOLN
INTRAVENOUS | Status: DC
  Administered 2017-06-07: 1000 mL via INTRAVENOUS

## 2017-06-07 MED ORDER — 0.9 % SODIUM CHLORIDE (POUR BTL) OPTIME
TOPICAL | Status: DC | PRN
  Administered 2017-06-07: 1000 mL

## 2017-06-07 MED ORDER — GLYCOPYRROLATE 0.2 MG/ML IJ SOLN
INTRAMUSCULAR | Status: AC
  Filled 2017-06-07: qty 1

## 2017-06-07 MED ORDER — BUPIVACAINE HCL (PF) 0.5 % IJ SOLN
INTRAMUSCULAR | Status: AC
  Filled 2017-06-07: qty 30

## 2017-06-07 MED ORDER — CEFAZOLIN SODIUM-DEXTROSE 2-4 GM/100ML-% IV SOLN
2.0000 g | INTRAVENOUS | Status: AC
  Administered 2017-06-07: 2 g via INTRAVENOUS

## 2017-06-07 MED ORDER — MIDAZOLAM HCL 5 MG/5ML IJ SOLN
INTRAMUSCULAR | Status: DC | PRN
  Administered 2017-06-07: 1 mg via INTRAVENOUS

## 2017-06-07 MED ORDER — POVIDONE-IODINE 10 % EX OINT
TOPICAL_OINTMENT | CUTANEOUS | Status: AC
  Filled 2017-06-07: qty 1

## 2017-06-07 MED ORDER — PROPOFOL 500 MG/50ML IV EMUL
INTRAVENOUS | Status: DC | PRN
  Administered 2017-06-07: 50 ug/kg/min via INTRAVENOUS

## 2017-06-07 MED ORDER — CEFAZOLIN SODIUM-DEXTROSE 2-4 GM/100ML-% IV SOLN
INTRAVENOUS | Status: AC
  Filled 2017-06-07: qty 100

## 2017-06-07 MED ORDER — GLYCOPYRROLATE 0.2 MG/ML IJ SOLN
0.2000 mg | Freq: Once | INTRAMUSCULAR | Status: AC
  Administered 2017-06-07: 0.2 mg via INTRAVENOUS

## 2017-06-07 MED ORDER — LIDOCAINE HCL (PF) 1 % IJ SOLN
INTRAMUSCULAR | Status: AC
  Filled 2017-06-07: qty 30

## 2017-06-07 MED ORDER — FENTANYL CITRATE (PF) 100 MCG/2ML IJ SOLN: 25.0000 ug | INTRAMUSCULAR | Status: DC | PRN

## 2017-06-07 MED ORDER — MIDAZOLAM HCL 2 MG/2ML IJ SOLN
1.0000 mg | INTRAMUSCULAR | Status: AC
  Administered 2017-06-07: 1 mg via INTRAVENOUS
  Filled 2017-06-07: qty 2

## 2017-06-07 MED ORDER — FENTANYL CITRATE (PF) 100 MCG/2ML IJ SOLN
INTRAMUSCULAR | Status: AC
  Filled 2017-06-07: qty 2

## 2017-06-07 SURGICAL SUPPLY — 40 items
BAG HAMPER (MISCELLANEOUS) ×3 IMPLANT
BLADE SURG SZ11 CARB STEEL (BLADE) IMPLANT
CHLORAPREP W/TINT 10.5 ML (MISCELLANEOUS) ×3 IMPLANT
CLOTH BEACON ORANGE TIMEOUT ST (SAFETY) ×3 IMPLANT
COVER LIGHT HANDLE STERIS (MISCELLANEOUS) ×6 IMPLANT
DECANTER SPIKE VIAL GLASS SM (MISCELLANEOUS) ×3 IMPLANT
DERMABOND ADVANCED (GAUZE/BANDAGES/DRESSINGS) ×2
DERMABOND ADVANCED .7 DNX12 (GAUZE/BANDAGES/DRESSINGS) ×1 IMPLANT
DRAPE EENT ADH APERT 31X51 STR (DRAPES) IMPLANT
ELECT NEEDLE TIP 2.8 STRL (NEEDLE) IMPLANT
ELECT REM PT RETURN 9FT ADLT (ELECTROSURGICAL) ×3
ELECTRODE REM PT RTRN 9FT ADLT (ELECTROSURGICAL) ×1 IMPLANT
FORMALIN 10 PREFIL 120ML (MISCELLANEOUS) ×3 IMPLANT
GAUZE SPONGE 4X4 12PLY STRL (GAUZE/BANDAGES/DRESSINGS) IMPLANT
GLOVE BIO SURGEON STRL SZ 6.5 (GLOVE) ×2 IMPLANT
GLOVE BIO SURGEONS STRL SZ 6.5 (GLOVE) ×1
GLOVE BIOGEL PI IND STRL 6.5 (GLOVE) ×1 IMPLANT
GLOVE BIOGEL PI IND STRL 7.0 (GLOVE) ×1 IMPLANT
GLOVE BIOGEL PI INDICATOR 6.5 (GLOVE) ×2
GLOVE BIOGEL PI INDICATOR 7.0 (GLOVE) ×2
GOWN STRL REUS W/ TWL XL LVL3 (GOWN DISPOSABLE) ×1 IMPLANT
GOWN STRL REUS W/TWL LRG LVL3 (GOWN DISPOSABLE) ×3 IMPLANT
GOWN STRL REUS W/TWL XL LVL3 (GOWN DISPOSABLE) ×2
KIT ROOM TURNOVER APOR (KITS) ×3 IMPLANT
MANIFOLD NEPTUNE II (INSTRUMENTS) ×3 IMPLANT
NEEDLE 22X1 1/2 OR ONLY (MISCELLANEOUS)
NEEDLE 22X1.5 STRL (OR ONLY) (MISCELLANEOUS) IMPLANT
NEEDLE HYPO 25X1 1.5 SAFETY (NEEDLE) ×3 IMPLANT
NS IRRIG 1000ML POUR BTL (IV SOLUTION) ×3 IMPLANT
PACK MINOR (CUSTOM PROCEDURE TRAY) IMPLANT
PAD ARMBOARD 7.5X6 YLW CONV (MISCELLANEOUS) ×3 IMPLANT
SET BASIN LINEN APH (SET/KITS/TRAYS/PACK) ×3 IMPLANT
SUT ETHILON 3 0 FSL (SUTURE) IMPLANT
SUT PROLENE 4 0 PS 2 18 (SUTURE) IMPLANT
SUT SILK 3 0 (SUTURE) ×2
SUT SILK 3-0 FS1 18XBRD (SUTURE) ×1 IMPLANT
SUT VIC AB 3-0 SH 27 (SUTURE) ×2
SUT VIC AB 3-0 SH 27X BRD (SUTURE) ×1 IMPLANT
SUT VIC AB 4-0 PS2 27 (SUTURE) ×3 IMPLANT
SYR CONTROL 10ML LL (SYRINGE) ×3 IMPLANT

## 2017-06-07 NOTE — Anesthesia Postprocedure Evaluation (Signed)
Anesthesia Post Note  Patient: Katrina Berry  Procedure(s) Performed: EXCISION SKIN LESION OF RIGHT BREAST (Right )  Patient location during evaluation: PACU Anesthesia Type: MAC Level of consciousness: awake and alert and oriented Pain management: pain level controlled Vital Signs Assessment: post-procedure vital signs reviewed and stable Respiratory status: spontaneous breathing Cardiovascular status: blood pressure returned to baseline and stable Postop Assessment: no apparent nausea or vomiting Anesthetic complications: no Comments: Late entry     Last Vitals:  Vitals:   06/07/17 1254 06/07/17 1301  BP: (!) 160/80   Pulse: (!) 50   Resp: 13   Temp:  36.6 C  SpO2: 96%     Last Pain:  Vitals:   06/07/17 1301  TempSrc: Oral  PainSc:                  [name]

## 2017-06-07 NOTE — Transfer of Care (Signed)
Immediate Anesthesia Transfer of Care Note  Patient: Katrina Berry  Procedure(s) Performed: EXCISION SKIN LESION OF RIGHT BREAST (Right )  Patient Location: PACU  Anesthesia Type:MAC  Level of Consciousness: awake and alert   Airway & Oxygen Therapy: Patient Spontanous Breathing and Patient connected to nasal cannula oxygen  Post-op Assessment: Report given to RN  Post vital signs: Reviewed and stable  Last Vitals:  Vitals:   06/07/17 1052 06/07/17 1100  BP:    Resp: 13 15  Temp:    SpO2: 95% 100%    Last Pain:  Vitals:   06/07/17 0945  PainSc: 0-No pain         Complications: No apparent anesthesia complications

## 2017-06-07 NOTE — Interval H&P Note (Signed)
History and Physical Interval Note:  06/07/2017 11:39 AM  Katrina Berry  has presented today for surgery, with the diagnosis of skin lesion right breast  The various methods of treatment have been discussed with the patient and family. After consideration of risks, benefits and other options for treatment, the patient has consented to  Procedure(s): EXCISION SKIN LESION OF RIGHT BREAST (Right) as a surgical intervention .  The patient's history has been reviewed, patient examined, no change in status, stable for surgery.  I have reviewed the patient's chart and labs.  Questions were answered to the patient's satisfaction.     Virl Cagey

## 2017-06-07 NOTE — Discharge Instructions (Signed)
Discharge Instructions: Shower per your regular routine. Take tylenol and ibuprofen as needed for pain control, alternating every 4-6 hours.  Take tramadol as needed for severe pain. Take colace for constipation related to narcotic pain medication. Do not pick at the dermabond glue on your incision sites.  Cover with bandage if needed.     Excision of Skin Lesions, Care After Refer to this sheet in the next few weeks. These instructions provide you with information about caring for yourself after your procedure. Your health care provider may also give you more specific instructions. Your treatment has been planned according to current medical practices, but problems sometimes occur. Call your health care provider if you have any problems or questions after your procedure. What can I expect after the procedure? After your procedure, it is common to have pain or discomfort at the excision site. Follow these instructions at home:  Take over-the-counter and prescription medicines only as told by your health care provider.  Follow instructions from your health care provider about: ? How to take care of your excision site. You should keep the site clean, dry, and protected for at least 48 hours. ? When and how you should change your bandage (dressing). ? When you should remove your dressing. ? Removing whatever was used to close your excision site.  Check the excision area every day for signs of infection. Watch for: ? Redness, swelling, or pain. ? Fluid, blood, or pus.  For bleeding, apply gentle but firm pressure to the area using a folded towel for 20 minutes.  Avoid high-impact exercise and activities until the stitches (sutures) are removed or the area heals.  Follow instructions from your health care provider about how to minimize scarring. Avoid sun exposure until the area has healed. Scarring should lessen over time.  Keep all follow-up visits as told by your health care provider.  This is important. Contact a health care provider if:  You have a fever.  You have redness, swelling, or pain at the excision site.  You have fluid, blood, or pus coming from the excision site.  You have ongoing bleeding at the excision site.  You have pain that does not improve in 2-3 days after your procedure.  You notice skin irregularities or changes in sensation. This information is not intended to replace advice given to you by your health care provider. Make sure you discuss any questions you have with your health care provider. Document Released: 12/21/2014 Document Revised: 01/12/2016 Document Reviewed: 09/22/2014 Elsevier Interactive Patient Education  2018 [place] POST-ANESTHESIA  IMMEDIATELY FOLLOWING SURGERY:  Do not drive or operate machinery for the first twenty four hours after surgery.  Do not make any important decisions for twenty four hours after surgery or while taking narcotic pain medications or sedatives.  If you develop intractable nausea and vomiting or a severe headache please notify your doctor immediately.  FOLLOW-UP:  Please make an appointment with your surgeon as instructed. You do not need to follow up with anesthesia unless specifically instructed to do so.  WOUND CARE INSTRUCTIONS (if applicable):  Keep a dry clean dressing on the anesthesia/puncture wound site if there is drainage.  Once the wound has quit draining you may leave it open to air.  Generally you should leave the bandage intact for twenty four hours unless there is drainage.  If the epidural site drains for more than 36-48 hours please call the anesthesia department.  QUESTIONS?:  Please feel free  to call your physician or the hospital operator if you have any questions, and they will be happy to assist you.

## 2017-06-07 NOTE — Op Note (Addendum)
[facility] Operative Note  06/07/17  Preoperative Diagnosis: Right breast skin lesion   Postoperative Diagnosis: Same   Procedure(s) Performed:  Excisional of skin lesion measuring 1.0 cm    Surgeon: Lanell Matar. Constance Haw, MD   Assistants: No qualified resident was available   Anesthesia: Monitored anesthesia care    Anesthesiologist: Lerry Liner, MD    Specimens:  Right breast skin lesion, single short superior/ double long lateral    Estimated Blood Loss: Minimal   Blood Replacement: None    Complications: None   Wound Class: Clean   Operative Indications: [name] is a 83 yo with dementia who was found to have a new skin lesion on her right medial breast. Her niece cares for her and does not recall when this lesion started but reports it has been growing and is bothersome.  The patient has been known to pick at the area, and has threatened to cut the lesion off herself.  Per report from the niece the patient had mammograms in the past that were negative, and given the patient's age and co-morbidities, she would like this lesion removed to identify what it is, but does not want to seek any aggressive measures or larger surgeries if this were a cancer. The risk and benefits were discussed with the niece who opted to proceed.   Findings: Pedunculated lesion on right breast, foul smelling drainage superficially, no signs of deep infection, limited to skin    Procedure: The patient was taken to the operating room and placed supine. Monitored anesthesia was induced. Intravenous antibiotics were administered per protocol.  The area of the right breast was prepared and draped in the usual sterile fashion.  Lidocaine 1% was infused in the area. An elliptical skin incision was made around the lesion, and carried down through the skin with electrocautery.  The lesion did not appear to extend past the dermis.  The underlying breast tissue cauterized off the dermal layer.   Hemostasis was achieved. The area was closed with 3-0 interrupted Vicryls in the deep dermal layer and a running subcutaneous 4-0 Vicryl and Dermabond to the skin.   All counts were correct at the end of the case. The patient was awakened from anesthesia without complication.  The patient went to the PACU in stable condition.   Curlene Labrum, MD [facility] [address] [place], [place] [address] [phone] (office)

## 2017-06-07 NOTE — Anesthesia Preprocedure Evaluation (Signed)
Anesthesia Evaluation  Patient identified by MRN, date of birth, ID band Patient awake    Reviewed: Allergy & Precautions, NPO status , Patient's Chart, lab work & pertinent test results  Airway Mallampati: III  TM Distance: >3 FB     Dental  (+) Teeth Intact   Pulmonary neg pulmonary ROS,    breath sounds clear to auscultation       Cardiovascular hypertension, Pt. on medications  Rhythm:Regular Rate:Normal     Neuro/Psych PSYCHIATRIC DISORDERS (dementia)    GI/Hepatic negative GI ROS, Neg liver ROS,   Endo/Other  negative endocrine ROS  Renal/GU negative Renal ROS     Musculoskeletal  (+) Arthritis ,   Abdominal   Peds  Hematology   Anesthesia Other Findings   Reproductive/Obstetrics                             Anesthesia Physical Anesthesia Plan  ASA: III  Anesthesia Plan: MAC   Post-op Pain Management:    Induction: Intravenous  PONV Risk Score and Plan:   Airway Management Planned: Simple Face Mask  Additional Equipment:   Intra-op Plan:   Post-operative Plan:   Informed Consent: I have reviewed the patients History and Physical, chart, labs and discussed the procedure including the risks, benefits and alternatives for the proposed anesthesia with the patient or authorized representative who has indicated his/her understanding and acceptance.     Plan Discussed with:   Anesthesia Plan Comments:         Anesthesia Quick Evaluation

## 2017-06-10 ENCOUNTER — Encounter (HOSPITAL_COMMUNITY): Payer: Self-pay | Admitting: General Surgery

## 2017-06-20 ENCOUNTER — Encounter: Payer: Self-pay | Admitting: General Surgery

## 2017-06-20 ENCOUNTER — Ambulatory Visit (INDEPENDENT_AMBULATORY_CARE_PROVIDER_SITE_OTHER): Payer: Self-pay | Admitting: General Surgery

## 2017-06-20 DIAGNOSIS — D3610 Benign neoplasm of peripheral nerves and autonomic nervous system, unspecified: Secondary | ICD-10-CM

## 2017-06-20 NOTE — Patient Instructions (Signed)
Cutaneous Neurofibroma Your Diagnosis Your doctor has determined that you have one or more cutaneous neurofibromas, which are benign skin growths that arise from nerve connective tissue. They are not cancer and do not normally increase the risk of developing skin cancer. Cutaneous neurofibromas can occur at any time of life, although they often appear between the ages of 85 and 33.  About the Condition The skin is the largest organ of the body. Its top layer is the epidermis, which provides protection against the environment. The second layer of the skin is the dermis, which supplies blood, oxygen, strength and support. Underneath the dermis is the hypodermis, or subcutaneous fat layer, which provides an ongoing blood supply to the dermis. Cutaneous neurofibromas occur when cells in the covering of a nerve (the nerve sheath) in the skin form a slow-growing, soft and usually painless growth, or tumor. These normally benign, or harmless, tumors can vary in size, are flesh-toned and have broad or stalk-like bases. Caf au lait spots, which look similar to the color of coffee with milk and can range in size from small to very large, are another type of cutaneous neurofibroma. They typically develop on the chest, back, pelvis, elbows and knees, although they can appear on many other parts of the body. In addition to cutaneous neurofibromas, there are 3 other main types of neurofibromas, which can occur anywhere in the body except the brain and spinal cord: ? Subcutaneous neurofibromas, which lie deeper under the skin and can sometimes be tender ? Deep nodular neurofibromas, which involve tissues and organs underneath the dermis ? Diffuse plexiform neurofibromas, which have branches that penetrate normal tissue Treatment Options The usual treatment for cutaneous neurofibromas is removal by a doctor, which may be accomplished in one of a few ways. In some cases, reconstructive surgery using a  skin flap or graft may be required. The following treatment possibilities are available: Excision - Cutting out cutaneous neurofibromas with a scalpel (excision) is the most often used method of removal. In some cases it may also be necessary to remove the nerve. Radiation Therapy - Radiation therapy can reduce cutaneous neurofibromas by using a high energy X-ray machine to direction radiation at the tumors. Patient Diagnosis Resource for  CUTANEOUS NEUROFIBROMAS What You Can Do Cutaneous neurofibromas rarely come back after they are removed. If you have any new growths or related abnormal sensations such as tingling, promptly report them to your doctor. To maximize your health and minimize your risk of ever developing skin cancer, you should strive to prevent skin damage from ultraviolet (UV) ray exposure. General steps you can take include: ? Avoiding the sun, especially between 10 a.m. and 4 p.m. when UV rays are the strongest ? Using SPF 15 or higher sunscreen that contains avobenzone [facility]), titanium dioxide and/or zinc oxide, applying it 20 minutes before going outdoors and again every 2 hours, or immediately after swimming or sweating ? Wearing a wide-brimmed hat and 100% UV-blocking sunglasses when outdoors ? Avoiding tanning salons and other UV tanning devices ? Visiting your doctor regularly for skin cancer screenings  Additional Resources American Academy of [employer]at[employer], [phone], http://jones-macias.info/

## 2017-06-20 NOTE — Progress Notes (Signed)
[facility] Note   HPI:  83 y.o. Female presents to clinic for post-op follow-up after excision of a right breat skin lesion. She has been doing well and has had no issues according to her care giver. She is feeling well and moving around.  Review of Systems:  No fevers or chills No drainage or redness at incision site  All other review of systems: otherwise negative   Pathology:  Diagnosis Skin , right breast skin lesion - NEUROFIBROMA. - SEE COMMENT. Microscopic Comment The tumor cells are positive for S-100 and focally positive for CD34. They are essentially negative for calponin, CD31,cytokeratin 903, cytokeratin AE1/AE3, Factor XIIIa, p63, HMB45, melan-A, smooth muscle actin, and smooth muscle myosin. The findings are consistent with neurofibroma.  Vital Signs:  BP (!) 147/69   Pulse (!) 58   Temp (!) 96 F (35.6 C)   Resp 18   Ht 5\' 2"  (1.575 m)   Wt 149 lb (67.6 kg)   BMI 27.25 kg/m    Physical Exam:  Physical Exam  Constitutional: She is well-developed, well-nourished, and in no distress.  Cardiovascular: Normal rate.   Pulmonary/Chest: Effort normal.  Musculoskeletal:  Right breast incision, clean and intact, no erythema or drainage  Vitals reviewed.   Laboratory studies: None    Imaging:  None   Assessment:  83 y.o. 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 She is doing well and having no issues. Discussed with her niece that this is a benign skin lesion and can arise sporadically. Discussed if someone had multiple of these type lesions or if it ran in the family there are hereditary disorders but this is not the case for Ms. Yolanda Bonine.  No further surgical intervention needed.   Plan:  - Follow up PRN   All of the above recommendations were discussed with the patient's family, and all the family's questions were answered to her expressed satisfaction.  Curlene Labrum, MD [facility] [address] [place], [place] [address] [phone] (office)

## 2017-06-25 DIAGNOSIS — I739 Peripheral vascular disease, unspecified: Secondary | ICD-10-CM | POA: Diagnosis not present

## 2017-06-25 DIAGNOSIS — B351 Tinea unguium: Secondary | ICD-10-CM | POA: Diagnosis not present

## 2017-07-03 ENCOUNTER — Other Ambulatory Visit: Payer: Self-pay | Admitting: Family Medicine

## 2017-07-04 NOTE — Telephone Encounter (Signed)
Seen 10 10 18

## 2017-07-23 ENCOUNTER — Encounter: Payer: Self-pay | Admitting: Family Medicine

## 2017-09-03 DIAGNOSIS — B351 Tinea unguium: Secondary | ICD-10-CM | POA: Diagnosis not present

## 2017-09-03 DIAGNOSIS — I739 Peripheral vascular disease, unspecified: Secondary | ICD-10-CM | POA: Diagnosis not present

## 2017-10-16 ENCOUNTER — Other Ambulatory Visit: Payer: Self-pay | Admitting: Family Medicine

## 2017-10-28 ENCOUNTER — Encounter: Payer: Self-pay | Admitting: Family Medicine

## 2017-10-29 ENCOUNTER — Encounter: Payer: Self-pay | Admitting: Family Medicine

## 2017-10-29 ENCOUNTER — Ambulatory Visit (INDEPENDENT_AMBULATORY_CARE_PROVIDER_SITE_OTHER): Payer: Medicare Other | Admitting: Family Medicine

## 2017-10-29 VITALS — BP 164/80 | HR 88 | Resp 16 | Ht 62.0 in | Wt 146.0 lb

## 2017-10-29 DIAGNOSIS — E785 Hyperlipidemia, unspecified: Secondary | ICD-10-CM

## 2017-10-29 DIAGNOSIS — F0150 Vascular dementia without behavioral disturbance: Secondary | ICD-10-CM

## 2017-10-29 DIAGNOSIS — I10 Essential (primary) hypertension: Secondary | ICD-10-CM | POA: Diagnosis not present

## 2017-10-29 NOTE — Patient Instructions (Addendum)
Wellness with nurse mid August  MD follow up early November, call if you need me before  Keep up the humor  No changes in medication  Happy BIRTHDAY!!! You are wonderful!!!!!!    You will need fasting labs  before your November visit , please call for the order in October

## 2017-11-03 ENCOUNTER — Encounter: Payer: Self-pay | Admitting: Family Medicine

## 2017-11-03 NOTE — Assessment & Plan Note (Signed)
Elevated systolic blood pressure, however based on age , no me diction change Low salt and increased vegetable and fruit

## 2017-11-03 NOTE — Progress Notes (Signed)
   Katrina Berry     MRN: [id number]      DOB: 02-03-1918   HPI History is provided by her niece and caregiver , she is incapable Katrina Berry is here for follow up and re-evaluation of chronic medical conditions, medication management and review of any available recent lab and radiology data.  .   Questions or concerns regarding consultations or procedures which the PT has had in the interim are  Addressed. Excellent recovery from right breast lumpectomy with no complications  denies any adverse reactions to current medications since the last visit.  There are no new concerns.  There are no specific complaints   ROS Good appetite, regular Bm, urine is not malodorous Denies recent fever or chills. Denies sinus pressure, nasal congestion, ear pain or sore throat. Denies chest congestion, productive cough or wheezing. Denies chest pains, palpitations and leg swelling Denies abdominal pain, nausea, vomiting,diarrhea or constipation.   Denies dysuria, frequency, hesitancy or incontinence. Denies uncontrolled  joint pain, swelling and does have  limitation in mobility. . Denies skin break down or rash.   PE  BP (!) 164/80   Pulse 88   Resp 16   Ht 5\' 2"  (1.575 m)   Wt 146 lb (66.2 kg)   BMI 26.70 kg/m   Patient alert and chronically dis - oriented and in no cardiopulmonary distress.  HEENT: No facial asymmetry, EOMI,   oropharynx pink and moist.  Neck decreased ROM,  no JVD, no mass.  Chest: Clear to auscultation bilaterally.  CVS: S1, S2 no murmurs, no S3.Regular rate.  ABD: Soft non tender.   Ext: No edema  [id number]  ROM spine, shoulders, hips and knees.  Skin: Intact, no ulcerations or rash noted.  Psych: Good eye contact, pleasant  affect. Memory loss not anxious or depressed appearing.  CNS: CN 2-12 intact, power,  normal throughout.no focal deficits noted.   Assessment & Plan  Essential hypertension Elevated systolic blood pressure, however based on  age , no me diction change Low salt and increased vegetable and fruit  Dementia Severe dementia with no behavioral problems , stable on current medication and tolerating it well, continue same  Hyperlipidemia LDL goal <130 Hyperlipidemia:Low fat diet discussed and encouraged.   Lipid Panel  Lab Results  Component Value Date   CHOL 139 12/07/2016   HDL 52 12/07/2016   LDLCALC 66 12/07/2016   TRIG 105 12/07/2016   CHOLHDL 2.7 12/07/2016   Updated lab needed at/ before next visit.

## 2017-11-03 NOTE — Assessment & Plan Note (Signed)
Severe dementia with no behavioral problems , stable on current medication and tolerating it well, continue same

## 2017-11-03 NOTE — Assessment & Plan Note (Signed)
Hyperlipidemia:Low fat diet discussed and encouraged.   Lipid Panel  Lab Results  Component Value Date   CHOL 139 12/07/2016   HDL 52 12/07/2016   LDLCALC 66 12/07/2016   TRIG 105 12/07/2016   CHOLHDL 2.7 12/07/2016   Updated lab needed at/ before next visit.

## 2017-11-12 DIAGNOSIS — B351 Tinea unguium: Secondary | ICD-10-CM | POA: Diagnosis not present

## 2017-11-12 DIAGNOSIS — I739 Peripheral vascular disease, unspecified: Secondary | ICD-10-CM | POA: Diagnosis not present

## 2017-12-02 ENCOUNTER — Telehealth: Payer: Self-pay

## 2017-12-02 DIAGNOSIS — E785 Hyperlipidemia, unspecified: Secondary | ICD-10-CM

## 2017-12-02 DIAGNOSIS — I10 Essential (primary) hypertension: Secondary | ICD-10-CM

## 2017-12-02 NOTE — Telephone Encounter (Signed)
-----  Message from Fayrene Helper, MD sent at 11/03/2017 12:10 PM EDT ----- Regarding: lab needed end April please pls advise Gerdia and order fasting lipid , cmp and EGFR for end April, has not had since 11/2016 and is on lipitor Dx hyperlipid and htn  Thanks!

## 2017-12-23 DIAGNOSIS — I10 Essential (primary) hypertension: Secondary | ICD-10-CM | POA: Diagnosis not present

## 2017-12-23 DIAGNOSIS — E785 Hyperlipidemia, unspecified: Secondary | ICD-10-CM | POA: Diagnosis not present

## 2017-12-23 LAB — COMPLETE METABOLIC PANEL WITH GFR
AG Ratio: 1.5 (calc) (ref 1.0–2.5)
ALBUMIN MSPROF: 4 g/dL (ref 3.6–5.1)
ALT: 10 U/L (ref 6–29)
AST: 17 U/L (ref 10–35)
Alkaline phosphatase (APISO): 62 U/L (ref 33–130)
BUN / CREAT RATIO: 15 (calc) (ref 6–22)
BUN: 16 mg/dL (ref 7–25)
CALCIUM: 9.5 mg/dL (ref 8.6–10.4)
CO2: 31 mmol/L (ref 20–32)
CREATININE: 1.07 mg/dL — AB (ref 0.60–0.88)
Chloride: 105 mmol/L (ref 98–110)
GFR, EST AFRICAN AMERICAN: 50 mL/min/{1.73_m2} — AB (ref >=60)
GFR, EST NON AFRICAN AMERICAN: 43 mL/min/{1.73_m2} — AB (ref >=60)
GLUCOSE: 87 mg/dL (ref 65–99)
Globulin: 2.7 g/dL (calc) (ref 1.9–3.7)
Potassium: 4.1 mmol/L (ref 3.5–5.3)
Sodium: 142 mmol/L (ref 135–146)
TOTAL PROTEIN: 6.7 g/dL (ref 6.1–8.1)
Total Bilirubin: 0.5 mg/dL (ref 0.2–1.2)

## 2017-12-23 LAB — LIPID PANEL
Cholesterol: 200 mg/dL — ABNORMAL HIGH (ref <200)
HDL: 76 mg/dL (ref >50)
LDL CHOLESTEROL (CALC): 103 mg/dL — AB
NON-HDL CHOLESTEROL (CALC): 124 mg/dL (ref <130)
TRIGLYCERIDES: 112 mg/dL (ref <150)
Total CHOL/HDL Ratio: 2.6 (calc) (ref <5.0)

## 2017-12-27 ENCOUNTER — Other Ambulatory Visit: Payer: Self-pay | Admitting: Family Medicine

## 2017-12-27 NOTE — Telephone Encounter (Signed)
Left message requesting  call back.

## 2018-01-21 DIAGNOSIS — I739 Peripheral vascular disease, unspecified: Secondary | ICD-10-CM | POA: Diagnosis not present

## 2018-01-21 DIAGNOSIS — B351 Tinea unguium: Secondary | ICD-10-CM | POA: Diagnosis not present

## 2018-03-31 ENCOUNTER — Ambulatory Visit (INDEPENDENT_AMBULATORY_CARE_PROVIDER_SITE_OTHER): Payer: Medicare Other

## 2018-03-31 VITALS — BP 140/74 | HR 52 | Resp 16 | Ht 62.0 in | Wt 147.0 lb

## 2018-03-31 DIAGNOSIS — Z0000 Encounter for general adult medical examination without abnormal findings: Secondary | ICD-10-CM

## 2018-03-31 NOTE — Progress Notes (Signed)
Subjective:   Katrina Berry is a 84 y.o. female who presents for Medicare Annual (Subsequent) preventive examination.  Review of Systems:   Cardiac Risk Factors include: advanced age (>70men, >30 women);dyslipidemia;hypertension     Objective:     Vitals: BP 140/74   Pulse (!) 52   Resp 16   Ht 5\' 2"  (1.575 m)   Wt 147 lb (66.7 kg)   SpO2 96%   BMI 26.89 kg/m   Body mass index is 26.89 kg/m.  Advanced Directives 03/31/2018 06/07/2017 06/05/2017 11/16/2016 02/07/2016  Does Patient Have a Medical Advance Directive? No No No No No  Would patient like information on creating a medical advance directive? No - Patient declined No - Patient declined No - Patient declined No - Patient declined No - patient declined information    Tobacco Social History   Tobacco Use  Smoking Status Never Smoker  Smokeless Tobacco Never Used     Counseling given: Not Answered   Clinical Intake:  Pre-visit preparation completed: No  Pain : No/denies pain Pain Score: 0-No pain     Nutritional Status: BMI 25 -29 Overweight Diabetes: No  How often do you need to have someone help you when you read instructions, pamphlets, or other written materials from your doctor or pharmacy?: 4 - Often What is the last grade level you completed in school?: 12th grade- college   Interpreter Needed?: No  Information entered by :: Katrina Plante LPN  Past Medical History:  Diagnosis Date  . Arthritis    mild, no known falls uses cane  . Dementia   . Hypertension    Past Surgical History:  Procedure Laterality Date  . MASS EXCISION Right 06/07/2017   Procedure: EXCISION SKIN LESION OF RIGHT BREAST;  Surgeon: Katrina Cagey, MD;  Location: [facility];  Service: General;  Laterality: Right;  . NO PAST SURGERIES     Family History  Problem Relation Age of Onset  . Diabetes Sister   . Hypertension Sister    Social History   Socioeconomic History  . Marital status: Widowed    Spouse name:  Not on file  . Number of children: 0  . Years of education: Not on file  . Highest education level: Not on file  Occupational History  . Not on file  Social Needs  . Financial resource strain: Not hard at all  . Food insecurity:    Worry: Never true    Inability: Never true  . Transportation needs:    Medical: No    Non-medical: No  Tobacco Use  . Smoking status: Never Smoker  . Smokeless tobacco: Never Used  Substance and Sexual Activity  . Alcohol use: No  . Drug use: No  . Sexual activity: Not Currently  Lifestyle  . Physical activity:    Days per week: 0 days    Minutes per session: 0 min  . Stress: Not at all  Relationships  . Social connections:    Talks on phone: More than three times a week    Gets together: Once a week    Attends religious service: 1 to 4 times per year    Active member of club or organization: No    Attends meetings of clubs or organizations: Never    Relationship status: Widowed  Other Topics Concern  . Not on file  Social History Narrative  . Not on file    Outpatient Encounter Medications as of 03/31/2018  Medication Sig  .  acetaminophen (TYLENOL) 500 MG tablet Take 500 mg by mouth every 6 (six) hours as needed for mild pain or headache.   Marland Kitchen aspirin EC 81 MG tablet Take 1 tablet (81 mg total) by mouth daily.  Marland Kitchen atorvastatin (LIPITOR) 10 MG tablet TAKE 1 TABLET (10 MG TOTAL) BY MOUTH DAILY.  . Calcium Carb-Cholecalciferol (CALCIUM 600+D3) 600-800 MG-UNIT TABS Take 1 tablet by mouth daily.  . cetirizine (ZYRTEC) 10 MG tablet Take 10 mg by mouth daily as needed for allergies.   Marland Kitchen donepezil (ARICEPT) 10 MG tablet TAKE 1 TABLET (10 MG TOTAL) BY MOUTH AT BEDTIME.  Marland Kitchen losartan (COZAAR) 100 MG tablet TAKE 1 TABLET (100 MG TOTAL) BY MOUTH DAILY.  . montelukast (SINGULAIR) 10 MG tablet Take 1 tablet (10 mg total) by mouth at bedtime.  . Multiple Vitamin (MULTIVITAMIN WITH MINERALS) TABS tablet Take 1 tablet by mouth daily.  . polyethylene glycol  (MIRALAX / GLYCOLAX) packet Take 17 g by mouth daily as needed for mild constipation.   . traMADol (ULTRAM) 50 MG tablet Take 1 tablet (50 mg total) by mouth every 6 (six) hours as needed for severe pain.   No facility-administered encounter medications on file as of 03/31/2018.     Activities of Daily Living In your present state of health, do you have any difficulty performing the following activities: 03/31/2018 06/05/2017  Hearing? Y N  Vision? N N  Difficulty concentrating or making decisions? Y Y  Comment - sometimes  Walking or climbing stairs? Y N  Dressing or bathing? Y N  Doing errands, shopping? Y N  Preparing Food and eating ? Y -  Using the Toilet? Y -  Comment uses pullups  -  In the past six months, have you accidently leaked urine? Y -  Do you have problems with loss of bowel control? N -  Managing your Medications? Y -  Managing your Finances? Y -  Housekeeping or managing your Housekeeping? Y -  Some recent data might be hidden    Patient Care Team: Katrina Helper, MD as PCP - General (Family Medicine)    Assessment:   This is a routine wellness examination for Katrina Berry.  Exercise Activities and Dietary recommendations Current Exercise Habits: The patient does not participate in regular exercise at present, Exercise limited by: Other - see comments(patient is 62)  Goals   None     Fall Risk Fall Risk  03/31/2018 10/29/2017 11/21/2016 08/04/2015  Falls in the past year? No No No No   Is the patient's home free of loose throw rugs in walkways, pet beds, electrical cords, etc?   yes      Grab bars in the bathroom? yes      Handrails on the stairs?   yes      Adequate lighting?   yes  Timed Get Up and Go performed:   Depression Screen PHQ 2/9 Scores 03/31/2018 11/21/2016 08/04/2015  PHQ - 2 Score 0 0 0     Cognitive Function MMSE - Mini Mental State Exam 07/07/2015  Orientation to time 2  Orientation to Place 2  Registration 3  Attention/  Calculation 5  Recall 0  Language- name 2 objects 2  Language- repeat 0  Language- follow 3 step command 3  Language- read & follow direction 1  Write a sentence 1  Copy design 0  Total score 19     6CIT Screen 03/31/2018  What Year? 4 points  What month? 3 points  What time?  3 points  Count back from 20 4 points  Months in reverse 4 points  Repeat phrase 6 points  Total Score 24    Immunization History  Administered Date(s) Administered  . Influenza,inj,Quad PF,6+ Mos 07/07/2015, 05/10/2016, 05/29/2017  . Pneumococcal Conjugate-13 12/25/2016  . Tdap 07/07/2015    Qualifies for Shingles Vaccine? Ask insurance if covered   Screening Tests Health Maintenance  Topic Date Due  . DEXA SCAN  10/31/1984  . PNA vac Low Risk Adult (2 of 2 - PPSV23) 12/25/2017  . INFLUENZA VACCINE  03/20/2018  . TETANUS/TDAP  07/06/2025    Cancer Screenings: Lung: Low Dose CT Chest recommended if Age 60-80 years, 30 pack-year currently smoking OR have quit w/in 15years. Patient does not qualify. Breast:  Up to date on Mammogram? Yes   Up to date of Bone Density/Dexa? Yes Colorectal: no longer indicated   Additional Screenings:  Hepatitis C Screening:      Plan:      I have personally reviewed and noted the following in the patient's chart:   . Medical and social history . Use of alcohol, tobacco or illicit drugs  . Current medications and supplements . Functional ability and status . Nutritional status . Physical activity . Advanced directives . List of other physicians . Hospitalizations, surgeries, and ER visits in previous 12 months . Vitals . Screenings to include cognitive, depression, and falls . Referrals and appointments  In addition, I have reviewed and discussed with patient certain preventive protocols, quality metrics, and best practice recommendations. A written personalized care plan for preventive services as well as general preventive health recommendations were  provided to patient.     Kate Sable, LPN, LPN  3/79/0240

## 2018-03-31 NOTE — Patient Instructions (Signed)
Ms. Katrina Berry , Thank you for taking time to come for your Medicare Wellness Visit. I appreciate your ongoing commitment to your health goals. Please review the following plan we discussed and let me know if I can assist you in the future.   Screening recommendations/referrals: Colonoscopy: not indicated  Mammogram: not indicated  Bone Density: not indicated  Recommended yearly ophthalmology/optometry visit for glaucoma screening and checkup Recommended yearly dental visit for hygiene and checkup  Vaccinations: Influenza vaccine: Due in Sept  Pneumococcal vaccine: due  Tdap vaccine: up to date Shingles vaccine: ask insurance if covered     Advanced directives: declined   Conditions/risks identified: done   Next appointment: scheduled    Preventive Care 38 Years and Older, Female Preventive care refers to lifestyle choices and visits with your health care provider that can promote health and wellness. What does preventive care include?  A yearly physical exam. This is also called an annual well check.  Dental exams once or twice a year.  Routine eye exams. Ask your health care provider how often you should have your eyes checked.  Personal lifestyle choices, including:  Daily care of your teeth and gums.  Regular physical activity.  Eating a healthy diet.  Avoiding tobacco and drug use.  Limiting alcohol use.  Practicing safe sex.  Taking low-dose aspirin every day.  Taking vitamin and mineral supplements as recommended by your health care provider. What happens during an annual well check? The services and screenings done by your health care provider during your annual well check will depend on your age, overall health, lifestyle risk factors, and family history of disease. Counseling  Your health care provider may ask you questions about your:  Alcohol use.  Tobacco use.  Drug use.  Emotional well-being.  Home and relationship well-being.  Sexual  activity.  Eating habits.  History of falls.  Memory and ability to understand (cognition).  Work and work Statistician.  Reproductive health. Screening  You may have the following tests or measurements:  Height, weight, and BMI.  Blood pressure.  Lipid and cholesterol levels. These may be checked every 5 years, or more frequently if you are over 55 years old.  Skin check.  Lung cancer screening. You may have this screening every year starting at age 16 if you have a 30-pack-year history of smoking and currently smoke or have quit within the past 15 years.  Fecal occult blood test (FOBT) of the stool. You may have this test every year starting at age 85.  Flexible sigmoidoscopy or colonoscopy. You may have a sigmoidoscopy every 5 years or a colonoscopy every 10 years starting at age 64.  Hepatitis C blood test.  Hepatitis B blood test.  Sexually transmitted disease (STD) testing.  Diabetes screening. This is done by checking your blood sugar (glucose) after you have not eaten for a while (fasting). You may have this done every 1-3 years.  Bone density scan. This is done to screen for osteoporosis. You may have this done starting at age 92.  Mammogram. This may be done every 1-2 years. Talk to your health care provider about how often you should have regular mammograms. Talk with your health care provider about your test results, treatment options, and if necessary, the need for more tests. Vaccines  Your health care provider may recommend certain vaccines, such as:  Influenza vaccine. This is recommended every year.  Tetanus, diphtheria, and acellular pertussis (Tdap, Td) vaccine. You may need a Td booster every  10 years.  Zoster vaccine. You may need this after age 78.  Pneumococcal 13-valent conjugate (PCV13) vaccine. One dose is recommended after age 65.  Pneumococcal polysaccharide (PPSV23) vaccine. One dose is recommended after age 30. Talk to your health care  provider about which screenings and vaccines you need and how often you need them. This information is not intended to replace advice given to you by your health care provider. Make sure you discuss any questions you have with your health care provider. Document Released: 09/02/2015 Document Revised: 04/25/2016 Document Reviewed: 06/07/2015 Elsevier Interactive Patient Education  2017 [place] Prevention in the Home Falls can cause injuries. They can happen to people of all ages. There are many things you can do to make your home safe and to help prevent falls. What can I do on the outside of my home?  Regularly fix the edges of walkways and driveways and fix any cracks.  Remove anything that might make you trip as you walk through a door, such as a raised step or threshold.  Trim any bushes or trees on the path to your home.  Use bright outdoor lighting.  Clear any walking paths of anything that might make someone trip, such as rocks or tools.  Regularly check to see if handrails are loose or broken. Make sure that both sides of any steps have handrails.  Any raised decks and porches should have guardrails on the edges.  Have any leaves, snow, or ice cleared regularly.  Use sand or salt on walking paths during winter.  Clean up any spills in your garage right away. This includes oil or grease spills. What can I do in the bathroom?  Use night lights.  Install grab bars by the toilet and in the tub and shower. Do not use towel bars as grab bars.  Use non-skid mats or decals in the tub or shower.  If you need to sit down in the shower, use a plastic, non-slip stool.  Keep the floor dry. Clean up any water that spills on the floor as soon as it happens.  Remove soap buildup in the tub or shower regularly.  Attach bath mats securely with double-sided non-slip rug tape.  Do not have throw rugs and other things on the floor that can make you trip. What can I do in  the bedroom?  Use night lights.  Make sure that you have a light by your bed that is easy to reach.  Do not use any sheets or blankets that are too big for your bed. They should not hang down onto the floor.  Have a firm chair that has side arms. You can use this for support while you get dressed.  Do not have throw rugs and other things on the floor that can make you trip. What can I do in the kitchen?  Clean up any spills right away.  Avoid walking on wet floors.  Keep items that you use a lot in easy-to-reach places.  If you need to reach something above you, use a strong step stool that has a grab bar.  Keep electrical cords out of the way.  Do not use floor polish or wax that makes floors slippery. If you must use wax, use non-skid floor wax.  Do not have throw rugs and other things on the floor that can make you trip. What can I do with my stairs?  Do not leave any items on the stairs.  Make sure  that there are handrails on both sides of the stairs and use them. Fix handrails that are broken or loose. Make sure that handrails are as long as the stairways.  Check any carpeting to make sure that it is firmly attached to the stairs. Fix any carpet that is loose or worn.  Avoid having throw rugs at the top or bottom of the stairs. If you do have throw rugs, attach them to the floor with carpet tape.  Make sure that you have a light switch at the top of the stairs and the bottom of the stairs. If you do not have them, ask someone to add them for you. What else can I do to help prevent falls?  Wear shoes that:  Do not have high heels.  Have rubber bottoms.  Are comfortable and fit you well.  Are closed at the toe. Do not wear sandals.  If you use a stepladder:  Make sure that it is fully opened. Do not climb a closed stepladder.  Make sure that both sides of the stepladder are locked into place.  Ask someone to hold it for you, if possible.  Clearly mark and  make sure that you can see:  Any grab bars or handrails.  First and last steps.  Where the edge of each step is.  Use tools that help you move around (mobility aids) if they are needed. These include:  Canes.  Walkers.  Scooters.  Crutches.  Turn on the lights when you go into a dark area. Replace any light bulbs as soon as they burn out.  Set up your furniture so you have a clear path. Avoid moving your furniture around.  If any of your floors are uneven, fix them.  If there are any pets around you, be aware of where they are.  Review your medicines with your doctor. Some medicines can make you feel dizzy. This can increase your chance of falling. Ask your doctor what other things that you can do to help prevent falls. This information is not intended to replace advice given to you by your health care provider. Make sure you discuss any questions you have with your health care provider. Document Released: 06/02/2009 Document Revised: 01/12/2016 Document Reviewed: 09/10/2014 Elsevier Interactive Patient Education  2017 [employer].

## 2018-04-01 DIAGNOSIS — B351 Tinea unguium: Secondary | ICD-10-CM | POA: Diagnosis not present

## 2018-04-01 DIAGNOSIS — I739 Peripheral vascular disease, unspecified: Secondary | ICD-10-CM | POA: Diagnosis not present

## 2018-04-16 ENCOUNTER — Other Ambulatory Visit: Payer: Self-pay | Admitting: Family Medicine

## 2018-06-17 DIAGNOSIS — I739 Peripheral vascular disease, unspecified: Secondary | ICD-10-CM | POA: Diagnosis not present

## 2018-06-17 DIAGNOSIS — B351 Tinea unguium: Secondary | ICD-10-CM | POA: Diagnosis not present

## 2018-06-23 ENCOUNTER — Ambulatory Visit: Payer: Medicare Other | Admitting: Family Medicine

## 2018-06-25 ENCOUNTER — Encounter: Payer: Self-pay | Admitting: Family Medicine

## 2018-06-25 ENCOUNTER — Ambulatory Visit (INDEPENDENT_AMBULATORY_CARE_PROVIDER_SITE_OTHER): Payer: Medicare Other | Admitting: Family Medicine

## 2018-06-25 VITALS — BP 161/71 | HR 56 | Resp 12 | Ht 62.0 in | Wt 148.1 lb

## 2018-06-25 DIAGNOSIS — F0150 Vascular dementia without behavioral disturbance: Secondary | ICD-10-CM

## 2018-06-25 DIAGNOSIS — Z9181 History of falling: Secondary | ICD-10-CM

## 2018-06-25 DIAGNOSIS — E785 Hyperlipidemia, unspecified: Secondary | ICD-10-CM | POA: Diagnosis not present

## 2018-06-25 DIAGNOSIS — I10 Essential (primary) hypertension: Secondary | ICD-10-CM | POA: Diagnosis not present

## 2018-06-25 DIAGNOSIS — Z23 Encounter for immunization: Secondary | ICD-10-CM

## 2018-06-25 NOTE — Patient Instructions (Signed)
F/U with MD in 6 months, call if you need me before  Pneumonia 23 and flu vaccine today  Labs today  Be careful not to fall   Happy Thanksgiving, MerryChristmas , Best for 2020

## 2018-06-26 ENCOUNTER — Encounter: Payer: Self-pay | Admitting: Family Medicine

## 2018-06-26 LAB — CBC
HEMATOCRIT: 36.9 % (ref 35.0–45.0)
HEMOGLOBIN: 12.5 g/dL (ref 11.7–15.5)
MCH: 30.9 pg (ref 27.0–33.0)
MCHC: 33.9 g/dL (ref 32.0–36.0)
MCV: 91.1 fL (ref 80.0–100.0)
MPV: 12.3 fL (ref 7.5–12.5)
Platelets: 175 10*3/uL (ref 140–400)
RBC: 4.05 10*6/uL (ref 3.80–5.10)
RDW: 13.8 % (ref 11.0–15.0)
WBC: 6.6 10*3/uL (ref 3.8–10.8)

## 2018-06-26 LAB — LIPID PANEL
CHOLESTEROL: 175 mg/dL (ref <200)
HDL: 70 mg/dL (ref >50)
LDL Cholesterol (Calc): 81 mg/dL (calc)
Non-HDL Cholesterol (Calc): 105 mg/dL (calc) (ref <130)
TRIGLYCERIDES: 137 mg/dL (ref <150)
Total CHOL/HDL Ratio: 2.5 (calc) (ref <5.0)

## 2018-06-26 LAB — COMPLETE METABOLIC PANEL WITH GFR
AG Ratio: 1.6 (calc) (ref 1.0–2.5)
ALBUMIN MSPROF: 4.1 g/dL (ref 3.6–5.1)
ALT: 14 U/L (ref 6–29)
AST: 21 U/L (ref 10–35)
Alkaline phosphatase (APISO): 77 U/L (ref 33–130)
BUN / CREAT RATIO: 16 (calc) (ref 6–22)
BUN: 15 mg/dL (ref 7–25)
CO2: 27 mmol/L (ref 20–32)
CREATININE: 0.91 mg/dL — AB (ref 0.60–0.88)
Calcium: 9.5 mg/dL (ref 8.6–10.4)
Chloride: 108 mmol/L (ref 98–110)
GFR, EST AFRICAN AMERICAN: 61 mL/min/{1.73_m2} (ref >=60)
GFR, Est Non African American: 53 mL/min/{1.73_m2} — ABNORMAL LOW (ref >=60)
GLUCOSE: 99 mg/dL (ref 65–99)
Globulin: 2.6 g/dL (calc) (ref 1.9–3.7)
Potassium: 4 mmol/L (ref 3.5–5.3)
Sodium: 142 mmol/L (ref 135–146)
TOTAL PROTEIN: 6.7 g/dL (ref 6.1–8.1)
Total Bilirubin: 0.5 mg/dL (ref 0.2–1.2)

## 2018-06-28 ENCOUNTER — Encounter: Payer: Self-pay | Admitting: Family Medicine

## 2018-06-28 NOTE — Assessment & Plan Note (Signed)
Adequately controled, no change in management DASH diet and commitment to daily physical activity for a minimum of 30 minutes discussed and encouraged, as a part of hypertension management. The importance of attaining a healthy weight is also discussed.  BP/Weight 06/25/2018 03/31/2018 10/29/2017 06/20/2017 06/07/2017 06/05/2017 38/46/6599  Systolic BP 357 017 793 903 009 - 233  Diastolic BP 71 74 80 69 76 - 64  Wt. (Lbs) 148.12 147 146 149 - 148 148  BMI 27.09 26.89 26.7 27.25 - 26.22 26.22

## 2018-06-28 NOTE — Assessment & Plan Note (Signed)
Hyperlipidemia:Low fat diet discussed and encouraged.   Lipid Panel  Lab Results  Component Value Date   CHOL 175 06/25/2018   HDL 70 06/25/2018   LDLCALC 81 06/25/2018   TRIG 137 06/25/2018   CHOLHDL 2.5 06/25/2018   Controlled, no change in medication

## 2018-06-28 NOTE — Progress Notes (Signed)
   Katrina Berry     MRN: [id number]      DOB: 1918/03/21   HPI History is from her niece as patient is unable due to dementia  Katrina Berry is here for follow up and re-evaluation of chronic medical conditions, medication management and review of any available recent lab and radiology data.  Preventive health is updated, specifically  Immunization.   The PT denies any adverse reactions to current medications since the last visit.  There are no new concerns.  There are no specific complaints   ROS Denies recent fever or chills. Denies sinus pressure, nasal congestion, ear pain or sore throat. Denies chest congestion, productive cough or wheezing. Denies chest pains, palpitations and leg swelling Denies abdominal pain, nausea, vomiting,diarrhea or constipation.   Denies dysuria, frequency, hesitancy or incontinence. Denies uncontrolled  joint pain, swelling and limitation in mobility.chronic right shoulder stiffness Denies headaches, seizures, numbness, or tingling. Denies depression, anxiety or insomnia. Denies skin break down or rash.   PE  BP (!) 161/71 (BP Location: Left Arm, Patient Position: Sitting, Cuff Size: Normal)   Pulse (!) 56   Resp 12   Ht 5\' 2"  (1.575 m)   Wt 148 lb 1.9 oz (67.2 kg)   SpO2 96% Comment: room air  BMI 27.09 kg/m   Patient alert and oriented and in no cardiopulmonary distress.  HEENT: No facial asymmetry, EOMI,   oropharynx pink and moist.  Neck supple no JVD, no mass.  Chest: Clear to auscultation bilaterally.  CVS: S1, S2 no murmurs, no S3.Regular rate.  ABD: Soft non tender.   Ext: No edema  MS: Adequate though reduced  ROM spine, shoulders, hips and knees.  Skin: Intact, no ulcerations or rash noted.  Psych: Good eye contact, normal affect. Memory iloss not anxious or depressed appearing.  CNS: CN 2-12 intact, power,  normal throughout.no focal deficits noted.   Assessment & Plan  Essential hypertension Adequately  controled, no change in management DASH diet and commitment to daily physical activity for a minimum of 30 minutes discussed and encouraged, as a part of hypertension management. The importance of attaining a healthy weight is also discussed.  BP/Weight 06/25/2018 03/31/2018 10/29/2017 06/20/2017 06/07/2017 06/05/2017 19/16/6060  Systolic BP 045 997 741 423 953 - 202  Diastolic BP 71 74 80 69 76 - 64  Wt. (Lbs) 148.12 147 146 149 - 148 148  BMI 27.09 26.89 26.7 27.25 - 26.22 26.22       Need for vaccination for strep pneumoniae + influenza After obtaining informed consent, the vaccine is  administered by LPN.   Hyperlipidemia LDL goal <130 Hyperlipidemia:Low fat diet discussed and encouraged.   Lipid Panel  Lab Results  Component Value Date   CHOL 175 06/25/2018   HDL 70 06/25/2018   LDLCALC 81 06/25/2018   TRIG 137 06/25/2018   CHOLHDL 2.5 06/25/2018   Controlled, no change in medication     At high risk for falls Fall prevention and hone safety reviewed  Dementia Unchanged, no destructive behavior and pt remains content

## 2018-06-28 NOTE — Assessment & Plan Note (Signed)
After obtaining informed consent, the vaccine is  administered by LPN.  

## 2018-06-28 NOTE — Assessment & Plan Note (Signed)
Fall prevention and hone safety reviewed

## 2018-06-28 NOTE — Assessment & Plan Note (Signed)
Unchanged, no destructive behavior and pt remains content

## 2018-06-29 ENCOUNTER — Other Ambulatory Visit: Payer: Self-pay | Admitting: Family Medicine

## 2018-08-21 ENCOUNTER — Encounter: Payer: Self-pay | Admitting: Family Medicine

## 2018-09-02 DIAGNOSIS — I739 Peripheral vascular disease, unspecified: Secondary | ICD-10-CM | POA: Diagnosis not present

## 2018-09-02 DIAGNOSIS — B351 Tinea unguium: Secondary | ICD-10-CM | POA: Diagnosis not present

## 2018-09-18 ENCOUNTER — Encounter: Payer: Self-pay | Admitting: *Deleted

## 2018-10-12 ENCOUNTER — Other Ambulatory Visit: Payer: Self-pay | Admitting: Family Medicine

## 2018-12-23 ENCOUNTER — Other Ambulatory Visit: Payer: Self-pay | Admitting: Family Medicine

## 2018-12-23 ENCOUNTER — Ambulatory Visit (INDEPENDENT_AMBULATORY_CARE_PROVIDER_SITE_OTHER): Payer: Medicare Other | Admitting: Family Medicine

## 2018-12-23 ENCOUNTER — Encounter: Payer: Self-pay | Admitting: Family Medicine

## 2018-12-23 ENCOUNTER — Other Ambulatory Visit: Payer: Self-pay

## 2018-12-23 VITALS — BP 160/70 | HR 56 | Ht 62.0 in | Wt 148.0 lb

## 2018-12-23 DIAGNOSIS — I10 Essential (primary) hypertension: Secondary | ICD-10-CM

## 2018-12-23 DIAGNOSIS — Z9181 History of falling: Secondary | ICD-10-CM | POA: Diagnosis not present

## 2018-12-23 DIAGNOSIS — E785 Hyperlipidemia, unspecified: Secondary | ICD-10-CM | POA: Diagnosis not present

## 2018-12-23 DIAGNOSIS — M25511 Pain in right shoulder: Secondary | ICD-10-CM | POA: Diagnosis not present

## 2018-12-23 DIAGNOSIS — G8929 Other chronic pain: Secondary | ICD-10-CM

## 2018-12-23 NOTE — Patient Instructions (Addendum)
Wellness with nurse as before, call if you need me sooner  MD follow up in 6 months, call if you need me sooner  Please get fasting lipid, cmp and eGFR in August, lab order is mailed  Thankful you are doing well and helping to keep the household together!!  Please be careful not to fall, and copntinue to keep everyone happy and laughing!!!  No changes in your medications  Social distancing. Frequent hand washing with soap and water Keeping your hands off of your face.wearing a face mask and keeping 6 ft away from the next person when out in public These 3 practices will help to keep both you and your community healthy during this time. Please practice them faithfully!  Thanks for choosing [facility], we consider it a privelige to serve you.

## 2018-12-29 ENCOUNTER — Encounter: Payer: Self-pay | Admitting: Family Medicine

## 2018-12-29 NOTE — Assessment & Plan Note (Signed)
Not at goal but no medication change DASH diet and commitment to daily physical activity for a minimum of 30 minutes discussed and encouraged, as a part of hypertension management. The importance of attaining a healthy weight is also discussed.  BP/Weight 12/23/2018 06/25/2018 03/31/2018 10/29/2017 06/20/2017 06/07/2017 43/15/4008  Systolic BP 676 195 093 267 124 580 -  Diastolic BP 70 71 74 80 69 76 -  Wt. (Lbs) 148 148.12 147 146 149 - 148  BMI 27.07 27.09 26.89 26.7 27.25 - 26.22

## 2018-12-29 NOTE — Progress Notes (Signed)
Virtual Visit via Telephone Note  I connected with Bonnee Quin on 12/29/18 at  2:20 PM EDT by telephone and verified that I am speaking with the correct person using two identifiers.  Location: Patient: home Provider: office   I discussed the limitations, risks, security and privacy concerns of performing an evaluation and management service by telephone and the availability of in person appointments. I also discussed with the patient that there may be a patient responsible charge related to this service. The patient expressed understanding and agreed to proceed.   History of Present Illness: F/u chronic problems and medication review History is from her caregiver , her niece as the pt is incapable Reportedly doing well with no new concerns, no falls , appetite is good and no skin breakdown Denies recent fever or chills. Denies sinus pressure, nasal congestion, ear pain or sore throat. Denies chest congestion, productive cough or wheezing. Denies orthopnea, leg swelling Denies abdominal pain, nausea, vomiting,diarrhea or constipation.   Denies dysuria, frequency, hesitancy or i malodorous urine Chronic shoulder  pain, swelling and limitation in mobility. Denies headaches, seizures, numbness, or tingling. Denies depression, anxiety or insomnia. Denies skin break down or rash.       Observations/Objective: BP (!) 160/70   Pulse (!) 56   Ht 5\' 2"  (1.575 m)   Wt 148 lb (67.1 kg)   BMI 27.07 kg/m    Assessment and Plan: Essential hypertension Not at goal but no medication change DASH diet and commitment to daily physical activity for a minimum of 30 minutes discussed and encouraged, as a part of hypertension management. The importance of attaining a healthy weight is also discussed.  BP/Weight 12/23/2018 06/25/2018 03/31/2018 10/29/2017 06/20/2017 06/07/2017 63/08/6008  Systolic BP 932 355 732 202 542 706 -  Diastolic BP 70 71 74 80 69 76 -  Wt. (Lbs) 148 148.12 147 146  149 - 148  BMI 27.07 27.09 26.89 26.7 27.25 - 26.22       Hyperlipidemia LDL goal <130 Hyperlipidemia:Low fat diet discussed and encouraged.   Lipid Panel  Lab Results  Component Value Date   CHOL 175 06/25/2018   HDL 70 06/25/2018   LDLCALC 81 06/25/2018   TRIG 137 06/25/2018   CHOLHDL 2.5 06/25/2018   Updated lab needed at/ before next visit. Controlled when last checked    Shoulder pain, right Chronic and unchanged but not disabling in that no crying in pain  At high risk for falls Home safety  Reviewed with caregiver and precautions are already in place, with no  H/o falls    Follow Up Instructions:    I discussed the assessment and treatment plan with the patient. The patient was provided an opportunity to ask questions and all were answered. The patient agreed with the plan and demonstrated an understanding of the instructions.   The patient was advised to call back or seek an in-person evaluation if the symptoms worsen or if the condition fails to improve as anticipated.  I provided 22  minutes of non-face-to-face time during this encounter.   Tula Nakayama, MD

## 2018-12-29 NOTE — Assessment & Plan Note (Signed)
Hyperlipidemia:Low fat diet discussed and encouraged.   Lipid Panel  Lab Results  Component Value Date   CHOL 175 06/25/2018   HDL 70 06/25/2018   LDLCALC 81 06/25/2018   TRIG 137 06/25/2018   CHOLHDL 2.5 06/25/2018   Updated lab needed at/ before next visit. Controlled when last checked

## 2018-12-29 NOTE — Assessment & Plan Note (Signed)
Chronic and unchanged but not disabling in that no crying in pain

## 2018-12-29 NOTE — Assessment & Plan Note (Addendum)
Home safety  Reviewed with caregiver and precautions are already in place, with no  H/o falls

## 2018-12-30 ENCOUNTER — Encounter: Payer: Self-pay | Admitting: *Deleted

## 2018-12-31 ENCOUNTER — Ambulatory Visit: Payer: Medicare Other | Admitting: Family Medicine

## 2019-01-15 ENCOUNTER — Ambulatory Visit (INDEPENDENT_AMBULATORY_CARE_PROVIDER_SITE_OTHER): Payer: Medicare Other | Admitting: Family Medicine

## 2019-01-15 ENCOUNTER — Encounter: Payer: Self-pay | Admitting: Family Medicine

## 2019-01-15 ENCOUNTER — Other Ambulatory Visit: Payer: Self-pay

## 2019-01-15 VITALS — BP 160/70 | Ht 62.0 in | Wt 148.0 lb

## 2019-01-15 DIAGNOSIS — E559 Vitamin D deficiency, unspecified: Secondary | ICD-10-CM

## 2019-01-15 DIAGNOSIS — G8929 Other chronic pain: Secondary | ICD-10-CM

## 2019-01-15 DIAGNOSIS — E785 Hyperlipidemia, unspecified: Secondary | ICD-10-CM

## 2019-01-15 DIAGNOSIS — I10 Essential (primary) hypertension: Secondary | ICD-10-CM

## 2019-01-15 DIAGNOSIS — M25511 Pain in right shoulder: Secondary | ICD-10-CM | POA: Diagnosis not present

## 2019-01-15 DIAGNOSIS — F0150 Vascular dementia without behavioral disturbance: Secondary | ICD-10-CM

## 2019-01-15 NOTE — Assessment & Plan Note (Signed)
Worsening symptoms, poor eating habits and spitting food and excessive times on commode, likely, sh is also get updated baseline labs which are past due a progression of the disease. Denies symptoms suggestive of infection and no behavioral issues Directed niece to dementia support group and pt to have her labs which are overdue

## 2019-01-15 NOTE — Assessment & Plan Note (Signed)
Hyperlipidemia:Low fat diet discussed and encouraged.   Lipid Panel  Lab Results  Component Value Date   CHOL 175 06/25/2018   HDL 70 06/25/2018   LDLCALC 81 06/25/2018   TRIG 137 06/25/2018   CHOLHDL 2.5 06/25/2018     Updated lab needed at/ before next visit.

## 2019-01-15 NOTE — Progress Notes (Signed)
Virtual Visit via Telephone Note  I connected with Bonnee Quin on 01/15/19 at  8:40 AM EDT by telephone and verified that I am speaking with the correct person using two identifiers.  Location: Patient: home Provider: office   I discussed the limitations, risks, security and privacy concerns of performing an evaluation and management service by telephone and the availability of in person appointments. I also discussed with the patient that there may be a patient responsible charge related to this service. The patient expressed understanding and agreed to proceed.  This visit type is conducted due to national recommendations for restrictions regarding the COVID -19 Pandemic. Due to the patient's age and / or co morbidities, this format is felt to be most appropriate at this time without adequate follow up. The patient has no access to video technology/ had technical difficulties with video, requiring transitioning to audio format  only ( telephone ). All issues noted this document were discussed and addressed,no physical exam can be performed in this format.   History of Present Illness: Niece  Notes for past  3 weeks, intermittently eats very little , spits out some of the food given as she is pocketing it, also intermittently spends longer mornings in bed and will spend excessively long periods on the commode over 1 hr at times No h/o violent behavior No recent fever, chills or cough No change in amount of conversation once awake, no new weakness ar facial assymetry noted   Observations/Objective:  BP (!) 160/70   Ht 5\' 2"  (1.575 m)   Wt 148 lb (67.1 kg)   BMI 27.07 kg/m    Assessment and Plan: Dementia Worsening symptoms, poor eating habits and spitting food and excessive times on commode, likely, sh is also get updated baseline labs which are past due a progression of the disease. Denies symptoms suggestive of infection and no behavioral issues Directed niece to dementia  support group and pt to have her labs which are overdue  Hyperlipidemia LDL goal <130 Hyperlipidemia:Low fat diet discussed and encouraged.   Lipid Panel  Lab Results  Component Value Date   CHOL 175 06/25/2018   HDL 70 06/25/2018   LDLCALC 81 06/25/2018   TRIG 137 06/25/2018   CHOLHDL 2.5 06/25/2018     Updated lab needed at/ before next visit.   Essential hypertension Sub optimal control, however currently on polypharmacy, no change in medication  Shoulder pain, right Chronic and unchanged    Follow Up Instructions:    I discussed the assessment and treatment plan with the patient. The patient was provided an opportunity to ask questions and all were answered. The patient agreed with the plan and demonstrated an understanding of the instructions.   The patient was advised to call back or seek an in-person evaluation if the symptoms worsen or if the condition fails to improve as anticipated.  I provided 22 minutes of non-face-to-face time during this encounter.   Tula Nakayama, MD

## 2019-01-15 NOTE — Patient Instructions (Addendum)
F/U in early September,( new appointment to be scheduled) call if you need me sooner   Please get fasting labs as soon as possible ( add CBC, TSH, fasting lipid, cmp and EGFR and Vit D level [facility], send in and mail please   Use Vaseline alternating with your protective skin cream to area of concern  Supplement diet with boost or ensure on the days that not eating much or not wanting to eat, work in a creative way with Ms Mobley as we discussed  Behavior is reflecting advancement of her dementia, please use resources available to you to help with this process

## 2019-01-15 NOTE — Assessment & Plan Note (Signed)
Chronic and unchanged ?

## 2019-01-15 NOTE — Assessment & Plan Note (Signed)
Sub optimal control, however currently on polypharmacy, no change in medication

## 2019-01-19 DIAGNOSIS — E785 Hyperlipidemia, unspecified: Secondary | ICD-10-CM | POA: Diagnosis not present

## 2019-01-19 DIAGNOSIS — I10 Essential (primary) hypertension: Secondary | ICD-10-CM | POA: Diagnosis not present

## 2019-01-19 DIAGNOSIS — E559 Vitamin D deficiency, unspecified: Secondary | ICD-10-CM | POA: Diagnosis not present

## 2019-01-20 LAB — COMPLETE METABOLIC PANEL WITH GFR
AG Ratio: 1.3 (calc) (ref 1.0–2.5)
ALT: 16 U/L (ref 6–29)
AST: 25 U/L (ref 10–35)
Albumin: 4 g/dL (ref 3.6–5.1)
Alkaline phosphatase (APISO): 71 U/L (ref 37–153)
BUN/Creatinine Ratio: 18 (calc) (ref 6–22)
BUN: 17 mg/dL (ref 7–25)
CO2: 26 mmol/L (ref 20–32)
Calcium: 9.5 mg/dL (ref 8.6–10.4)
Chloride: 108 mmol/L (ref 98–110)
Creat: 0.94 mg/dL — ABNORMAL HIGH (ref 0.60–0.88)
GFR, Est African American: 58 mL/min/{1.73_m2} — ABNORMAL LOW (ref >=60)
GFR, Est Non African American: 50 mL/min/{1.73_m2} — ABNORMAL LOW (ref >=60)
Globulin: 3 g/dL (calc) (ref 1.9–3.7)
Glucose, Bld: 78 mg/dL (ref 65–99)
Potassium: 4.4 mmol/L (ref 3.5–5.3)
Sodium: 143 mmol/L (ref 135–146)
Total Bilirubin: 0.6 mg/dL (ref 0.2–1.2)
Total Protein: 7 g/dL (ref 6.1–8.1)

## 2019-01-20 LAB — LIPID PANEL
Cholesterol: 200 mg/dL — ABNORMAL HIGH (ref <200)
HDL: 69 mg/dL (ref >=50)
LDL Cholesterol (Calc): 110 mg/dL (calc) — ABNORMAL HIGH
Non-HDL Cholesterol (Calc): 131 mg/dL (calc) — ABNORMAL HIGH (ref <130)
Total CHOL/HDL Ratio: 2.9 (calc) (ref <5.0)
Triglycerides: 107 mg/dL (ref <150)

## 2019-01-20 LAB — CBC
HCT: 38 % (ref 35.0–45.0)
Hemoglobin: 12.5 g/dL (ref 11.7–15.5)
MCH: 30.9 pg (ref 27.0–33.0)
MCHC: 32.9 g/dL (ref 32.0–36.0)
MCV: 94.1 fL (ref 80.0–100.0)
MPV: 11.7 fL (ref 7.5–12.5)
Platelets: 170 10*3/uL (ref 140–400)
RBC: 4.04 10*6/uL (ref 3.80–5.10)
RDW: 13.4 % (ref 11.0–15.0)
WBC: 5.6 10*3/uL (ref 3.8–10.8)

## 2019-01-20 LAB — VITAMIN D 25 HYDROXY (VIT D DEFICIENCY, FRACTURES): Vit D, 25-Hydroxy: 48 ng/mL (ref 30–100)

## 2019-01-20 LAB — TSH: TSH: 2.17 mIU/L (ref 0.40–4.50)

## 2019-03-13 ENCOUNTER — Encounter: Payer: Self-pay | Admitting: *Deleted

## 2019-04-02 ENCOUNTER — Ambulatory Visit: Payer: Medicare Other

## 2019-04-02 ENCOUNTER — Ambulatory Visit: Payer: Medicare Other | Admitting: Family Medicine

## 2019-04-03 ENCOUNTER — Ambulatory Visit: Payer: Medicare Other

## 2019-04-03 ENCOUNTER — Encounter: Payer: Medicare Other | Admitting: Family Medicine

## 2019-04-06 ENCOUNTER — Ambulatory Visit: Payer: Medicare Other

## 2019-04-07 ENCOUNTER — Other Ambulatory Visit: Payer: Self-pay | Admitting: Family Medicine

## 2019-04-08 ENCOUNTER — Ambulatory Visit: Payer: Medicare Other | Admitting: Family Medicine

## 2019-04-22 ENCOUNTER — Ambulatory Visit: Payer: Medicare Other | Admitting: Family Medicine

## 2019-04-28 ENCOUNTER — Ambulatory Visit: Payer: Medicare Other

## 2019-04-29 ENCOUNTER — Ambulatory Visit (INDEPENDENT_AMBULATORY_CARE_PROVIDER_SITE_OTHER): Payer: Medicare Other | Admitting: Family Medicine

## 2019-04-29 ENCOUNTER — Other Ambulatory Visit: Payer: Self-pay

## 2019-04-29 ENCOUNTER — Encounter: Payer: Self-pay | Admitting: Family Medicine

## 2019-04-29 VITALS — BP 130/80 | HR 53 | Temp 98.7°F | Resp 12 | Ht 62.0 in | Wt 146.1 lb

## 2019-04-29 DIAGNOSIS — Z23 Encounter for immunization: Secondary | ICD-10-CM | POA: Diagnosis not present

## 2019-04-29 DIAGNOSIS — E785 Hyperlipidemia, unspecified: Secondary | ICD-10-CM

## 2019-04-29 DIAGNOSIS — F0150 Vascular dementia without behavioral disturbance: Secondary | ICD-10-CM | POA: Diagnosis not present

## 2019-04-29 DIAGNOSIS — I10 Essential (primary) hypertension: Secondary | ICD-10-CM | POA: Diagnosis not present

## 2019-04-29 NOTE — Patient Instructions (Addendum)
    Thank you for coming into the office today. I appreciate the opportunity to provide you with the care for your health and wellness. Today we discussed: blood pressure and cholesterol  Follow Up: Jan 2021  Labs 1 week before, fasting  Continue all medications. Work around the foods she likes to eat.  Please continue to practice social distancing to keep you, your family, and our community safe.  If you must go out, please wear a Mask and practice good handwashing.  [place] YOUR HANDS WELL AND FREQUENTLY. AVOID TOUCHING YOUR FACE, UNLESS YOUR HANDS ARE FRESHLY WASHED.  GET FRESH AIR DAILY. STAY HYDRATED WITH WATER.   It was a pleasure to see you and I look forward to continuing to work together on your health and well-being. Please do not hesitate to call the office if you need care or have questions about your care.  Have a wonderful day and week. With Gratitude, Cherly Beach, DNP, AGNP-BC

## 2019-04-29 NOTE — Progress Notes (Signed)
Subjective:     Patient ID: Katrina Berry, female   DOB: Nov 05, 1917, 85 y.o.   MRN: [id number]  MERCEDE ODENTHAL presents for Hypertension (follow up) and Hyperlipidemia  Family member today present for appointment secondary to dementia.  Denies having any skin issues or skin breakdown.  Denies having any troubles with eating (occasionally will spit or chew something and then spit it out.  Could be random because of the texture or just not wanting that food.  But overall is eating well) or drinking.  Reports some minor incontinence that is baseline.  Reports confusion which is baseline.  No evidence of having falls.  Reports she uses her walker without issue.  Reports that she is sleeping well.  Here for follow-up of hypertension.  Dementia is limiting, but she does walk around the house and take care of herself. Exercising. She can be picky about the food, might chew or spit out.  Blood pressure is well controlled at home.  SBP usually ranges 120-130.   DBP usually ranges 80.  Cardiac symptoms: none. Patient denies: chest pain, chest pressure/discomfort, claudication, dyspnea, exertional chest pressure/discomfort, fatigue, irregular heart beat, lower extremity edema, near-syncope, orthopnea, palpitations, paroxysmal nocturnal dyspnea, syncope and tachypnea. Cardiovascular risk factors: advanced age (older than 77 for men, 32 for women), dyslipidemia, hypertension and sedentary lifestyle. Use of agents associated with hypertension: none.   Reports taking all medications as directed and denies side effects.  Today patient denies signs and symptoms of COVID 19 infection including fever, chills, cough, shortness of breath, and headache.  Past Medical, Surgical, Social History, Allergies, and Medications have been Reviewed.   Past Medical History:  Diagnosis Date   Arthritis    mild, no known falls uses cane   Dementia ([place])    Hypertension    Past Surgical History:  Procedure  Laterality Date   MASS EXCISION Right 06/07/2017   Procedure: EXCISION SKIN LESION OF RIGHT BREAST;  Surgeon: Virl Cagey, MD;  Location: [facility];  Service: General;  Laterality: Right;   NO PAST SURGERIES     Social History   Socioeconomic History   Marital status: Widowed    Spouse name: Not on file   Number of children: 0   Years of education: Not on file   Highest education level: Not on file  Occupational History   Not on file  Social Needs   Financial resource strain: Not hard at all   Food insecurity    Worry: Never true    Inability: Never true   Transportation needs    Medical: No    Non-medical: No  Tobacco Use   Smoking status: Never Smoker   Smokeless tobacco: Never Used  Substance and Sexual Activity   Alcohol use: No   Drug use: No   Sexual activity: Not Currently  Lifestyle   Physical activity    Days per week: 0 days    Minutes per session: 0 min   Stress: Not at all  Relationships   Social connections    Talks on phone: More than three times a week    Gets together: Once a week    Attends religious service: 1 to 4 times per year    Active member of club or organization: No    Attends meetings of clubs or organizations: Never    Relationship status: Widowed   Intimate partner violence    Fear of current or ex partner: No    Emotionally abused:  No    Physically abused: No    Forced sexual activity: No  Other Topics Concern   Not on file  Social History Narrative   Not on file    Outpatient Encounter Medications as of 04/29/2019  Medication Sig   acetaminophen (TYLENOL) 500 MG tablet Take 500 mg by mouth every 6 (six) hours as needed for mild pain or headache.    aspirin EC 81 MG tablet Take 1 tablet (81 mg total) by mouth daily.   atorvastatin (LIPITOR) 10 MG tablet TAKE 1 TABLET (10 MG TOTAL) BY MOUTH DAILY.   Calcium Carb-Cholecalciferol (CALCIUM 600+D3) 600-800 MG-UNIT TABS Take 1 tablet by mouth daily.    donepezil (ARICEPT) 10 MG tablet TAKE 1 TABLET BY MOUTH EVERYDAY AT BEDTIME   losartan (COZAAR) 100 MG tablet TAKE 1 TABLET BY MOUTH EVERY DAY   Multiple Vitamin (MULTIVITAMIN WITH MINERALS) TABS tablet Take 1 tablet by mouth daily.   No facility-administered encounter medications on file as of 04/29/2019.    No Known Allergies  Review of Systems  Constitutional: Negative for chills and fever.  HENT: Negative.   Eyes: Negative.   Respiratory: Negative.  Negative for cough and shortness of breath.   Cardiovascular: Negative.   Gastrointestinal: Negative.   Endocrine: Negative.   Genitourinary: Negative.   Musculoskeletal: Negative.   Skin: Negative.   Allergic/Immunologic: Negative.   Neurological: Negative.   Hematological: Negative.   Psychiatric/Behavioral: Negative.  Negative for sleep disturbance.  All other systems reviewed and are negative.      Objective:     BP 130/80    Pulse (!) 53    Temp 98.7 F (37.1 C) (Temporal)    Resp 12    Ht 5\' 2"  (1.575 m)    Wt 146 lb 1.3 oz (66.3 kg)    SpO2 92%    BMI 26.72 kg/m   Physical Exam Vitals signs and nursing note reviewed.  Constitutional:      Appearance: Normal appearance. She is well-developed, well-groomed and overweight.  HENT:     Head: Normocephalic and atraumatic.     Right Ear: External ear normal.     Left Ear: External ear normal.     Nose: Nose normal.     Mouth/Throat:     Mouth: Mucous membranes are moist.     Pharynx: Oropharynx is clear.  Eyes:     General:        Right eye: No discharge.        Left eye: No discharge.     Conjunctiva/sclera: Conjunctivae normal.     Comments: glasses  Neck:     Musculoskeletal: Normal range of motion and neck supple.  Cardiovascular:     Rate and Rhythm: Normal rate and regular rhythm.     Pulses: Normal pulses.     Heart sounds: Normal heart sounds.  Pulmonary:     Effort: Pulmonary effort is normal.     Breath sounds: Normal breath sounds.    Musculoskeletal: Normal range of motion.  Skin:    General: Skin is warm.  Neurological:     Mental Status: Mental status is at baseline.     Motor: Weakness present.     Gait: Gait abnormal.     Comments: Uses walker for ambulation   Psychiatric:        Mood and Affect: Mood normal.        Behavior: Behavior is cooperative.        Cognition and Memory: Cognition  is impaired. Memory is impaired.     Comments: pleasant in conversation, good eye contact        Assessment and Plan     1. Vascular dementia without behavioral disturbance (HCC) Stable, continue Aricept at this time.  Not having any behavioral disturbances.  Overall is eating and sleeping well.  2. Essential hypertension Controlled, continue medications.  We will be assessing labs 1 week prior to next appointment.  - COMPLETE METABOLIC PANEL WITH GFR - CBC  3. Hyperlipidemia LDL goal <130 Reports taking Lipitor without issue.  Denies having any muscle aches or pain.  We will be assessing lipid panel at next appointment.  Advised to fast prior to lab draw.  - Lipid panel  4. Need for immunization against influenza Patient was educated on the recommendation for flu vaccine. After obtaining informed consent, the vaccine was administered no adverse effects noted at time of administration. Patient provided with education on arm soreness and use of tylenol or ibuprofen (if safe) for this. Encourage to use the arm vaccine was given in to help reduce the soreness. Patient educated on the signs of a reaction to the vaccine and advised to contact the office should these occur.   - Flu Vaccine QUAD High Dose(Fluad)   Follow Up: Jan 2021 labs before  Perlie Mayo, DNP, AGNP-BC [address] [place], [place] [address] Office Hours: Mon-Thurs 8 am-5 pm; Fri 8 am-12 pm Office Phone:  [phone]  Office Fax: [phone]

## 2019-05-19 DIAGNOSIS — I739 Peripheral vascular disease, unspecified: Secondary | ICD-10-CM | POA: Diagnosis not present

## 2019-05-19 DIAGNOSIS — B351 Tinea unguium: Secondary | ICD-10-CM | POA: Diagnosis not present

## 2019-06-17 ENCOUNTER — Other Ambulatory Visit: Payer: Self-pay | Admitting: Family Medicine

## 2019-06-25 ENCOUNTER — Ambulatory Visit: Payer: Medicare Other | Admitting: Family Medicine

## 2019-09-02 ENCOUNTER — Other Ambulatory Visit: Payer: Self-pay

## 2019-09-02 ENCOUNTER — Ambulatory Visit: Payer: Medicare Other | Admitting: Family Medicine

## 2019-09-04 DIAGNOSIS — I10 Essential (primary) hypertension: Secondary | ICD-10-CM | POA: Diagnosis not present

## 2019-09-04 DIAGNOSIS — E785 Hyperlipidemia, unspecified: Secondary | ICD-10-CM | POA: Diagnosis not present

## 2019-09-05 LAB — CBC
HCT: 38.9 % (ref 35.0–45.0)
Hemoglobin: 12.7 g/dL (ref 11.7–15.5)
MCH: 30.9 pg (ref 27.0–33.0)
MCHC: 32.6 g/dL (ref 32.0–36.0)
MCV: 94.6 fL (ref 80.0–100.0)
MPV: 12.2 fL (ref 7.5–12.5)
Platelets: 180 10*3/uL (ref 140–400)
RBC: 4.11 10*6/uL (ref 3.80–5.10)
RDW: 13.5 % (ref 11.0–15.0)
WBC: 6.1 10*3/uL (ref 3.8–10.8)

## 2019-09-05 LAB — COMPLETE METABOLIC PANEL WITH GFR
AG Ratio: 1.3 (calc) (ref 1.0–2.5)
ALT: 17 U/L (ref 6–29)
AST: 24 U/L (ref 10–35)
Albumin: 4 g/dL (ref 3.6–5.1)
Alkaline phosphatase (APISO): 74 U/L (ref 37–153)
BUN/Creatinine Ratio: 19 (calc) (ref 6–22)
BUN: 18 mg/dL (ref 7–25)
CO2: 29 mmol/L (ref 20–32)
Calcium: 9.6 mg/dL (ref 8.6–10.4)
Chloride: 106 mmol/L (ref 98–110)
Creat: 0.97 mg/dL — ABNORMAL HIGH (ref 0.60–0.88)
GFR, Est African American: 56 mL/min/{1.73_m2} — ABNORMAL LOW (ref >=60)
GFR, Est Non African American: 48 mL/min/{1.73_m2} — ABNORMAL LOW (ref >=60)
Globulin: 3 g/dL (calc) (ref 1.9–3.7)
Glucose, Bld: 92 mg/dL (ref 65–99)
Potassium: 4.4 mmol/L (ref 3.5–5.3)
Sodium: 141 mmol/L (ref 135–146)
Total Bilirubin: 0.5 mg/dL (ref 0.2–1.2)
Total Protein: 7 g/dL (ref 6.1–8.1)

## 2019-09-05 LAB — LIPID PANEL
Cholesterol: 206 mg/dL — ABNORMAL HIGH (ref <200)
HDL: 64 mg/dL (ref >=50)
LDL Cholesterol (Calc): 117 mg/dL (calc) — ABNORMAL HIGH
Non-HDL Cholesterol (Calc): 142 mg/dL (calc) — ABNORMAL HIGH (ref <130)
Total CHOL/HDL Ratio: 3.2 (calc) (ref <5.0)
Triglycerides: 134 mg/dL (ref <150)

## 2019-09-14 ENCOUNTER — Encounter: Payer: Self-pay | Admitting: Family Medicine

## 2019-09-14 ENCOUNTER — Other Ambulatory Visit: Payer: Self-pay

## 2019-09-14 ENCOUNTER — Ambulatory Visit (INDEPENDENT_AMBULATORY_CARE_PROVIDER_SITE_OTHER): Payer: Medicare Other | Admitting: Family Medicine

## 2019-09-14 VITALS — BP 130/80 | HR 53 | Resp 15 | Ht 62.0 in | Wt 146.4 lb

## 2019-09-14 DIAGNOSIS — I10 Essential (primary) hypertension: Secondary | ICD-10-CM | POA: Diagnosis not present

## 2019-09-14 DIAGNOSIS — G8929 Other chronic pain: Secondary | ICD-10-CM | POA: Diagnosis not present

## 2019-09-14 DIAGNOSIS — M25511 Pain in right shoulder: Secondary | ICD-10-CM | POA: Diagnosis not present

## 2019-09-14 DIAGNOSIS — E785 Hyperlipidemia, unspecified: Secondary | ICD-10-CM

## 2019-09-14 DIAGNOSIS — Z9181 History of falling: Secondary | ICD-10-CM | POA: Diagnosis not present

## 2019-09-14 DIAGNOSIS — F0150 Vascular dementia without behavioral disturbance: Secondary | ICD-10-CM | POA: Diagnosis not present

## 2019-09-14 NOTE — Patient Instructions (Addendum)
F/u in office with MD earlty September, call if you need me before, call in July for lab  Order please  Please schedule  Wellness with Nurse   Congrats on excellent labs , no medication changes   Please be careful not to fall  Thanks for choosing [facility], we consider it a privelige to serve you.     Fall Prevention in the Home, Adult Falls can cause injuries. They can happen to people of all ages. There are many things you can do to make your home safe and to help prevent falls. Ask for help when making these changes, if needed. What actions can I take to prevent falls? General Instructions  Use good lighting in all rooms. Replace any light bulbs that burn out.  Turn on the lights when you go into a dark area. Use night-lights.  Keep items that you use often in easy-to-reach places. Lower the shelves around your home if necessary.  Set up your furniture so you have a clear path. Avoid moving your furniture around.  Do not have throw rugs and other things on the floor that can make you trip.  Avoid walking on wet floors.  If any of your floors are uneven, fix them.  Add color or contrast paint or tape to clearly mark and help you see: ? Any grab bars or handrails. ? First and last steps of stairways. ? Where the edge of each step is.  If you use a stepladder: ? Make sure that it is fully opened. Do not climb a closed stepladder. ? Make sure that both sides of the stepladder are locked into place. ? Ask someone to hold the stepladder for you while you use it.  If there are any pets around you, be aware of where they are. What can I do in the bathroom?      Keep the floor dry. Clean up any water that spills onto the floor as soon as it happens.  Remove soap buildup in the tub or shower regularly.  Use non-skid mats or decals on the floor of the tub or shower.  Attach bath mats securely with double-sided, non-slip rug tape.  If you need to sit down  in the shower, use a plastic, non-slip stool.  Install grab bars by the toilet and in the tub and shower. Do not use towel bars as grab bars. What can I do in the bedroom?  Make sure that you have a light by your bed that is easy to reach.  Do not use any sheets or blankets that are too big for your bed. They should not hang down onto the floor.  Have a firm chair that has side arms. You can use this for support while you get dressed. What can I do in the kitchen?  Clean up any spills right away.  If you need to reach something above you, use a strong step stool that has a grab bar.  Keep electrical cords out of the way.  Do not use floor polish or wax that makes floors slippery. If you must use wax, use non-skid floor wax. What can I do with my stairs?  Do not leave any items on the stairs.  Make sure that you have a light switch at the top of the stairs and the bottom of the stairs. If you do not have them, ask someone to add them for you.  Make sure that there are handrails on both sides of the  stairs, and use them. Fix handrails that are broken or loose. Make sure that handrails are as long as the stairways.  Install non-slip stair treads on all stairs in your home.  Avoid having throw rugs at the top or bottom of the stairs. If you do have throw rugs, attach them to the floor with carpet tape.  Choose a carpet that does not hide the edge of the steps on the stairway.  Check any carpeting to make sure that it is firmly attached to the stairs. Fix any carpet that is loose or worn. What can I do on the outside of my home?  Use bright outdoor lighting.  Regularly fix the edges of walkways and driveways and fix any cracks.  Remove anything that might make you trip as you walk through a door, such as a raised step or threshold.  Trim any bushes or trees on the path to your home.  Regularly check to see if handrails are loose or broken. Make sure that both sides of any steps  have handrails.  Install guardrails along the edges of any raised decks and porches.  Clear walking paths of anything that might make someone trip, such as tools or rocks.  Have any leaves, snow, or ice cleared regularly.  Use sand or salt on walking paths during winter.  Clean up any spills in your garage right away. This includes grease or oil spills. What other actions can I take?  Wear shoes that: ? Have a low heel. Do not wear high heels. ? Have rubber bottoms. ? Are comfortable and fit you well. ? Are closed at the toe. Do not wear open-toe sandals.  Use tools that help you move around (mobility aids) if they are needed. These include: ? Canes. ? Walkers. ? Scooters. ? Crutches.  Review your medicines with your doctor. Some medicines can make you feel dizzy. This can increase your chance of falling. Ask your doctor what other things you can do to help prevent falls. Where to find more information  Centers for Disease Control and Prevention, STEAD[employer]: https://garcia.biz/  [employer] on Aging: BrainJudge.co.uk Contact a doctor if:  You are afraid of falling at home.  You feel weak, drowsy, or dizzy at home.  You fall at home. Summary  There are many simple things that you can do to make your home safe and to help prevent falls.  Ways to make your home safe include removing tripping hazards and installing grab bars in the bathroom.  Ask for help when making these changes in your home. This information is not intended to replace advice given to you by your health care provider. Make sure you discuss any questions you have with your health care provider. Document Revised: 11/27/2018 Document Reviewed: 03/21/2017 Elsevier Patient Education  2020 [employer].

## 2019-09-14 NOTE — Progress Notes (Signed)
Virtual Visit via Telephone Note  I connected with Katrina Berry on 09/14/19 at  2:40 PM EST by telephone and verified that I am speaking with the correct person using two identifiers.  Location: Patient: home  Provider: office    I discussed the limitations, risks, security and privacy concerns of performing an evaluation and management service by telephone and the availability of in person appointments. I also discussed with the patient that there may be a patient responsible charge related to this service. The patient expressed understanding and agreed to proceed.   History of Present Illness: F/U chronic problems, medication review, and refill medication when necessary. Review most recent labs and order labs which are due Review preventive health and update with necessary referrals or immunizations as indicated History is from her niece who cares for her as patient has severe dementia No concerns, pt hasgood appetitie and reguilar bowel movements. No fall history Remains pleasantly demented Denies recent fever or chills. Denies sinus pressure, nasal congestion, ear pain or sore throat. Denies chest congestion, productive cough or wheezing. Denies  leg swelling Denies abdominal pain, nausea, vomiting,diarrhea or constipation.    Denies un controlled  joint pain, swelling and limitation in mobility. Denies headaches, seizures, numbness, or tingling.  Denies skin break down or rash.       Observations/Objective: BP 130/80   Pulse (!) 53   Resp 15   Ht 5\' 2"  (1.575 m)   Wt 146 lb 6.4 oz (66.4 kg)   BMI 26.78 kg/m  Good communication with no confusion and intact memory. Alert and oriented x 3 No signs of respiratory distress during speech    Assessment and Plan: Essential hypertension Controlled, no change in medication DASH diet and commitment to daily physical activity for a minimum of 30 minutes discussed and encouraged, as a part of hypertension  management. The importance of attaining a healthy weight is also discussed.  BP/Weight 09/14/2019 04/29/2019 01/15/2019 12/23/2018 06/25/2018 03/31/2018 [id number]  Systolic BP [id number] AB-123456789 0000000 0000000 [id number]  Diastolic BP 80 80 70 70 71 74 80  Wt. (Lbs) 146.4 146.08 148 148 148.12 147 146  BMI 26.78 26.72 27.07 27.07 27.09 26.89 26.7       Hyperlipidemia LDL goal <130 Hyperlipidemia:Low fat diet discussed and encouraged.   Lipid Panel  Lab Results  Component Value Date   CHOL 206 (H) 09/04/2019   HDL 64 09/04/2019   LDLCALC 117 (H) 09/04/2019   TRIG 134 09/04/2019   CHOLHDL 3.2 09/04/2019   Needs to reduce fried and fatty foods   Shoulder pain, right Chronic and unchanged  Dementia stable  At high risk for falls Home safety reviewed with caregiver    Follow Up Instructions:    I discussed the assessment and treatment plan with the patient. The patient was provided an opportunity to ask questions and all were answered. The patient agreed with the plan and demonstrated an understanding of the instructions.   The patient was advised to call back or seek an in-person evaluation if the symptoms worsen or if the condition fails to improve as anticipated.  I provided 12 minutes of non-face-to-face time during this encounter.   Tula Nakayama, MD

## 2019-09-17 ENCOUNTER — Encounter: Payer: Self-pay | Admitting: Family Medicine

## 2019-09-17 NOTE — Assessment & Plan Note (Signed)
stable °

## 2019-09-17 NOTE — Assessment & Plan Note (Signed)
Controlled, no change in medication DASH diet and commitment to daily physical activity for a minimum of 30 minutes discussed and encouraged, as a part of hypertension management. The importance of attaining a healthy weight is also discussed.  BP/Weight 09/14/2019 04/29/2019 01/15/2019 12/23/2018 06/25/2018 03/31/2018 [id number]  Systolic BP AB-[id number] AB-123456789 0000000 0000000 Q000111Q [id number]  Diastolic BP 80 80 70 70 71 74 80  Wt. (Lbs) 146.4 146.08 148 148 148.12 147 146  BMI 26.78 26.72 27.07 27.07 27.09 26.89 26.7

## 2019-09-17 NOTE — Assessment & Plan Note (Signed)
Chronic and unchanged ?

## 2019-09-17 NOTE — Assessment & Plan Note (Signed)
Home safety reviewed with caregiver

## 2019-09-17 NOTE — Assessment & Plan Note (Signed)
Hyperlipidemia:Low fat diet discussed and encouraged.   Lipid Panel  Lab Results  Component Value Date   CHOL 206 (H) 09/04/2019   HDL 64 09/04/2019   LDLCALC 117 (H) 09/04/2019   TRIG 134 09/04/2019   CHOLHDL 3.2 09/04/2019   Needs to reduce fried and fatty foods

## 2019-09-18 DIAGNOSIS — B351 Tinea unguium: Secondary | ICD-10-CM | POA: Diagnosis not present

## 2019-09-18 DIAGNOSIS — I739 Peripheral vascular disease, unspecified: Secondary | ICD-10-CM | POA: Diagnosis not present

## 2019-09-26 ENCOUNTER — Ambulatory Visit: Payer: Medicare Other

## 2019-10-01 ENCOUNTER — Other Ambulatory Visit: Payer: Self-pay | Admitting: Family Medicine

## 2019-10-04 ENCOUNTER — Ambulatory Visit: Payer: Medicare Other | Attending: Internal Medicine

## 2019-10-04 ENCOUNTER — Other Ambulatory Visit: Payer: Self-pay

## 2019-10-04 DIAGNOSIS — Z23 Encounter for immunization: Secondary | ICD-10-CM | POA: Insufficient documentation

## 2019-10-04 NOTE — Progress Notes (Signed)
   Covid-19 Vaccination Clinic  Name:  Katrina Berry    MRN: [id number] DOB: Aug 12, 1918  10/04/2019  [name] was observed post Covid-19 immunization for 15 minutes without incidence. She was provided with Vaccine Information Sheet and instruction to access the V-Safe system.   [name] was instructed to call 911 with any severe reactions post vaccine: Marland Kitchen Difficulty breathing  . Swelling of your face and throat  . A fast heartbeat  . A bad rash all over your body  . Dizziness and weakness    Immunizations Administered    Name Date Dose VIS Date Route   Moderna COVID-19 Vaccine 10/04/2019  3:00 PM 0.5 mL 07/21/2019 Intramuscular   Manufacturer: Moderna   Lot: [id number]   [id number]

## 2019-11-01 ENCOUNTER — Ambulatory Visit: Payer: Medicare Other | Attending: Internal Medicine

## 2019-11-01 DIAGNOSIS — Z23 Encounter for immunization: Secondary | ICD-10-CM

## 2019-11-01 NOTE — Progress Notes (Signed)
   Covid-19 Vaccination Clinic  Name:  LIVIE GRELLE    MRN: [id number] DOB: 1917-11-07  11/01/2019  Ms. Wigand was observed post Covid-19 immunization for 15 minutes without incident. She was provided with Vaccine Information Sheet and instruction to access the V-Safe system.   Ms. Micheletti was instructed to call 911 with any severe reactions post vaccine: Marland Kitchen Difficulty breathing  . Swelling of face and throat  . A fast heartbeat  . A bad rash all over body  . Dizziness and weakness   Immunizations Administered    Name Date Dose VIS Date Route   Moderna COVID-19 Vaccine 11/01/2019  1:36 PM 0.5 mL 07/21/2019 Intramuscular   Manufacturer: Moderna   Lot: [id number]   [id number]

## 2019-11-27 DIAGNOSIS — B351 Tinea unguium: Secondary | ICD-10-CM | POA: Diagnosis not present

## 2019-11-27 DIAGNOSIS — I739 Peripheral vascular disease, unspecified: Secondary | ICD-10-CM | POA: Diagnosis not present

## 2019-12-11 ENCOUNTER — Other Ambulatory Visit: Payer: Self-pay | Admitting: Family Medicine

## 2019-12-30 ENCOUNTER — Other Ambulatory Visit: Payer: Self-pay

## 2019-12-30 ENCOUNTER — Ambulatory Visit (INDEPENDENT_AMBULATORY_CARE_PROVIDER_SITE_OTHER): Payer: Medicare Other | Admitting: Family Medicine

## 2019-12-30 ENCOUNTER — Encounter: Payer: Self-pay | Admitting: Family Medicine

## 2019-12-30 VITALS — BP 140/70 | HR 80 | Resp 15 | Ht 62.0 in | Wt 130.0 lb

## 2019-12-30 DIAGNOSIS — Z9181 History of falling: Secondary | ICD-10-CM

## 2019-12-30 DIAGNOSIS — B372 Candidiasis of skin and nail: Secondary | ICD-10-CM

## 2019-12-30 DIAGNOSIS — I10 Essential (primary) hypertension: Secondary | ICD-10-CM | POA: Diagnosis not present

## 2019-12-30 DIAGNOSIS — E785 Hyperlipidemia, unspecified: Secondary | ICD-10-CM

## 2019-12-30 DIAGNOSIS — F0150 Vascular dementia without behavioral disturbance: Secondary | ICD-10-CM

## 2019-12-30 NOTE — Assessment & Plan Note (Signed)
Encouraged a low-fat heart healthy diet.  Continue Lipitor at this time.

## 2019-12-30 NOTE — Assessment & Plan Note (Signed)
Katrina Berry is encouraged to maintain a well balanced diet that is low in salt. Controlled, continue current medication regimen.   Additionally, she is also reminded that exercise is beneficial for heart health and control of  Blood pressure. 30-60 minutes daily is recommended-walking was suggested.

## 2019-12-30 NOTE — Assessment & Plan Note (Signed)
Lives with sister, some mild changes in memory overall doing well and is stable.  We will continue all current medications for this at this time.

## 2019-12-30 NOTE — Progress Notes (Signed)
Subjective:  Patient ID: Katrina Berry, female    DOB: 07-27-1918  Age: 86 y.o. MRN: [id number]  CC:  Chief Complaint  Patient presents with  . Form Completion    pcs services       HPI  HPI  Katrina Berry is here today for follow-up to make sure everything is doing well chronic conditions reviewed.  Presents here today with her niece who is also her caregiver.  Has a history of hyperlipidemia, hypertension, history of falling, dementia among others. Today her and her niece who is present reports that her memory has only changed slightly and she continues to take her medications without any issue. She denies have any trouble chewing, swallowing has all of her teeth except for 1 reports that she flosses and brushes her teeth very regularly. Wears glasses but has not been seen by an eye doctor in several years.  But she reports that she can see very well with them her knees reports that she can see even without them.  She sleeps through the night for the most part and does not have any trouble waking up or staying awake during the day. She uses a walker most the time and does not have any falls to report.   Has a good appetite, does not drink enough water though.  Denies have any chest pain, belly pain, leg discomfort or pain.  Denies having any changes in bowel or bladder habits, no blood in urine or stool.  Denies having any pain, denies having any UTI signs or symptoms, denies having any headaches, dizziness. Only issue is that there is some yeast collection underneath the breast folds.  Otherwise overall she is doing well and has no other complaints today in the office.  Today patient denies signs and symptoms of COVID 19 infection including fever, chills, cough, shortness of breath, and headache. Past Medical, Surgical, Social History, Allergies, and Medications have been Reviewed.   Past Medical History:  Diagnosis Date  . Allergic rhinitis 11/25/2016  . Arthritis    mild,  no known falls uses cane  . Dementia ([place])   . Hypertension   . Neurofibroma 05/29/2017   Right breast skin lesion- pathology neurofibroma     Current Meds  Medication Sig  . acetaminophen (TYLENOL) 500 MG tablet Take 500 mg by mouth every 6 (six) hours as needed for mild pain or headache.   Marland Kitchen aspirin EC 81 MG tablet Take 1 tablet (81 mg total) by mouth daily.  Marland Kitchen atorvastatin (LIPITOR) 10 MG tablet TAKE 1 TABLET BY MOUTH EVERY DAY  . Calcium Carb-Cholecalciferol (CALCIUM 600+D3) 600-800 MG-UNIT TABS Take 1 tablet by mouth daily.  Marland Kitchen donepezil (ARICEPT) 10 MG tablet TAKE 1 TABLET BY MOUTH EVERYDAY AT BEDTIME  . losartan (COZAAR) 100 MG tablet TAKE 1 TABLET BY MOUTH EVERY DAY    ROS:  Review of Systems  Constitutional: Negative.   HENT: Negative.   Eyes: Negative.   Respiratory: Negative.   Cardiovascular: Negative.   Gastrointestinal: Negative.   Genitourinary: Negative.   Musculoskeletal: Negative.   Skin: Negative.   Neurological: Negative.   Endo/Heme/Allergies: Negative.   Psychiatric/Behavioral: Negative.   All other systems reviewed and are negative.    Objective:   Today's Vitals: BP 140/70   Pulse 80   Resp 15   Ht 5\' 2"  (1.575 m)   Wt 130 lb (59 kg)   BMI 23.78 kg/m  Vitals with BMI 12/30/2019 09/14/2019 04/29/2019  Height 5'  2" 5\' 2"  5\' 2"   Weight 130 lbs 146 lbs 6 oz 146 lbs 1 oz  BMI 23.77 [id number]  Systolic [id number] AB-123456789 AB-123456789  Diastolic 70 80 80  Pulse 80 53 53     Physical Exam Vitals and nursing note reviewed.  Constitutional:      Appearance: Normal appearance. She is well-developed, well-groomed and normal weight.  HENT:     Head: Normocephalic and atraumatic.     Right Ear: External ear normal.     Left Ear: External ear normal.     Mouth/Throat:     Comments: Mask in place Eyes:     General:        Right eye: No discharge.        Left eye: No discharge.     Conjunctiva/sclera: Conjunctivae normal.     Comments: Glasses  Cardiovascular:      Rate and Rhythm: Normal rate and regular rhythm.     Pulses: Normal pulses.     Heart sounds: Normal heart sounds.  Pulmonary:     Effort: Pulmonary effort is normal.     Breath sounds: Normal breath sounds.  Musculoskeletal:        General: Normal range of motion.     Cervical back: Normal range of motion and neck supple.     Comments: Front wheel walker  Skin:    General: Skin is warm.     Findings: Rash present.     Comments: Yeast under bilateral breast folds.  Neurological:     General: No focal deficit present.     Mental Status: She is alert and oriented to person, place, and time.  Psychiatric:        Attention and Perception: Attention normal.        Mood and Affect: Mood normal.        Speech: Speech normal.        Behavior: Behavior normal. Behavior is cooperative.        Thought Content: Thought content normal.        Cognition and Memory: Cognition normal.        Judgment: Judgment normal.      Assessment   1. Vascular dementia without behavioral disturbance ([place])   2. At high risk for falls   3. Essential hypertension   4. Hyperlipidemia LDL goal <130   5. Skin yeast infection     Tests ordered No orders of the defined types were placed in this encounter.    Plan: Please see assessment and plan per problem list above.   No orders of the defined types were placed in this encounter.   Patient to follow-up in 04/26/2020 .  Perlie Mayo, NP

## 2019-12-30 NOTE — Patient Instructions (Signed)
I appreciate the opportunity to provide you with care for your health and wellness. Today we discussed: overall health   Follow up: 04/26/2020 as scheduled   No labs or referrals today  Please continue to practice social distancing to keep you, your family, and our community safe.  If you must go out, please wear a mask and practice good handwashing.  It was a pleasure to see you and I look forward to continuing to work together on your health and well-being. Please do not hesitate to call the office if you need care or have questions about your care.  Have a wonderful day and week. With Gratitude, Cherly Beach, DNP, AGNP-BC

## 2019-12-30 NOTE — Assessment & Plan Note (Signed)
High fall risk uses walker regularly.  They deny having any recent falls.  Does live with sister.  Reports overall that she is able to ambulate without any issues or concerns.  Reviewed home health safety with caregiver-niece.

## 2019-12-30 NOTE — Assessment & Plan Note (Signed)
Bilateral under breast yeast infections.  Will prescribe nystatin cream.  Reviewed recommendations to help prevent moisture from building up in this area.

## 2019-12-31 MED ORDER — NYSTATIN 100000 UNIT/GM EX POWD: 1.0000 "application " | Freq: Three times a day (TID) | CUTANEOUS | 0 refills | Status: DC

## 2019-12-31 NOTE — Addendum Note (Signed)
Addended by: Perlie Mayo on: 12/31/2019 09:47 AM   Modules accepted: Orders

## 2020-03-28 ENCOUNTER — Other Ambulatory Visit: Payer: Self-pay | Admitting: Family Medicine

## 2020-04-04 ENCOUNTER — Telehealth: Payer: Self-pay

## 2020-04-04 NOTE — Telephone Encounter (Signed)
Needs and updated referral sent for homehealth for [facility]. Previous referral has expired

## 2020-04-11 NOTE — Telephone Encounter (Signed)
Did you and Shana complete paperwork for this patient at her visit in May?

## 2020-04-12 NOTE — Telephone Encounter (Signed)
Spoke with caregiver. She will come in for her appt so we can send a referral to [facility].

## 2020-04-12 NOTE — Telephone Encounter (Signed)
It was not mentioned in note to need this updated, she can get it most likely when she sees Dr on 9/7.  Thanks

## 2020-04-26 ENCOUNTER — Ambulatory Visit: Payer: Medicare Other | Admitting: Family Medicine

## 2020-05-27 DIAGNOSIS — B351 Tinea unguium: Secondary | ICD-10-CM | POA: Diagnosis not present

## 2020-05-27 DIAGNOSIS — I739 Peripheral vascular disease, unspecified: Secondary | ICD-10-CM | POA: Diagnosis not present

## 2020-06-08 ENCOUNTER — Encounter: Payer: Self-pay | Admitting: Family Medicine

## 2020-06-08 ENCOUNTER — Ambulatory Visit (INDEPENDENT_AMBULATORY_CARE_PROVIDER_SITE_OTHER): Payer: Medicare Other | Admitting: Family Medicine

## 2020-06-08 ENCOUNTER — Other Ambulatory Visit: Payer: Self-pay

## 2020-06-08 VITALS — BP 132/76 | HR 85 | Resp 16 | Ht 62.0 in | Wt 146.1 lb

## 2020-06-08 DIAGNOSIS — F0280 Dementia in other diseases classified elsewhere without behavioral disturbance: Secondary | ICD-10-CM

## 2020-06-08 DIAGNOSIS — I10 Essential (primary) hypertension: Secondary | ICD-10-CM | POA: Diagnosis not present

## 2020-06-08 DIAGNOSIS — Z23 Encounter for immunization: Secondary | ICD-10-CM

## 2020-06-08 DIAGNOSIS — E785 Hyperlipidemia, unspecified: Secondary | ICD-10-CM | POA: Diagnosis not present

## 2020-06-08 DIAGNOSIS — G301 Alzheimer's disease with late onset: Secondary | ICD-10-CM | POA: Diagnosis not present

## 2020-06-08 DIAGNOSIS — E559 Vitamin D deficiency, unspecified: Secondary | ICD-10-CM | POA: Diagnosis not present

## 2020-06-08 DIAGNOSIS — Z9181 History of falling: Secondary | ICD-10-CM

## 2020-06-08 DIAGNOSIS — G8929 Other chronic pain: Secondary | ICD-10-CM

## 2020-06-08 DIAGNOSIS — M25511 Pain in right shoulder: Secondary | ICD-10-CM | POA: Diagnosis not present

## 2020-06-08 NOTE — Assessment & Plan Note (Signed)
Persistent and unchanged, able to toilet independently

## 2020-06-08 NOTE — Assessment & Plan Note (Signed)
Severe maintained on aricept, no behavioral disturbance noted

## 2020-06-08 NOTE — Assessment & Plan Note (Signed)
Home safety and fall risk reduction reviewed °

## 2020-06-08 NOTE — Assessment & Plan Note (Signed)
Controlled, no change in medication  

## 2020-06-08 NOTE — Patient Instructions (Addendum)
F/u in 6 months, in office with MD, call if you need me sooner  Flu vaccine today  Because of dementia,you DO qualify for CAP asistance, [paperwork will be done   Because of urinary incontinence, we wioll send for incontinence supplies for you ( nurse please verify if she needs chux as well as depends for request)  Please be careful not to fall  Fasting cBC, lipid, cmp and eGFR and Vit D mid January 

## 2020-06-08 NOTE — Assessment & Plan Note (Signed)
Hyperlipidemia:Low fat diet discussed and encouraged.   Lipid Panel  Lab Results  Component Value Date   CHOL 206 (H) 09/04/2019   HDL 64 09/04/2019   LDLCALC 117 (H) 09/04/2019   TRIG 134 09/04/2019   CHOLHDL 3.2 09/04/2019   Updated lab needed at/ before next visit.

## 2020-06-08 NOTE — Progress Notes (Signed)
   SONDI DESCH     MRN: [id number]      DOB: December 12, 1917   HPI Ms. Kingdon is here for follow up and re-evaluation of chronic medical conditions, medication management and review of any available recent lab and radiology data. Main reason for visit is a face to face evaliuation to justify her need for in home help. She does have dementia as well as severe arthritis which limits safe mobility in her home as well as her ability to independently carry out ADL's   Preventive health is updated, specifically   Immunization.   [place] once for the first time 2 weeks ago, found on floor in bathroom, no obvious injury. Uses a walker 24/7. Appetite has improved No behavioral issues noted though she has severe denmentia Profound memory loss, disoriented x 3 Improved appetite with weight gain  Needs paperwork for home health, currently qualifies for 60 hrs/week over a 7 day period   ROS History from niece as pt incapable due to cognitive impairment Denies recent fever or chills. Denies sinus pressure, nasal congestion, ear pain or sore throat. Denies chest congestion, productive cough or wheezing. Denies no orthopnea leg swelling Denies , nausea, vomiting,diarrhea does have  Hard stool every day.   Denies malodorous urine does have urinary  Incontinence has accidents sometimes without heading to bathroom and at other times en route, mobility issues C/o  joint pain,  and limitation in mobility.requires walker for safe ambulation and has frozen right shoulder Denies headaches, seizures,. Does have Adequate sleep Denies skin break down or rash.   PE  BP 132/76   Pulse 85   Resp 16   Ht 5\' 2"  (1.575 m)   Wt 146 lb 1.3 oz (66.3 kg)   BMI 26.72 kg/m   Patient alert disoriented and in no ardiopulmonary distress.  HEENT: No facial asymmetry, EOMI,     Neck decreased ROM, though adequate.  Chest: Clear to auscultation bilaterally.  CVS: S1, S2 no murmurs, no S3.Regular rate.  ABD: Soft  non tender.   Ext: No edema  MS: decreased  ROM spine, shoulders, hips and knees.  Skin: Intact, no ulcerations or rash noted.  Psych: Good eye contact, . Memory impaired not anxious or depressed appearing.  CNS: CN 2-12 intact, power,  normal throughout.no focal deficits noted.   Assessment & Plan  Essential hypertension Controlled, no change in medication   Dementia Severe maintained on aricept, no behavioral disturbance noted  Shoulder pain, right Persistent and unchanged, able to toilet independently  Hyperlipidemia LDL goal <130 Hyperlipidemia:Low fat diet discussed and encouraged.   Lipid Panel  Lab Results  Component Value Date   CHOL 206 (H) 09/04/2019   HDL 64 09/04/2019   LDLCALC 117 (H) 09/04/2019   TRIG 134 09/04/2019   CHOLHDL 3.2 09/04/2019   Updated lab needed at/ before next visit.     At high risk for falls Home safety and fall risk reduction  reviewed

## 2020-07-04 ENCOUNTER — Telehealth: Payer: Self-pay | Admitting: Family Medicine

## 2020-07-04 NOTE — Telephone Encounter (Signed)
In your area for cAP, please print and enclose med list and dx liat ,ALSO NEEDS PRACTICE STAMP, Cash

## 2020-07-04 NOTE — Telephone Encounter (Signed)
Done and given to Joellen Jersey to process and fax and call Melanee Left to come collect original

## 2020-08-02 DIAGNOSIS — I739 Peripheral vascular disease, unspecified: Secondary | ICD-10-CM | POA: Diagnosis not present

## 2020-08-02 DIAGNOSIS — B351 Tinea unguium: Secondary | ICD-10-CM | POA: Diagnosis not present

## 2020-09-28 ENCOUNTER — Other Ambulatory Visit: Payer: Self-pay | Admitting: Family Medicine

## 2020-10-11 DIAGNOSIS — I739 Peripheral vascular disease, unspecified: Secondary | ICD-10-CM | POA: Diagnosis not present

## 2020-10-11 DIAGNOSIS — B351 Tinea unguium: Secondary | ICD-10-CM | POA: Diagnosis not present

## 2020-11-01 ENCOUNTER — Other Ambulatory Visit: Payer: Self-pay | Admitting: Family Medicine

## 2020-11-01 DIAGNOSIS — B372 Candidiasis of skin and nail: Secondary | ICD-10-CM

## 2020-12-10 ENCOUNTER — Other Ambulatory Visit: Payer: Self-pay | Admitting: Family Medicine

## 2021-01-17 DIAGNOSIS — B351 Tinea unguium: Secondary | ICD-10-CM | POA: Diagnosis not present

## 2021-01-17 DIAGNOSIS — I739 Peripheral vascular disease, unspecified: Secondary | ICD-10-CM | POA: Diagnosis not present

## 2021-02-09 DIAGNOSIS — R404 Transient alteration of awareness: Secondary | ICD-10-CM | POA: Diagnosis not present

## 2021-02-09 DIAGNOSIS — R509 Fever, unspecified: Secondary | ICD-10-CM | POA: Diagnosis not present

## 2021-02-10 ENCOUNTER — Telehealth: Payer: Self-pay

## 2021-02-10 NOTE — Telephone Encounter (Signed)
FYI: Pt niece Melanee Left called and states pt has a fever of 101-102 and she did call paramedics and they came and checked her vitals and they were good. She is alert and oriented and only experiencing fever at this time. Niece is Covid + advised she continue to administer tylenol and encourage hydration and food. Paramedics seen no need to take her to the ER at this time. Also advised if pt worsens or becomes lethargic to go to ER for eval.

## 2021-02-11 ENCOUNTER — Inpatient Hospital Stay (HOSPITAL_COMMUNITY)
Admission: EM | Admit: 2021-02-11 | Discharge: 2021-02-15 | DRG: 177 | Disposition: A | Discharge status: Skilled Nursing Facility | Payer: Medicare Other | Attending: Family Medicine | Admitting: Family Medicine

## 2021-02-11 ENCOUNTER — Other Ambulatory Visit: Payer: Self-pay

## 2021-02-11 ENCOUNTER — Encounter (HOSPITAL_COMMUNITY): Payer: Self-pay | Admitting: *Deleted

## 2021-02-11 ENCOUNTER — Emergency Department (HOSPITAL_COMMUNITY): Payer: Medicare Other

## 2021-02-11 DIAGNOSIS — R279 Unspecified lack of coordination: Secondary | ICD-10-CM | POA: Diagnosis not present

## 2021-02-11 DIAGNOSIS — R404 Transient alteration of awareness: Secondary | ICD-10-CM | POA: Diagnosis not present

## 2021-02-11 DIAGNOSIS — R41841 Cognitive communication deficit: Secondary | ICD-10-CM | POA: Diagnosis not present

## 2021-02-11 DIAGNOSIS — J1282 Pneumonia due to coronavirus disease 2019: Secondary | ICD-10-CM | POA: Diagnosis present

## 2021-02-11 DIAGNOSIS — R9431 Abnormal electrocardiogram [ECG] [EKG]: Secondary | ICD-10-CM | POA: Diagnosis not present

## 2021-02-11 DIAGNOSIS — F0390 Unspecified dementia without behavioral disturbance: Secondary | ICD-10-CM | POA: Diagnosis present

## 2021-02-11 DIAGNOSIS — R059 Cough, unspecified: Secondary | ICD-10-CM | POA: Diagnosis not present

## 2021-02-11 DIAGNOSIS — L89152 Pressure ulcer of sacral region, stage 2: Secondary | ICD-10-CM | POA: Diagnosis present

## 2021-02-11 DIAGNOSIS — L8990 Pressure ulcer of unspecified site, unspecified stage: Secondary | ICD-10-CM | POA: Insufficient documentation

## 2021-02-11 DIAGNOSIS — M6281 Muscle weakness (generalized): Secondary | ICD-10-CM | POA: Diagnosis not present

## 2021-02-11 DIAGNOSIS — M1990 Unspecified osteoarthritis, unspecified site: Secondary | ICD-10-CM | POA: Diagnosis present

## 2021-02-11 DIAGNOSIS — I10 Essential (primary) hypertension: Secondary | ICD-10-CM | POA: Diagnosis present

## 2021-02-11 DIAGNOSIS — R1312 Dysphagia, oropharyngeal phase: Secondary | ICD-10-CM | POA: Diagnosis not present

## 2021-02-11 DIAGNOSIS — E785 Hyperlipidemia, unspecified: Secondary | ICD-10-CM | POA: Diagnosis present

## 2021-02-11 DIAGNOSIS — Z7982 Long term (current) use of aspirin: Secondary | ICD-10-CM

## 2021-02-11 DIAGNOSIS — J069 Acute upper respiratory infection, unspecified: Secondary | ICD-10-CM | POA: Diagnosis not present

## 2021-02-11 DIAGNOSIS — R2689 Other abnormalities of gait and mobility: Secondary | ICD-10-CM | POA: Diagnosis not present

## 2021-02-11 DIAGNOSIS — G301 Alzheimer's disease with late onset: Secondary | ICD-10-CM | POA: Diagnosis not present

## 2021-02-11 DIAGNOSIS — R0902 Hypoxemia: Secondary | ICD-10-CM | POA: Diagnosis not present

## 2021-02-11 DIAGNOSIS — Z66 Do not resuscitate: Secondary | ICD-10-CM | POA: Diagnosis present

## 2021-02-11 DIAGNOSIS — R2681 Unsteadiness on feet: Secondary | ICD-10-CM | POA: Diagnosis not present

## 2021-02-11 DIAGNOSIS — Z79899 Other long term (current) drug therapy: Secondary | ICD-10-CM

## 2021-02-11 DIAGNOSIS — U071 COVID-19: Secondary | ICD-10-CM | POA: Diagnosis not present

## 2021-02-11 DIAGNOSIS — Z7401 Bed confinement status: Secondary | ICD-10-CM | POA: Diagnosis not present

## 2021-02-11 DIAGNOSIS — Z8249 Family history of ischemic heart disease and other diseases of the circulatory system: Secondary | ICD-10-CM | POA: Diagnosis not present

## 2021-02-11 DIAGNOSIS — F0280 Dementia in other diseases classified elsewhere without behavioral disturbance: Secondary | ICD-10-CM | POA: Diagnosis not present

## 2021-02-11 DIAGNOSIS — M6259 Muscle wasting and atrophy, not elsewhere classified, multiple sites: Secondary | ICD-10-CM | POA: Diagnosis not present

## 2021-02-11 LAB — CBC WITH DIFFERENTIAL/PLATELET
Abs Immature Granulocytes: 0.02 10*3/uL (ref 0.00–0.07)
Basophils Absolute: 0 10*3/uL (ref 0.0–0.1)
Basophils Relative: 0 %
Eosinophils Absolute: 0 10*3/uL (ref 0.0–0.5)
Eosinophils Relative: 1 %
HCT: 36.8 % (ref 36.0–46.0)
Hemoglobin: 11.5 g/dL — ABNORMAL LOW (ref 12.0–15.0)
Immature Granulocytes: 0 %
Lymphocytes Relative: 35 %
Lymphs Abs: 2.1 10*3/uL (ref 0.7–4.0)
MCH: 31.5 pg (ref 26.0–34.0)
MCHC: 31.3 g/dL (ref 30.0–36.0)
MCV: 100.8 fL — ABNORMAL HIGH (ref 80.0–100.0)
Monocytes Absolute: 0.7 10*3/uL (ref 0.1–1.0)
Monocytes Relative: 11 %
Neutro Abs: 3.2 10*3/uL (ref 1.7–7.7)
Neutrophils Relative %: 53 %
Platelets: ADEQUATE 10*3/uL (ref 150–400)
RBC: 3.65 MIL/uL — ABNORMAL LOW (ref 3.87–5.11)
RDW: 14.6 % (ref 11.5–15.5)
WBC: 6.1 10*3/uL (ref 4.0–10.5)
nRBC: 0 % (ref 0.0–0.2)

## 2021-02-11 LAB — TRIGLYCERIDES: Triglycerides: 72 mg/dL (ref <150)

## 2021-02-11 LAB — COMPREHENSIVE METABOLIC PANEL
ALT: 18 U/L (ref 0–44)
AST: 28 U/L (ref 15–41)
Albumin: 3.4 g/dL — ABNORMAL LOW (ref 3.5–5.0)
Alkaline Phosphatase: 54 U/L (ref 38–126)
Anion gap: 7 (ref 5–15)
BUN: 11 mg/dL (ref 8–23)
CO2: 26 mmol/L (ref 22–32)
Calcium: 8.4 mg/dL — ABNORMAL LOW (ref 8.9–10.3)
Chloride: 106 mmol/L (ref 98–111)
Creatinine, Ser: 0.83 mg/dL (ref 0.44–1.00)
GFR, Estimated: >60 mL/min (ref >60)
Glucose, Bld: 79 mg/dL (ref 70–99)
Potassium: 3.6 mmol/L (ref 3.5–5.1)
Sodium: 139 mmol/L (ref 135–145)
Total Bilirubin: 0.6 mg/dL (ref 0.3–1.2)
Total Protein: 6.3 g/dL — ABNORMAL LOW (ref 6.5–8.1)

## 2021-02-11 LAB — LACTIC ACID, PLASMA
Lactic Acid, Venous: 0.8 mmol/L (ref 0.5–1.9)
Lactic Acid, Venous: 0.7 mmol/L (ref 0.5–1.9)

## 2021-02-11 LAB — CBG MONITORING, ED: Glucose-Capillary: 74 mg/dL (ref 70–99)

## 2021-02-11 LAB — FIBRINOGEN: Fibrinogen: 395 mg/dL (ref 210–475)

## 2021-02-11 LAB — RESP PANEL BY RT-PCR (FLU A&B, COVID) ARPGX2
Influenza A by PCR: NEGATIVE
Influenza B by PCR: NEGATIVE
SARS Coronavirus 2 by RT PCR: POSITIVE — AB

## 2021-02-11 LAB — PROCALCITONIN: Procalcitonin: <0.1 ng/mL

## 2021-02-11 LAB — D-DIMER, QUANTITATIVE: D-Dimer, Quant: 1.07 ug/mL-FEU — ABNORMAL HIGH (ref 0.00–0.50)

## 2021-02-11 LAB — FERRITIN: Ferritin: 73 ng/mL (ref 11–307)

## 2021-02-11 LAB — LACTATE DEHYDROGENASE: LDH: 188 U/L (ref 98–192)

## 2021-02-11 LAB — C-REACTIVE PROTEIN: CRP: 5.7 mg/dL — ABNORMAL HIGH (ref <1.0)

## 2021-02-11 MED ORDER — HYDROCOD POLST-CPM POLST ER 10-8 MG/5ML PO SUER: 5.0000 mL | Freq: Two times a day (BID) | ORAL | Status: DC | PRN

## 2021-02-11 MED ORDER — ATORVASTATIN CALCIUM 10 MG PO TABS
10.0000 mg | ORAL_TABLET | Freq: Every day | ORAL | Status: DC
  Administered 2021-02-12 – 2021-02-15 (×4): 10 mg via ORAL
  Filled 2021-02-11 (×4): qty 1

## 2021-02-11 MED ORDER — SODIUM CHLORIDE 0.9% FLUSH: 3.0000 mL | INTRAVENOUS | Status: DC | PRN

## 2021-02-11 MED ORDER — ZINC SULFATE 220 (50 ZN) MG PO CAPS
220.0000 mg | ORAL_CAPSULE | Freq: Every day | ORAL | Status: DC
  Administered 2021-02-11 – 2021-02-15 (×5): 220 mg via ORAL
  Filled 2021-02-11 (×5): qty 1

## 2021-02-11 MED ORDER — AMLODIPINE BESYLATE 5 MG PO TABS
2.5000 mg | ORAL_TABLET | Freq: Every day | ORAL | Status: DC
  Administered 2021-02-12 – 2021-02-15 (×4): 2.5 mg via ORAL
  Filled 2021-02-11 (×4): qty 1

## 2021-02-11 MED ORDER — SODIUM CHLORIDE 0.9 % IV SOLN: 250.0000 mL | INTRAVENOUS | Status: DC | PRN

## 2021-02-11 MED ORDER — ENOXAPARIN SODIUM 40 MG/0.4ML IJ SOSY
40.0000 mg | PREFILLED_SYRINGE | INTRAMUSCULAR | Status: DC
  Administered 2021-02-12 – 2021-02-15 (×4): 40 mg via SUBCUTANEOUS
  Filled 2021-02-11 (×4): qty 0.4

## 2021-02-11 MED ORDER — GUAIFENESIN-DM 100-10 MG/5ML PO SYRP: 10.0000 mL | ORAL_SOLUTION | ORAL | Status: DC | PRN

## 2021-02-11 MED ORDER — SODIUM CHLORIDE 0.9% FLUSH
3.0000 mL | Freq: Two times a day (BID) | INTRAVENOUS | Status: DC
  Administered 2021-02-12 – 2021-02-15 (×6): 3 mL via INTRAVENOUS

## 2021-02-11 MED ORDER — SODIUM CHLORIDE 0.9% FLUSH: 3.0000 mL | Freq: Two times a day (BID) | INTRAVENOUS | Status: DC

## 2021-02-11 MED ORDER — ONDANSETRON HCL 4 MG/2ML IJ SOLN: 4.0000 mg | Freq: Four times a day (QID) | INTRAMUSCULAR | Status: DC | PRN

## 2021-02-11 MED ORDER — INSULIN ASPART 100 UNIT/ML IJ SOLN
0.0000 [IU] | Freq: Three times a day (TID) | INTRAMUSCULAR | Status: DC
  Administered 2021-02-15: 1 [IU] via SUBCUTANEOUS

## 2021-02-11 MED ORDER — SODIUM CHLORIDE 0.9% FLUSH
3.0000 mL | Freq: Two times a day (BID) | INTRAVENOUS | Status: DC
  Administered 2021-02-13 – 2021-02-15 (×2): 3 mL via INTRAVENOUS

## 2021-02-11 MED ORDER — ADULT MULTIVITAMIN W/MINERALS CH
1.0000 | ORAL_TABLET | Freq: Every day | ORAL | Status: DC
  Administered 2021-02-11 – 2021-02-15 (×5): 1 via ORAL
  Filled 2021-02-11 (×5): qty 1

## 2021-02-11 MED ORDER — SODIUM CHLORIDE 0.9 % IV SOLN: 100.0000 mg | Freq: Every day | INTRAVENOUS | Status: DC

## 2021-02-11 MED ORDER — ONDANSETRON HCL 4 MG PO TABS: 4.0000 mg | ORAL_TABLET | Freq: Four times a day (QID) | ORAL | Status: DC | PRN

## 2021-02-11 MED ORDER — ACETAMINOPHEN 325 MG PO TABS: 650.0000 mg | ORAL_TABLET | Freq: Four times a day (QID) | ORAL | Status: DC | PRN

## 2021-02-11 MED ORDER — CALCIUM CARBONATE-VITAMIN D 500-200 MG-UNIT PO TABS
1.0000 | ORAL_TABLET | Freq: Every day | ORAL | Status: DC
  Administered 2021-02-12 – 2021-02-15 (×4): 1 via ORAL
  Filled 2021-02-11 (×4): qty 1

## 2021-02-11 MED ORDER — DONEPEZIL HCL 5 MG PO TABS
10.0000 mg | ORAL_TABLET | Freq: Every day | ORAL | Status: DC
  Administered 2021-02-12 – 2021-02-14 (×3): 10 mg via ORAL
  Filled 2021-02-11 (×3): qty 2

## 2021-02-11 MED ORDER — ALBUTEROL SULFATE HFA 108 (90 BASE) MCG/ACT IN AERS
2.0000 | INHALATION_SPRAY | Freq: Four times a day (QID) | RESPIRATORY_TRACT | Status: DC
  Administered 2021-02-12 (×2): 2 via RESPIRATORY_TRACT
  Filled 2021-02-11: qty 6.7

## 2021-02-11 MED ORDER — LABETALOL HCL 5 MG/ML IV SOLN: 10.0000 mg | INTRAVENOUS | Status: DC | PRN

## 2021-02-11 MED ORDER — ACETAMINOPHEN 650 MG RE SUPP: 650.0000 mg | Freq: Four times a day (QID) | RECTAL | Status: DC | PRN

## 2021-02-11 MED ORDER — ADULT MULTIVITAMIN W/MINERALS CH: 1.0000 | ORAL_TABLET | Freq: Every day | ORAL | Status: DC

## 2021-02-11 MED ORDER — ASCORBIC ACID 500 MG PO TABS
500.0000 mg | ORAL_TABLET | Freq: Every day | ORAL | Status: DC
  Administered 2021-02-11 – 2021-02-15 (×5): 500 mg via ORAL
  Filled 2021-02-11 (×5): qty 1

## 2021-02-11 MED ORDER — SODIUM CHLORIDE 0.9 % IV SOLN
100.0000 mg | Freq: Every day | INTRAVENOUS | Status: AC
  Administered 2021-02-12 – 2021-02-15 (×4): 100 mg via INTRAVENOUS
  Filled 2021-02-11 (×2): qty 100
  Filled 2021-02-11 (×2): qty 20

## 2021-02-11 MED ORDER — ASPIRIN EC 81 MG PO TBEC
81.0000 mg | DELAYED_RELEASE_TABLET | Freq: Every day | ORAL | Status: DC
  Administered 2021-02-12 – 2021-02-15 (×4): 81 mg via ORAL
  Filled 2021-02-11 (×4): qty 1

## 2021-02-11 MED ORDER — SODIUM CHLORIDE 0.9 % IV SOLN
100.0000 mg | INTRAVENOUS | Status: AC
  Administered 2021-02-11 (×2): 100 mg via INTRAVENOUS
  Filled 2021-02-11 (×2): qty 20

## 2021-02-11 MED ORDER — SODIUM CHLORIDE 0.9 % IV SOLN: 200.0000 mg | Freq: Once | INTRAVENOUS | Status: DC

## 2021-02-11 MED ORDER — TRAZODONE HCL 50 MG PO TABS
50.0000 mg | ORAL_TABLET | Freq: Every evening | ORAL | Status: DC | PRN
  Administered 2021-02-12: 50 mg via ORAL
  Filled 2021-02-11: qty 1

## 2021-02-11 MED ORDER — CALCIUM CARB-CHOLECALCIFEROL 600-800 MG-UNIT PO TABS: 1.0000 | ORAL_TABLET | Freq: Every day | ORAL | Status: DC

## 2021-02-11 MED ORDER — BISACODYL 10 MG RE SUPP: 10.0000 mg | Freq: Every day | RECTAL | Status: DC | PRN

## 2021-02-11 MED ORDER — POLYETHYLENE GLYCOL 3350 17 G PO PACK: 17.0000 g | PACK | Freq: Every day | ORAL | Status: DC | PRN

## 2021-02-11 MED ORDER — SODIUM CHLORIDE 0.9 % IV BOLUS
500.0000 mL | Freq: Once | INTRAVENOUS | Status: AC
  Administered 2021-02-11: 500 mL via INTRAVENOUS

## 2021-02-11 MED ORDER — DEXAMETHASONE 4 MG PO TABS
6.0000 mg | ORAL_TABLET | ORAL | Status: DC
  Administered 2021-02-11 – 2021-02-14 (×4): 6 mg via ORAL
  Filled 2021-02-11 (×4): qty 2

## 2021-02-11 MED ORDER — INSULIN ASPART 100 UNIT/ML IJ SOLN: 0.0000 [IU] | Freq: Every day | INTRAMUSCULAR | Status: DC

## 2021-02-11 NOTE — ED Notes (Signed)
Pt pulled out purewick and IV prior to transporting upstairs. Zack, RN on 3rd floor made aware.

## 2021-02-11 NOTE — ED Notes (Signed)
Date and time results received: 02/11/21 @ 2052 (use smartphrase ".now" to insert current time)  Test:Covid Critical Value: Positive Name of Provider Notified: Dr Kathrynn Humble Orders Received? None Or Actions Taken?:

## 2021-02-11 NOTE — ED Triage Notes (Addendum)
Pt lives with niece at home and niece wants pt evaluated due to cough and chest congestion.  Reported that pt has tested positive for Covid about a week ago?  Reported that pt sat on a potty chair most of the day.  reported with hx of dementia.  185/85 per transport. O2 sats 96%

## 2021-02-11 NOTE — H&P (Signed)
Patient Demographics:    Katrina Berry, is a 87 y.o. female  MRN: [id number]   DOB - 26-Sep-1917  Admit Date - 02/11/2021  Outpatient Primary MD for the patient is Fayrene Helper, MD   Assessment & Plan:    Principal Problem:   Pneumonia due to COVID-19 virus Active Problems:   Acute respiratory disease due to COVID-19 virus   Essential hypertension   Dementia ([place])   Hyperlipidemia LDL goal <130   1)Covid PNA w/o Hypoxia--- patient was vaccinated, but not boosted patient is 87 years old, primary caregiver is a niece who is over 72 years old and currently COVID-positive and no longer able to care for patient at this time -Chest x-ray consistent with pneumonia --------patient meets criteria for initiation of Remdesivir AND Steroid therapy per protocol  --Check and trend inflammatory markers including D-dimer, ferritin and  CRP---also follow CBC and CMP --Supplemental oxygen to keep O2 sats above 93% -Follow serial chest x-rays and ABGs as indicated --- Encourage prone positioning for More than 16 hours/day in increments of 2 to 3 hours at a time if able to tolerate --Attempt to maintain euvolemic state --Zinc and vitamin C as ordered -Albuterol inhaler as needed -Accu-Cheks/fingersticks while on high-dose steroids COVID-19 Labs Recent Labs    02/11/21 1931  DDIMER 1.07*  FERRITIN 73  LDH 188  CRP 5.7*   Lab Results  Component Value Date   SARSCOV2NAA POSITIVE (A) 02/11/2021    2)Disposition/Social/Ethics--patient is 87 years old, primary caregiver is a niece who is over 87 years old and currently COVID-positive and no longer able to care for patient at this time -No safe discharge plan at this time as patient does not have any caregivers able to take of her -Await social worker  input  3)HTN--hold losartan given poor oral intake and risk for dehydration and AKI,  -Start amlodipine 2.5 mg daily -may use IV labetalol as needed elevated BP  4)Dementia--continue Aricept 10 mg nightly  5)HLD--continue Lipitor 10 mg daily along with aspirin 81 mg daily  Disposition/Need for in-Hospital Stay- patient unable to be discharged at this time due to --patient is 87 years old, primary caregiver is a niece who is over 90 years old and currently COVID-positive and no longer able to care for patient at this time -- Unsafe discharge plan at this time  Status is: Inpatient  Remains inpatient appropriate because: -- Unsafe discharge plan at this time  Dispo: The patient is from: Home              Anticipated d/c is to: Home              Anticipated d/c date is: 2 days              Patient currently is not medically stable to d/c. Barriers: Not Clinically Stable-   With History of - Reviewed by me  Past Medical History:  Diagnosis Date  Allergic rhinitis 11/25/2016   Arthritis    mild, no known falls uses cane   Dementia ([place])    Hypertension    Neurofibroma 05/29/2017   Right breast skin lesion- pathology neurofibroma       Past Surgical History:  Procedure Laterality Date   MASS EXCISION Right 06/07/2017   Procedure: EXCISION SKIN LESION OF RIGHT BREAST;  Surgeon: Virl Cagey, MD;  Location: [facility];  Service: General;  Laterality: Right;   NO PAST SURGERIES        Chief Complaint  Patient presents with   Cough      HPI:    Katrina Berry  is a 87 y.o. female with past medical history relevant for dementia, HLD, HTN who is vaccinated against COVID-19 infection but not boosted presents to the ED with fatigue, weakness, inability to perform ADLs, poor oral intake cough and shortness of breath -- Patient apparently has had persistent fevers and chills at home, myalgias malaise-- -patient is 87 years old, primary caregiver is a niece who is over 11  years old and currently COVID-positive and no longer able to care for patient at this time -- Unsafe discharge plan at this time  -Chest x-ray consistent with COVID-pneumonia COVID-19 Labs  Recent Labs    02/11/21 1931  DDIMER 1.07*  FERRITIN 73  LDH 188  CRP 5.7*   Lab Results  Component Value Date   SARSCOV2NAA POSITIVE (A) 02/11/2021   -Lactic acid is 0.8 -CBC with hemoglobin of 11.5, WBC 6.1  -Creatinine 0.83, potassium 3.6, sodium 139, bicarb 26 -Glucose is 79, LFTs are not elevated PCT < 0.10 -No vomiting or diarrhea    Review of systems:    In addition to the HPI above,   A full Review of  Systems was done, all other systems reviewed are negative except as noted above in HPI , .    Social History:  Reviewed by me    Social History   Tobacco Use   Smoking status: Never   Smokeless tobacco: Never  Substance Use Topics   Alcohol use: No     Family History :  Reviewed by me    Family History  Problem Relation Age of Onset   Diabetes Sister    Hypertension Sister     Home Medications:   Prior to Admission medications   Medication Sig Start Date End Date Taking? Authorizing Provider  acetaminophen (TYLENOL) 500 MG tablet Take 500 mg by mouth every 6 (six) hours as needed for mild pain or headache.     [provider]  aspirin EC 81 MG tablet Take 1 tablet (81 mg total) by mouth daily. 07/07/15   Fayrene Helper, MD  atorvastatin (LIPITOR) 10 MG tablet TAKE 1 TABLET BY MOUTH EVERY DAY 12/12/20   Fayrene Helper, MD  Calcium Carb-Cholecalciferol (CALCIUM 600+D3) 600-800 MG-UNIT TABS Take 1 tablet by mouth daily.    [provider]  donepezil (ARICEPT) 10 MG tablet TAKE 1 TABLET BY MOUTH EVERYDAY AT BEDTIME 09/28/20   Fayrene Helper, MD  losartan (COZAAR) 100 MG tablet TAKE 1 TABLET BY MOUTH EVERY DAY 09/28/20   Fayrene Helper, MD  Multiple Vitamin (MULTIVITAMIN WITH MINERALS) TABS tablet Take 1 tablet by mouth daily.     [provider]  nystatin (MYCOSTATIN/NYSTOP) powder APPLY 1 APPLICATION TOPICALLY 3 (THREE) TIMES DAILY. 11/01/20   Noreene Larsson, NP     Allergies:    No Known Allergies   Physical  Exam:   Vitals  Blood pressure (!) 161/61, pulse (!) 44, temperature 98.7 F (37.1 C), temperature source Oral, resp. rate (!) 21, SpO2 99 %.  Physical Examination: General appearance - alert, elderly  Mental status -underlying/baseline cognitive and memory deficits Eyes - sclera anicteric Neck - supple, no JVD elevation , Chest -diminished breath sounds with bibasilar rhonchi  heart - S1 and S2 normal, regular  Abdomen - soft, nontender, nondistended, no CVA tenderness  Neurological -generalized weakness without new acute focal deficit Extremities - no pedal edema noted, intact peripheral pulses  Skin - warm, dry     Data Review:    CBC Recent Labs  Lab 02/11/21 1931  WBC 6.1  HGB 11.5*  HCT 36.8  PLT PLATELET CLUMPS NOTED ON SMEAR, COUNT APPEARS ADEQUATE  MCV 100.8*  MCH 31.5  MCHC 31.3  RDW 14.6  LYMPHSABS 2.1  MONOABS 0.7  EOSABS 0.0  BASOSABS 0.0   ------------------------------------------------------------------------------------------------------------------  Chemistries  Recent Labs  Lab 02/11/21 1931  NA 139  K 3.6  CL 106  CO2 26  GLUCOSE 79  BUN 11  CREATININE 0.83  CALCIUM 8.4*  AST 28  ALT 18  ALKPHOS 54  BILITOT 0.6   ------------------------------------------------------------------------------------------------------------------ CrCl cannot be calculated (Unknown ideal weight.). ------------------------------------------------------------------------------------------------------------------ No results for input(s): TSH, T4TOTAL, T3FREE, THYROIDAB in the last 72 hours.  Invalid input(s): FREET3   Coagulation profile No results for input(s): INR, PROTIME in the last 168  hours. ------------------------------------------------------------------------------------------------------------------- Recent Labs    02/11/21 1931  DDIMER 1.07*   -------------------------------------------------------------------------------------------------------------------  Cardiac Enzymes No results for input(s): CKMB, TROPONINI, MYOGLOBIN in the last 168 hours.  Invalid input(s): CK ------------------------------------------------------------------------------------------------------------------ No results found for: BNP   ---------------------------------------------------------------------------------------------------------------  Urinalysis    Component Value Date/Time   COLORURINE YELLOW 11/16/2016 [place] 11/16/2016 1738   LABSPEC 1.015 11/16/2016 1738   PHURINE 5.0 11/16/2016 1738   GLUCOSEU NEGATIVE 11/16/2016 1738   HGBUR SMALL (A) 11/16/2016 1738   BILIRUBINUR NEGATIVE 11/16/2016 1738   KETONESUR NEGATIVE 11/16/2016 1738   PROTEINUR NEGATIVE 11/16/2016 1738   NITRITE NEGATIVE 11/16/2016 1738   LEUKOCYTESUR NEGATIVE 11/16/2016 1738    ----------------------------------------------------------------------------------------------------------------   Imaging Results:    DG Chest Portable 1 View  Result Date: 02/11/2021 CLINICAL DATA:  Cough, COVID positive. EXAM: PORTABLE CHEST 1 VIEW COMPARISON:  CT chest abdomen pelvis 11/16/2016 FINDINGS: The heart size and mediastinal contours are within normal limits. Bibasilar streaky airspace opacity. No pulmonary edema. No pleural effusion. No pneumothorax. No acute osseous abnormality. IMPRESSION: Bibasilar streaky airspace opacity that could represent a combination of atelectasis and infection/inflammation. Electronically Signed   By: Iven Finn M.D.   On: 02/11/2021 20:42    Radiological Exams on Admission: DG Chest Portable 1 View  Result Date: 02/11/2021 CLINICAL DATA:  Cough, COVID  positive. EXAM: PORTABLE CHEST 1 VIEW COMPARISON:  CT chest abdomen pelvis 11/16/2016 FINDINGS: The heart size and mediastinal contours are within normal limits. Bibasilar streaky airspace opacity. No pulmonary edema. No pleural effusion. No pneumothorax. No acute osseous abnormality. IMPRESSION: Bibasilar streaky airspace opacity that could represent a combination of atelectasis and infection/inflammation. Electronically Signed   By: Iven Finn M.D.   On: 02/11/2021 20:42    DVT Prophylaxis -SCD/lovenox AM Labs Ordered, also please review Full Orders  Family Communication: Admission, patients condition and plan of care including tests being ordered have been discussed with the patient  who indicate understanding and agree with the plan   Code Status -  Full Code  Likely DC to  home once a safe discharge plan can be arranged  Condition   stable Roxan Hockey M.D on 02/11/2021 at 9:12 PM Go to [email or web address] -  for contact info  [facility] - Office  [phone]

## 2021-02-12 DIAGNOSIS — L89152 Pressure ulcer of sacral region, stage 2: Secondary | ICD-10-CM

## 2021-02-12 DIAGNOSIS — E785 Hyperlipidemia, unspecified: Secondary | ICD-10-CM

## 2021-02-12 DIAGNOSIS — J069 Acute upper respiratory infection, unspecified: Secondary | ICD-10-CM

## 2021-02-12 DIAGNOSIS — F0280 Dementia in other diseases classified elsewhere without behavioral disturbance: Secondary | ICD-10-CM

## 2021-02-12 DIAGNOSIS — I10 Essential (primary) hypertension: Secondary | ICD-10-CM

## 2021-02-12 DIAGNOSIS — L8990 Pressure ulcer of unspecified site, unspecified stage: Secondary | ICD-10-CM | POA: Insufficient documentation

## 2021-02-12 DIAGNOSIS — G301 Alzheimer's disease with late onset: Secondary | ICD-10-CM

## 2021-02-12 LAB — CBC WITH DIFFERENTIAL/PLATELET
Abs Immature Granulocytes: 0.01 10*3/uL (ref 0.00–0.07)
Basophils Absolute: 0 10*3/uL (ref 0.0–0.1)
Basophils Relative: 0 %
Eosinophils Absolute: 0 10*3/uL (ref 0.0–0.5)
Eosinophils Relative: 0 %
HCT: 37 % (ref 36.0–46.0)
Hemoglobin: 11.6 g/dL — ABNORMAL LOW (ref 12.0–15.0)
Immature Granulocytes: 0 %
Lymphocytes Relative: 15 %
Lymphs Abs: 0.8 10*3/uL (ref 0.7–4.0)
MCH: 31.4 pg (ref 26.0–34.0)
MCHC: 31.4 g/dL (ref 30.0–36.0)
MCV: 100.3 fL — ABNORMAL HIGH (ref 80.0–100.0)
Monocytes Absolute: 0.1 10*3/uL (ref 0.1–1.0)
Monocytes Relative: 2 %
Neutro Abs: 4.1 10*3/uL (ref 1.7–7.7)
Neutrophils Relative %: 83 %
Platelets: 123 10*3/uL — ABNORMAL LOW (ref 150–400)
RBC: 3.69 MIL/uL — ABNORMAL LOW (ref 3.87–5.11)
RDW: 14.6 % (ref 11.5–15.5)
WBC: 4.9 10*3/uL (ref 4.0–10.5)
nRBC: 0 % (ref 0.0–0.2)

## 2021-02-12 LAB — GLUCOSE, CAPILLARY
Glucose-Capillary: 137 mg/dL — ABNORMAL HIGH (ref 70–99)
Glucose-Capillary: 131 mg/dL — ABNORMAL HIGH (ref 70–99)
Glucose-Capillary: 119 mg/dL — ABNORMAL HIGH (ref 70–99)
Glucose-Capillary: 150 mg/dL — ABNORMAL HIGH (ref 70–99)
Glucose-Capillary: 58 mg/dL — ABNORMAL LOW (ref 70–99)
Glucose-Capillary: 66 mg/dL — ABNORMAL LOW (ref 70–99)
Glucose-Capillary: 94 mg/dL (ref 70–99)

## 2021-02-12 LAB — MAGNESIUM: Magnesium: 1.8 mg/dL (ref 1.7–2.4)

## 2021-02-12 LAB — COMPREHENSIVE METABOLIC PANEL
ALT: 19 U/L (ref 0–44)
AST: 34 U/L (ref 15–41)
Albumin: 3.3 g/dL — ABNORMAL LOW (ref 3.5–5.0)
Alkaline Phosphatase: 56 U/L (ref 38–126)
Anion gap: 7 (ref 5–15)
BUN: 11 mg/dL (ref 8–23)
CO2: 26 mmol/L (ref 22–32)
Calcium: 8.6 mg/dL — ABNORMAL LOW (ref 8.9–10.3)
Chloride: 107 mmol/L (ref 98–111)
Creatinine, Ser: 0.83 mg/dL (ref 0.44–1.00)
GFR, Estimated: >60 mL/min (ref >60)
Glucose, Bld: 150 mg/dL — ABNORMAL HIGH (ref 70–99)
Potassium: 3.4 mmol/L — ABNORMAL LOW (ref 3.5–5.1)
Sodium: 140 mmol/L (ref 135–145)
Total Bilirubin: 0.5 mg/dL (ref 0.3–1.2)
Total Protein: 6.5 g/dL (ref 6.5–8.1)

## 2021-02-12 LAB — C-REACTIVE PROTEIN: CRP: 6.4 mg/dL — ABNORMAL HIGH (ref <1.0)

## 2021-02-12 LAB — D-DIMER, QUANTITATIVE: D-Dimer, Quant: 1.49 ug/mL-FEU — ABNORMAL HIGH (ref 0.00–0.50)

## 2021-02-12 LAB — PHOSPHORUS: Phosphorus: 2.5 mg/dL (ref 2.5–4.6)

## 2021-02-12 LAB — FERRITIN: Ferritin: 88 ng/mL (ref 11–307)

## 2021-02-12 MED ORDER — LORAZEPAM 2 MG/ML IJ SOLN
1.0000 mg | Freq: Once | INTRAMUSCULAR | Status: AC
  Administered 2021-02-12: 1 mg via INTRAVENOUS
  Filled 2021-02-12: qty 1

## 2021-02-12 MED ORDER — ALBUTEROL SULFATE HFA 108 (90 BASE) MCG/ACT IN AERS
2.0000 | INHALATION_SPRAY | Freq: Two times a day (BID) | RESPIRATORY_TRACT | Status: DC
  Administered 2021-02-12 – 2021-02-15 (×6): 2 via RESPIRATORY_TRACT

## 2021-02-12 MED ORDER — ENSURE ENLIVE PO LIQD
237.0000 mL | Freq: Two times a day (BID) | ORAL | Status: DC
  Administered 2021-02-12 – 2021-02-14 (×4): 237 mL via ORAL

## 2021-02-12 NOTE — Progress Notes (Signed)
PROGRESS NOTE    Katrina Berry  IRW:[id number] DOB: 06/10/1918 DOA: 02/11/2021 PCP: Fayrene Helper, MD   Chief Complaint  Patient presents with   Cough    Brief admission narrative:  As per H&P written by Dr. Denton Brick on 02/11/2021  Katrina Berry  is a 87 y.o. female with past medical history relevant for dementia, HLD, HTN who is vaccinated against COVID-19 infection but not boosted presents to the ED with fatigue, weakness, inability to perform ADLs, poor oral intake, cough and shortness of breath -- Patient apparently has had persistent fevers and chills at home, myalgias malaise-- -patient is 87 years old, primary caregiver is a niece who is over 14 years old and currently COVID-positive and is struggling with her health herself.   -Chest x-ray consistent with COVID-pneumonia COVID-19 Labs  Assessment & Plan: 1-pneumonia due to COVID-19 -Requiring 2 L nasal cannula supplementation and with appreciated intermittent coughing spells and bilateral rhonchi at on auscultation. -Continue to wean oxygen as tolerated -Continue vitamin C and zinc -Continue bronchodilators and antitussive medications. -Day 2 out of 5 of remdesivir and will continue steroids. -Follow inflammatory markers.  2-Essential hypertension -Overall stable -Continue as needed labetalol and amlodipine  3-history of dementia -Mentation at baseline -Constant reorientation will be provided -Patient high risk for developing hospital-acquired delirium -Continue nightly Aricept.  4-hyperlipidemia -Continue Lipitor.  5-Pressure injury of skin -Stage II sacral and pressure injury -Continue skin barriers and constant repositioning. -Patient's pressure injury was present at time of admission.  DVT prophylaxis: Lovenox Code Status: Full code Family Communication: Niece updated over the phone. Disposition:   Status is: Inpatient  Remains inpatient appropriate because:IV treatments appropriate due  to intensity of illness or inability to take PO  Dispo: The patient is from: Home              Anticipated d/c is to: To be determined              Patient currently is not medically stable to d/c.   Difficult to place patient No       Consultants:  None  Procedures:  See below for x-ray reports.  Antimicrobials/antiviral Remdesivir (day 2 out of 5)  Subjective: No fever, no chest pain, no nausea, no vomiting.  Still requiring 2 L nasal cannula supplementation.  Objective: Vitals:   02/12/21 0004 02/12/21 0154 02/12/21 0559 02/12/21 0806  BP: (!) 145/94  (!) 151/61   Pulse: (!) 53  61   Resp: 18  19   Temp: 99.3 F (37.4 C)  98.5 F (36.9 C)   TempSrc: Oral  Oral   SpO2: (!) 89% 96% 94% 95%  Weight: 66.3 kg     Height: 5\' 2"  (1.575 m)       Intake/Output Summary (Last 24 hours) at 02/12/2021 1736 Last data filed at 02/12/2021 1708 Gross per 24 hour  Intake 914.41 ml  Output 203 ml  Net 711.41 ml   Filed Weights   02/12/21 0004  Weight: 66.3 kg    Examination:  General exam: Appears calm and in no major distress; using 2 L nasal cannula supplementation and reporting no chest pain, nausea or vomiting.  Patient is afebrile currently. Respiratory system: Fair air movement bilaterally; no using accessory muscles.  Positive rhonchi. Cardiovascular system: S1 & S2 heard, no rubs, no gallops, no JVD. Gastrointestinal system: Abdomen is nondistended, soft and nontender. No organomegaly or masses felt. Normal bowel sounds heard. Central nervous system: Alert and oriented. No focal  neurological deficits. Extremities: No cyanosis or clubbing. Skin: No petechiae; stage II sacral pressure injury, without signs of superimposed infection.  Present on admission. Psychiatry: Mood & affect appropriate.     Data Reviewed: I have personally reviewed following labs and imaging studies  CBC: Recent Labs  Lab 02/11/21 1931 02/12/21 0633  WBC 6.1 4.9  NEUTROABS 3.2 4.1   HGB 11.5* 11.6*  HCT 36.8 37.0  MCV 100.8* 100.3*  PLT PLATELET CLUMPS NOTED ON SMEAR, COUNT APPEARS ADEQUATE 123*    Basic Metabolic Panel: Recent Labs  Lab 02/11/21 1931 02/12/21 0633  NA 139 140  K 3.6 3.4*  CL 106 107  CO2 26 26  GLUCOSE 79 150*  BUN 11 11  CREATININE 0.83 0.83  CALCIUM 8.4* 8.6*  MG  --  1.8  PHOS  --  2.5    GFR: Estimated Creatinine Clearance: 31.4 mL/min (by C-G formula based on SCr of 0.83 mg/dL).  Liver Function Tests: Recent Labs  Lab 02/11/21 1931 02/12/21 0633  AST 28 34  ALT 18 19  ALKPHOS 54 56  BILITOT 0.6 0.5  PROT 6.3* 6.5  ALBUMIN 3.4* 3.3*   CBG: Recent Labs  Lab 02/11/21 2236 02/12/21 0117 02/12/21 0602 02/12/21 0732 02/12/21 1124  GLUCAP 74 137* 131* 119* 150*    Recent Results (from the past 240 hour(s))  Resp Panel by RT-PCR (Flu A&B, Covid) Nasopharyngeal Swab     Status: Abnormal   Collection Time: 02/11/21  7:11 PM   Specimen: Nasopharyngeal Swab; Nasopharyngeal(NP) swabs in vial transport medium  Result Value Ref Range Status   SARS Coronavirus 2 by RT PCR POSITIVE (A) NEGATIVE Final    Comment: RESULT CALLED TO, READ BACK BY AND VERIFIED WITH: PRUITT,G @ 2051 ON 02/11/21 BY JUW (NOTE) SARS-CoV-2 target nucleic acids are DETECTED.  The SARS-CoV-2 RNA is generally detectable in upper respiratory specimens during the acute phase of infection. Positive results are indicative of the presence of the identified virus, but do not rule out bacterial infection or co-infection with other pathogens not detected by the test. Clinical correlation with patient history and other diagnostic information is necessary to determine patient infection status. The expected result is Negative.  Fact Sheet for Patients: EntrepreneurPulse.com.au  Fact Sheet for Healthcare Providers: IncredibleEmployment.be  This test is not yet approved or cleared by the [place] FDA and  has been  authorized for detection and/or diagnosis of SARS-CoV-2 by FDA under an Emergency Use Authorization (EUA).  This EUA will remain in effect (meaning this test can be  used) for the duration of  the COVID-19 declaration under Section 564(b)(1) of the Act, 21 U.S.C. section 360bbb-3(b)(1), unless the authorization is terminated or revoked sooner.     Influenza A by PCR NEGATIVE NEGATIVE Final   Influenza B by PCR NEGATIVE NEGATIVE Final    Comment: (NOTE) The Xpert Xpress SARS-CoV-2/FLU/RSV plus assay is intended as an aid in the diagnosis of influenza from Nasopharyngeal swab specimens and should not be used as a sole basis for treatment. Nasal washings and aspirates are unacceptable for Xpert Xpress SARS-CoV-2/FLU/RSV testing.  Fact Sheet for Patients: EntrepreneurPulse.com.au  Fact Sheet for Healthcare Providers: IncredibleEmployment.be  This test is not yet approved or cleared by the [place] FDA and has been authorized for detection and/or diagnosis of SARS-CoV-2 by FDA under an Emergency Use Authorization (EUA). This EUA will remain in effect (meaning this test can be used) for the duration of the COVID-19 declaration under Section 564(b)(1) of  the Act, 21 U.S.C. section 360bbb-3(b)(1), unless the authorization is terminated or revoked.  Performed at [facility], [address], [place], [place] [address]      Radiology Studies: DG Chest Portable 1 View  Result Date: 02/11/2021 CLINICAL DATA:  Cough, COVID positive. EXAM: PORTABLE CHEST 1 VIEW COMPARISON:  CT chest abdomen pelvis 11/16/2016 FINDINGS: The heart size and mediastinal contours are within normal limits. Bibasilar streaky airspace opacity. No pulmonary edema. No pleural effusion. No pneumothorax. No acute osseous abnormality. IMPRESSION: Bibasilar streaky airspace opacity that could represent a combination of atelectasis and infection/inflammation. Electronically Signed   By:  Iven Finn M.D.   On: 02/11/2021 20:42     Scheduled Meds:  albuterol  2 puff Inhalation BID   amLODipine  2.5 mg Oral Daily   vitamin C  500 mg Oral Daily   aspirin EC  81 mg Oral Daily   atorvastatin  10 mg Oral Daily   calcium-vitamin D  1 tablet Oral Q breakfast   dexamethasone  6 mg Oral Q24H   donepezil  10 mg Oral QHS   enoxaparin (LOVENOX) injection  40 mg Subcutaneous Q24H   feeding supplement  237 mL Oral BID BM   insulin aspart  0-5 Units Subcutaneous QHS   insulin aspart  0-6 Units Subcutaneous TID WC   multivitamin with minerals  1 tablet Oral Daily   sodium chloride flush  3 mL Intravenous Q12H   sodium chloride flush  3 mL Intravenous Q12H   sodium chloride flush  3 mL Intravenous Q12H   zinc sulfate  220 mg Oral Daily   Continuous Infusions:  sodium chloride     sodium chloride     remdesivir 100 mg in NS 100 mL 100 mg (02/12/21 1108)     LOS: 1 day    Time spent: 35 minutes    Barton Dubois, MD [facility]   To contact the attending provider between 7A-7P or the covering provider during after hours 7P-7A, please log into the web site [email or web address] and access using universal [place] password for that web site. If you do not have the password, please call the hospital operator.  02/12/2021, 5:36 PM

## 2021-02-12 NOTE — Progress Notes (Signed)
Patient arrived to floor and CBG was checked at 00:15 with value of 56, patient given OJ and crackers. Follow up CBG at 01:31 with a value of 137.   POC CBG values did not upload to result review lab page.

## 2021-02-13 LAB — PHOSPHORUS: Phosphorus: 2.5 mg/dL (ref 2.5–4.6)

## 2021-02-13 LAB — CBC WITH DIFFERENTIAL/PLATELET
Abs Immature Granulocytes: 0.03 10*3/uL (ref 0.00–0.07)
Basophils Absolute: 0 10*3/uL (ref 0.0–0.1)
Basophils Relative: 0 %
Eosinophils Absolute: 0 10*3/uL (ref 0.0–0.5)
Eosinophils Relative: 0 %
HCT: 37.1 % (ref 36.0–46.0)
Hemoglobin: 11.7 g/dL — ABNORMAL LOW (ref 12.0–15.0)
Immature Granulocytes: 0 %
Lymphocytes Relative: 19 %
Lymphs Abs: 1.3 10*3/uL (ref 0.7–4.0)
MCH: 31.3 pg (ref 26.0–34.0)
MCHC: 31.5 g/dL (ref 30.0–36.0)
MCV: 99.2 fL (ref 80.0–100.0)
Monocytes Absolute: 0.2 10*3/uL (ref 0.1–1.0)
Monocytes Relative: 3 %
Neutro Abs: 5.3 10*3/uL (ref 1.7–7.7)
Neutrophils Relative %: 78 %
Platelets: 112 10*3/uL — ABNORMAL LOW (ref 150–400)
RBC: 3.74 MIL/uL — ABNORMAL LOW (ref 3.87–5.11)
RDW: 14.5 % (ref 11.5–15.5)
WBC: 6.9 10*3/uL (ref 4.0–10.5)
nRBC: 0 % (ref 0.0–0.2)

## 2021-02-13 LAB — GLUCOSE, CAPILLARY
Glucose-Capillary: 152 mg/dL — ABNORMAL HIGH (ref 70–99)
Glucose-Capillary: 141 mg/dL — ABNORMAL HIGH (ref 70–99)
Glucose-Capillary: 56 mg/dL — ABNORMAL LOW (ref 70–99)
Glucose-Capillary: 82 mg/dL (ref 70–99)
Glucose-Capillary: 129 mg/dL — ABNORMAL HIGH (ref 70–99)
Glucose-Capillary: 126 mg/dL — ABNORMAL HIGH (ref 70–99)
Glucose-Capillary: 94 mg/dL (ref 70–99)

## 2021-02-13 LAB — COMPREHENSIVE METABOLIC PANEL
ALT: 20 U/L (ref 0–44)
AST: 40 U/L (ref 15–41)
Albumin: 3 g/dL — ABNORMAL LOW (ref 3.5–5.0)
Alkaline Phosphatase: 49 U/L (ref 38–126)
Anion gap: 6 (ref 5–15)
BUN: 12 mg/dL (ref 8–23)
CO2: 26 mmol/L (ref 22–32)
Calcium: 8.7 mg/dL — ABNORMAL LOW (ref 8.9–10.3)
Chloride: 107 mmol/L (ref 98–111)
Creatinine, Ser: 0.72 mg/dL (ref 0.44–1.00)
GFR, Estimated: >60 mL/min (ref >60)
Glucose, Bld: 156 mg/dL — ABNORMAL HIGH (ref 70–99)
Potassium: 3.9 mmol/L (ref 3.5–5.1)
Sodium: 139 mmol/L (ref 135–145)
Total Bilirubin: 0.3 mg/dL (ref 0.3–1.2)
Total Protein: 5.9 g/dL — ABNORMAL LOW (ref 6.5–8.1)

## 2021-02-13 LAB — D-DIMER, QUANTITATIVE: D-Dimer, Quant: 0.85 ug/mL-FEU — ABNORMAL HIGH (ref 0.00–0.50)

## 2021-02-13 LAB — FERRITIN: Ferritin: 103 ng/mL (ref 11–307)

## 2021-02-13 LAB — C-REACTIVE PROTEIN: CRP: 3.5 mg/dL — ABNORMAL HIGH (ref <1.0)

## 2021-02-13 LAB — MAGNESIUM: Magnesium: 1.9 mg/dL (ref 1.7–2.4)

## 2021-02-13 MED ORDER — HALOPERIDOL LACTATE 5 MG/ML IJ SOLN: 0.5000 mg | Freq: Three times a day (TID) | INTRAMUSCULAR | Status: DC | PRN

## 2021-02-13 MED ORDER — LORAZEPAM 2 MG/ML IJ SOLN
2.0000 mg | Freq: Once | INTRAMUSCULAR | Status: AC
  Administered 2021-02-13: 2 mg via INTRAMUSCULAR

## 2021-02-13 MED ORDER — LORAZEPAM 2 MG/ML IJ SOLN
1.0000 mg | Freq: Once | INTRAMUSCULAR | Status: DC
  Filled 2021-02-13: qty 1

## 2021-02-13 NOTE — Progress Notes (Signed)
Patient was disoriented attempted to get out of bed without assistance multiple times and became agitated with staff and removed IV. MD notified and Ativan x1 dose administered at 01:27. Patient has been confused but compliant the rest of the night.

## 2021-02-13 NOTE — Progress Notes (Signed)
PROGRESS NOTE    Katrina Berry  UVO:[id number] DOB: 07-Jan-1918 DOA: 02/11/2021 PCP: Fayrene Helper, MD   Chief Complaint  Patient presents with   Cough    Brief admission narrative:  As per H&P written by Dr. Denton Brick on 02/11/2021  Katrina Berry  is a 87 y.o. female with past medical history relevant for dementia, HLD, HTN who is vaccinated against COVID-19 infection but not boosted presents to the ED with fatigue, weakness, inability to perform ADLs, poor oral intake, cough and shortness of breath -- Patient apparently has had persistent fevers and chills at home, myalgias malaise-- -patient is 87 years old, primary caregiver is a niece who is over 69 years old and currently COVID-positive and is struggling with her health herself.   -Chest x-ray consistent with COVID-pneumonia COVID-19 Labs  Assessment & Plan: 1-pneumonia due to COVID-19 -Requiring 2 L nasal cannula supplementation and with appreciated intermittent coughing spells and bilateral rhonchi at on auscultation. -Continue to wean oxygen as tolerated -Continue vitamin C and zinc -Continue bronchodilators and antitussive medications. -Day 3 out of 5 of remdesivir and will continue steroids. -Follow inflammatory markers.  2-Essential hypertension -Overall stable -Continue as needed labetalol and amlodipine  3-history of dementia -Mentation at baseline -Constant reorientation will be provided -Patient high risk for developing hospital-acquired delirium; Ativan given overnight causing excessive somnolence and sleepiness -Will use low-dose Haldol as needed for agitation control. -Continue nightly Aricept.  4-hyperlipidemia -Continue Lipitor.  5-Pressure injury of skin -Stage II sacral and pressure injury -Continue skin barriers and constant repositioning. -Patient's pressure injury was present at time of admission.  DVT prophylaxis: Lovenox Code Status: Full code Family Communication: Niece  updated over the phone. Disposition:   Status is: Inpatient  Remains inpatient appropriate because:IV treatments appropriate due to intensity of illness or inability to take PO  Dispo: The patient is from: Home              Anticipated d/c is to: To be determined              Patient currently is not medically stable to d/c.   Difficult to place patient No       Consultants:  None  Procedures:  See below for x-ray reports.  Antimicrobials/antiviral Remdesivir (day 3 out of 5)  Subjective: No fever, no chest pain, no nausea, no vomiting; using 2 L nasal".  Positive rhonchi on auscultation.  Very somnolent and sleepy after Ativan given overnight due to agitation and restlessness.  Objective: Vitals:   02/12/21 2006 02/12/21 2058 02/13/21 0537 02/13/21 0728  BP:  (!) 130/41 (!) 139/55   Pulse:  88 (!) 42   Resp:  20 18   Temp:  98.4 F (36.9 C) 97.8 F (36.6 C)   TempSrc:  Oral Oral   SpO2: 95% 95% 97% 97%  Weight:      Height:        Intake/Output Summary (Last 24 hours) at 02/13/2021 1401 Last data filed at 02/13/2021 0900 Gross per 24 hour  Intake 344.41 ml  Output --  Net 344.41 ml   Filed Weights   02/12/21 0004  Weight: 66.3 kg    Examination: General exam:, No chest pain, no nausea, no vomiting.  Ver somnolent and sleepy after receiving Ativan overnight.  Per reports patient became agitated and confused, an around 1:20 am received 2mg  of ativan.Marland Kitchen Respiratory system: Positive scattered rhonchi, using 2 L supplementation; no wheezing, no crackles. Cardiovascular system: Soft ejection murmur, no  rubs, no gallops, no JVD.   Gastrointestinal system: Abdomen is nondistended, soft and nontender. No organomegaly or masses felt. Normal bowel sounds heard. Central nervous system: unable to properly assess given current somnolence; patient moving four limbs.        Extremities: No cyanosis or clubbing.  Skin: No petechiae; stage II pressure injury in sacral area  present on admission without signs of superimposed infection. Psychiatry: Unable to properly assess currently secondary to current somnolent state; overnight with agitation and restlessness.  Data Reviewed: I have personally reviewed following labs and imaging studies  CBC: Recent Labs  Lab 02/11/21 1931 02/12/21 0633 02/13/21 0640  WBC 6.1 4.9 6.9  NEUTROABS 3.2 4.1 5.3  HGB 11.5* 11.6* 11.7*  HCT 36.8 37.0 37.1  MCV 100.8* 100.3* 99.2  PLT PLATELET CLUMPS NOTED ON SMEAR, COUNT APPEARS ADEQUATE 123* 112*    Basic Metabolic Panel: Recent Labs  Lab 02/11/21 1931 02/12/21 0633 02/13/21 0640  NA 139 140 139  K 3.6 3.4* 3.9  CL 106 107 107  CO2 26 26 26   GLUCOSE 79 150* 156*  BUN 11 11 12   CREATININE 0.83 0.83 0.72  CALCIUM 8.4* 8.6* 8.7*  MG  --  1.8 1.9  PHOS  --  2.5 2.5    GFR: Estimated Creatinine Clearance: 32.6 mL/min (by C-G formula based on SCr of 0.72 mg/dL).  Liver Function Tests: Recent Labs  Lab 02/11/21 1931 02/12/21 0633 02/13/21 0640  AST 28 34 40  ALT 18 19 20   ALKPHOS 54 56 49  BILITOT 0.6 0.5 0.3  PROT 6.3* 6.5 5.9*  ALBUMIN 3.4* 3.3* 3.0*   CBG: Recent Labs  Lab 02/12/21 2109 02/12/21 2202 02/13/21 0557 02/13/21 0759 02/13/21 1148  GLUCAP 66* 94 152* 141* 129*    Recent Results (from the past 240 hour(s))  Resp Panel by RT-PCR (Flu A&B, Covid) Nasopharyngeal Swab     Status: Abnormal   Collection Time: 02/11/21  7:11 PM   Specimen: Nasopharyngeal Swab; Nasopharyngeal(NP) swabs in vial transport medium  Result Value Ref Range Status   SARS Coronavirus 2 by RT PCR POSITIVE (A) NEGATIVE Final    Comment: RESULT CALLED TO, READ BACK BY AND VERIFIED WITH: PRUITT,G @ 2051 ON 02/11/21 BY JUW (NOTE) SARS-CoV-2 target nucleic acids are DETECTED.  The SARS-CoV-2 RNA is generally detectable in upper respiratory specimens during the acute phase of infection. Positive results are indicative of the presence of the identified virus, but do  not rule out bacterial infection or co-infection with other pathogens not detected by the test. Clinical correlation with patient history and other diagnostic information is necessary to determine patient infection status. The expected result is Negative.  Fact Sheet for Patients: EntrepreneurPulse.com.au  Fact Sheet for Healthcare Providers: IncredibleEmployment.be  This test is not yet approved or cleared by the [place] FDA and  has been authorized for detection and/or diagnosis of SARS-CoV-2 by FDA under an Emergency Use Authorization (EUA).  This EUA will remain in effect (meaning this test can be  used) for the duration of  the COVID-19 declaration under Section 564(b)(1) of the Act, 21 U.S.C. section 360bbb-3(b)(1), unless the authorization is terminated or revoked sooner.     Influenza A by PCR NEGATIVE NEGATIVE Final   Influenza B by PCR NEGATIVE NEGATIVE Final    Comment: (NOTE) The Xpert Xpress SARS-CoV-2/FLU/RSV plus assay is intended as an aid in the diagnosis of influenza from Nasopharyngeal swab specimens and should not be used as a sole basis  for treatment. Nasal washings and aspirates are unacceptable for Xpert Xpress SARS-CoV-2/FLU/RSV testing.  Fact Sheet for Patients: EntrepreneurPulse.com.au  Fact Sheet for Healthcare Providers: IncredibleEmployment.be  This test is not yet approved or cleared by the [place] FDA and has been authorized for detection and/or diagnosis of SARS-CoV-2 by FDA under an Emergency Use Authorization (EUA). This EUA will remain in effect (meaning this test can be used) for the duration of the COVID-19 declaration under Section 564(b)(1) of the Act, 21 U.S.C. section 360bbb-3(b)(1), unless the authorization is terminated or revoked.  Performed at [facility], [address], [place], [place] [address]      Radiology Studies: DG Chest Portable 1  View  Result Date: 02/11/2021 CLINICAL DATA:  Cough, COVID positive. EXAM: PORTABLE CHEST 1 VIEW COMPARISON:  CT chest abdomen pelvis 11/16/2016 FINDINGS: The heart size and mediastinal contours are within normal limits. Bibasilar streaky airspace opacity. No pulmonary edema. No pleural effusion. No pneumothorax. No acute osseous abnormality. IMPRESSION: Bibasilar streaky airspace opacity that could represent a combination of atelectasis and infection/inflammation. Electronically Signed   By: Iven Finn M.D.   On: 02/11/2021 20:42     Scheduled Meds:  albuterol  2 puff Inhalation BID   amLODipine  2.5 mg Oral Daily   vitamin C  500 mg Oral Daily   aspirin EC  81 mg Oral Daily   atorvastatin  10 mg Oral Daily   calcium-vitamin D  1 tablet Oral Q breakfast   dexamethasone  6 mg Oral Q24H   donepezil  10 mg Oral QHS   enoxaparin (LOVENOX) injection  40 mg Subcutaneous Q24H   feeding supplement  237 mL Oral BID BM   insulin aspart  0-5 Units Subcutaneous QHS   insulin aspart  0-6 Units Subcutaneous TID WC   multivitamin with minerals  1 tablet Oral Daily   sodium chloride flush  3 mL Intravenous Q12H   sodium chloride flush  3 mL Intravenous Q12H   sodium chloride flush  3 mL Intravenous Q12H   zinc sulfate  220 mg Oral Daily   Continuous Infusions:  sodium chloride     sodium chloride     remdesivir 100 mg in NS 100 mL 100 mg (02/13/21 1151)     LOS: 2 days    Time spent: 35 minutes    Barton Dubois, MD [facility]   To contact the attending provider between 7A-7P or the covering provider during after hours 7P-7A, please log into the web site [email or web address] and access using universal [place] password for that web site. If you do not have the password, please call the hospital operator.  02/13/2021, 2:01 PM

## 2021-02-13 NOTE — ED Provider Notes (Signed)
[facility] Provider Note   CSN: [id number] Arrival date & time: 02/11/21  1742     History Chief Complaint  Patient presents with   Cough    Katrina Berry is a 87 y.o. female.  HPI    87 year old female comes in with chief complaint of cough.  Patient started developing symptoms 3 to 5 days ago.  Patient lives with her niece who is also elderly and sick with COVID-19.  Patient is fully vaccinated, has not received a booster.  According to the niece, patient's p.o. intake has gone down considerably and she has been weak, they are having difficulty taking care of her because she too is weak and feeling unwell.  CODE STATUS is DNR, DNI.  Patient reports having some cough, and chest tightness.  Past Medical History:  Diagnosis Date   Allergic rhinitis 11/25/2016   Arthritis    mild, no known falls uses cane   Dementia (Katrina Berry)    Hypertension    Neurofibroma 05/29/2017   Right breast skin lesion- pathology neurofibroma     Patient Active Problem List   Diagnosis Date Noted   Pressure injury of skin 02/12/2021   Pneumonia due to COVID-19 virus 02/11/2021   Acute respiratory disease due to COVID-19 virus 02/11/2021   Skin yeast infection 12/30/2019   At high risk for falls 12/28/2016   Hyperlipidemia LDL goal <130 08/07/2015   Vitamin D deficiency 08/07/2015   Essential hypertension 07/07/2015   Dementia (Katrina Berry) 07/07/2015   Shoulder pain, right 07/07/2015    Past Surgical History:  Procedure Laterality Date   MASS EXCISION Right 06/07/2017   Procedure: EXCISION SKIN LESION OF RIGHT BREAST;  Surgeon: Virl Cagey, MD;  Location: [facility];  Service: General;  Laterality: Right;   NO PAST SURGERIES       OB History   No obstetric history on file.     Family History  Problem Relation Age of Onset   Diabetes Sister    Hypertension Sister     Social History   Tobacco Use   Smoking status: Never   Smokeless tobacco: Never  Vaping Use    Vaping Use: Never used  Substance Use Topics   Alcohol use: No   Drug use: No    Home Medications Prior to Admission medications   Medication Sig Start Date End Date Taking? Authorizing Provider  acetaminophen (TYLENOL) 500 MG tablet Take 500 mg by mouth every 6 (six) hours as needed for mild pain or headache.    Yes [provider]  aspirin EC 81 MG tablet Take 1 tablet (81 mg total) by mouth daily. 07/07/15  Yes Fayrene Helper, MD  atorvastatin (LIPITOR) 10 MG tablet TAKE 1 TABLET BY MOUTH EVERY DAY 12/12/20  Yes Fayrene Helper, MD  Calcium Carb-Cholecalciferol 600-800 MG-UNIT TABS Take 1 tablet by mouth daily.   Yes [provider]  donepezil (ARICEPT) 10 MG tablet TAKE 1 TABLET BY MOUTH EVERYDAY AT BEDTIME Patient taking differently: Take 10 mg by mouth at bedtime. 09/28/20  Yes Fayrene Helper, MD  losartan (COZAAR) 100 MG tablet TAKE 1 TABLET BY MOUTH EVERY DAY 09/28/20  Yes Fayrene Helper, MD  Multiple Vitamin (MULTIVITAMIN WITH MINERALS) TABS tablet Take 1 tablet by mouth daily.   Yes [provider]  nystatin (MYCOSTATIN/NYSTOP) powder APPLY 1 APPLICATION TOPICALLY 3 (THREE) TIMES DAILY. 11/01/20  Yes Noreene Larsson, NP    Allergies    Patient has no  known allergies.  Review of Systems   Review of Systems  Constitutional:  Positive for activity change.  Respiratory:  Positive for cough.   Cardiovascular:  Positive for chest pain.  Allergic/Immunologic: Negative for immunocompromised state.  Hematological:  Does not bruise/bleed easily.  All other systems reviewed and are negative.  Physical Exam Updated Vital Signs BP (!) 130/41 (BP Location: Right Arm)   Pulse 88   Temp 98.4 F (36.9 C) (Oral)   Resp 20   Ht 5\' 2"  (1.575 m)   Wt 66.3 kg   SpO2 95%   BMI 26.73 kg/m   Physical Exam Vitals and nursing note reviewed.  Constitutional:      Appearance: She is well-developed.  HENT:     Head: Atraumatic.  Cardiovascular:      Rate and Rhythm: Normal rate.  Pulmonary:     Effort: Pulmonary effort is normal.  Musculoskeletal:     Cervical back: Normal range of motion and neck supple.  Skin:    General: Skin is warm and dry.  Neurological:     Mental Status: She is alert and oriented to person, place, and time.    ED Results / Procedures / Treatments   Labs (all labs ordered are listed, but only abnormal results are displayed) Labs Reviewed  RESP PANEL BY RT-PCR (FLU A&B, COVID) ARPGX2 - Abnormal; Notable for the following components:      Result Value   SARS Coronavirus 2 by RT PCR POSITIVE (*)    All other components within normal limits  CBC WITH DIFFERENTIAL/PLATELET - Abnormal; Notable for the following components:   RBC 3.65 (*)    Hemoglobin 11.5 (*)    MCV 100.8 (*)    All other components within normal limits  COMPREHENSIVE METABOLIC PANEL - Abnormal; Notable for the following components:   Calcium 8.4 (*)    Total Protein 6.3 (*)    Albumin 3.4 (*)    All other components within normal limits  D-DIMER, QUANTITATIVE - Abnormal; Notable for the following components:   D-Dimer, Quant 1.07 (*)    All other components within normal limits  C-REACTIVE PROTEIN - Abnormal; Notable for the following components:   CRP 5.7 (*)    All other components within normal limits  CBC WITH DIFFERENTIAL/PLATELET - Abnormal; Notable for the following components:   RBC 3.69 (*)    Hemoglobin 11.6 (*)    MCV 100.3 (*)    Platelets 123 (*)    All other components within normal limits  COMPREHENSIVE METABOLIC PANEL - Abnormal; Notable for the following components:   Potassium 3.4 (*)    Glucose, Bld 150 (*)    Calcium 8.6 (*)    Albumin 3.3 (*)    All other components within normal limits  C-REACTIVE PROTEIN - Abnormal; Notable for the following components:   CRP 6.4 (*)    All other components within normal limits  D-DIMER, QUANTITATIVE - Abnormal; Notable for the following components:   D-Dimer,  Quant 1.49 (*)    All other components within normal limits  GLUCOSE, CAPILLARY - Abnormal; Notable for the following components:   Glucose-Capillary 131 (*)    All other components within normal limits  GLUCOSE, CAPILLARY - Abnormal; Notable for the following components:   Glucose-Capillary 119 (*)    All other components within normal limits  GLUCOSE, CAPILLARY - Abnormal; Notable for the following components:   Glucose-Capillary 137 (*)    All other components within normal limits  GLUCOSE,  CAPILLARY - Abnormal; Notable for the following components:   Glucose-Capillary 150 (*)    All other components within normal limits  GLUCOSE, CAPILLARY - Abnormal; Notable for the following components:   Glucose-Capillary 58 (*)    All other components within normal limits  GLUCOSE, CAPILLARY - Abnormal; Notable for the following components:   Glucose-Capillary 66 (*)    All other components within normal limits  LACTIC ACID, PLASMA  LACTIC ACID, PLASMA  PROCALCITONIN  LACTATE DEHYDROGENASE  FERRITIN  TRIGLYCERIDES  FIBRINOGEN  FERRITIN  MAGNESIUM  PHOSPHORUS  GLUCOSE, CAPILLARY  CBC WITH DIFFERENTIAL/PLATELET  COMPREHENSIVE METABOLIC PANEL  C-REACTIVE PROTEIN  D-DIMER, QUANTITATIVE  FERRITIN  MAGNESIUM  PHOSPHORUS  CBG MONITORING, ED    EKG EKG Interpretation  Date/Time:  Saturday February 11 2021 18:02:38 EDT Ventricular Rate:  52 PR Interval:  233 QRS Duration: 96 QT Interval:  460 QTC Calculation: 428 R Axis:   14 Text Interpretation: Sinus rhythm Prolonged PR interval Borderline T abnormalities, anterior leads No acute changes No significant change since last tracing Confirmed by Varney Biles ([id number]) on 02/11/2021 8:04:28 PM  Radiology DG Chest Portable 1 View  Result Date: 02/11/2021 CLINICAL DATA:  Cough, COVID positive. EXAM: PORTABLE CHEST 1 VIEW COMPARISON:  CT chest abdomen pelvis 11/16/2016 FINDINGS: The heart size and mediastinal contours are within normal  limits. Bibasilar streaky airspace opacity. No pulmonary edema. No pleural effusion. No pneumothorax. No acute osseous abnormality. IMPRESSION: Bibasilar streaky airspace opacity that could represent a combination of atelectasis and infection/inflammation. Electronically Signed   By: Iven Finn M.D.   On: 02/11/2021 20:42    Procedures Procedures   Medications Ordered in ED Medications  sodium chloride flush (NS) 0.9 % injection 3 mL (3 mLs Intravenous Not Given 02/12/21 2101)  sodium chloride flush (NS) 0.9 % injection 3 mL (has no administration in time range)  0.9 %  sodium chloride infusion (has no administration in time range)  dexamethasone (DECADRON) tablet 6 mg (6 mg Oral Given 02/12/21 2047)  guaiFENesin-dextromethorphan (ROBITUSSIN DM) 100-10 MG/5ML syrup 10 mL (has no administration in time range)  chlorpheniramine-HYDROcodone (TUSSIONEX) 10-8 MG/5ML suspension 5 mL (has no administration in time range)  ascorbic acid (VITAMIN C) tablet 500 mg (500 mg Oral Given 02/12/21 1059)  zinc sulfate capsule 220 mg (220 mg Oral Given 02/12/21 1059)  multivitamin with minerals tablet 1 tablet (1 tablet Oral Given 02/12/21 1059)  insulin aspart (novoLOG) injection 0-6 Units (0 Units Subcutaneous Not Given 02/12/21 1800)  insulin aspart (novoLOG) injection 0-5 Units (0 Units Subcutaneous Hold 02/12/21 2100)  remdesivir 100 mg in sodium chloride 0.9 % 100 mL IVPB (0 mg Intravenous Stopped 02/11/21 2353)    Followed by  remdesivir 100 mg in sodium chloride 0.9 % 100 mL IVPB (100 mg Intravenous New Bag/Given 02/12/21 1108)  amLODipine (NORVASC) tablet 2.5 mg (2.5 mg Oral Given 02/12/21 1100)  labetalol (NORMODYNE) injection 10 mg (has no administration in time range)  aspirin EC tablet 81 mg (81 mg Oral Given 02/12/21 1100)  atorvastatin (LIPITOR) tablet 10 mg (10 mg Oral Given 02/12/21 1059)  donepezil (ARICEPT) tablet 10 mg (10 mg Oral Given 02/12/21 2047)  sodium chloride flush (NS) 0.9 % injection 3  mL (3 mLs Intravenous Not Given 02/12/21 2101)  sodium chloride flush (NS) 0.9 % injection 3 mL (3 mLs Intravenous Given 02/12/21 2101)  sodium chloride flush (NS) 0.9 % injection 3 mL (has no administration in time range)  0.9 %  sodium chloride infusion (has  no administration in time range)  acetaminophen (TYLENOL) tablet 650 mg (has no administration in time range)    Or  acetaminophen (TYLENOL) suppository 650 mg (has no administration in time range)  traZODone (DESYREL) tablet 50 mg (50 mg Oral Given 02/12/21 2047)  polyethylene glycol (MIRALAX / GLYCOLAX) packet 17 g (has no administration in time range)  bisacodyl (DULCOLAX) suppository 10 mg (has no administration in time range)  ondansetron (ZOFRAN) tablet 4 mg (has no administration in time range)    Or  ondansetron (ZOFRAN) injection 4 mg (has no administration in time range)  enoxaparin (LOVENOX) injection 40 mg (40 mg Subcutaneous Given 02/12/21 1101)  calcium-vitamin D (OSCAL WITH D) 500-200 MG-UNIT per tablet 1 tablet (1 tablet Oral Given 02/12/21 0812)  feeding supplement (ENSURE ENLIVE / ENSURE PLUS) liquid 237 mL (237 mLs Oral Given 02/12/21 1745)  albuterol (VENTOLIN HFA) 108 (90 Base) MCG/ACT inhaler 2 puff (2 puffs Inhalation Given 02/12/21 1949)  sodium chloride 0.9 % bolus 500 mL (0 mLs Intravenous Stopped 02/11/21 2045)  LORazepam (ATIVAN) injection 1 mg (1 mg Intravenous Given 02/12/21 6837)    ED Course  I have reviewed the triage vital signs and the nursing notes.  Pertinent labs & imaging results that were available during my care of the patient were reviewed by me and considered in my medical decision making (see chart for details).    MDM Rules/Calculators/A&P                          Pt comes in with cc of cough.  + covid concerns - confirmed. No hypoxia.  No resp distress. Labs reassuring.  She is profoundly weak, has reduced p.o. intake, would be best to admit as caregivers are also sick and unable to take  care of the patient.  High possibility of her declining and having poor outcome.  Final Clinical Impression(s) / ED Diagnoses Final diagnoses:  [id number]    Rx / DC Orders ED Discharge Orders     None        Varney Biles, MD 02/13/21 [phone]

## 2021-02-13 NOTE — TOC Initial Note (Signed)
Transition of Care [facility]) - Initial/Assessment Note    Patient Details  Name: Katrina Berry MRN: [id number] Date of Birth: 06/20/1918  [facility]) CM/SW Contact:    Ihor Gully, LCSW Phone Number: 02/13/2021, 3:30 PM  Clinical Narrative:                 Patient from home where she lives with her sister who is in rehab. Patient admitted COVID+. Has caregiver M-F from 8-4. Caregiver provides meals, does laundry, organizes medications and assists with ADLs. Niece, Katrina Berry, helps patient also. At baseline, patient ambulates with a walker.  Ms. Katrina Berry is agreeable to short term rehab. Advised that facility accepting COVID+ patients would have to be located.    Expected Discharge Plan: Skilled Nursing Facility Barriers to Discharge: Continued Medical Work up   Patient Goals and CMS Choice Patient states their goals for this hospitalization and ongoing recovery are:: rehab then home      Expected Discharge Plan and Services Expected Discharge Plan: [place] Acute Care Choice: [place] arrangements for the past 2 months: Single Family Home                                      Prior Living Arrangements/Services Living arrangements for the past 2 months: Single Family Home Lives with:: Siblings Patient language and need for interpreter reviewed:: Yes Do you feel safe going back to the place where you live?: Yes      Need for Family Participation in Patient Care: Yes (Comment) Care giver support system in place?: Yes (comment)   Criminal Activity/Legal Involvement Pertinent to Current Situation/Hospitalization: No - Comment as needed  Activities of Daily Living Home Assistive Devices/Equipment: CBG Meter, Eyeglasses ADL Screening (condition at time of admission) Patient's cognitive ability adequate to safely complete daily activities?: No Is the patient deaf or have difficulty hearing?: Yes Does  the patient have difficulty seeing, even when wearing glasses/contacts?: Yes Does the patient have difficulty concentrating, remembering, or making decisions?: Yes Patient able to express need for assistance with ADLs?: No Does the patient have difficulty dressing or bathing?: Yes Independently performs ADLs?: No Communication: Needs assistance Is this a change from baseline?: Pre-admission baseline Dressing (OT): Needs assistance Is this a change from baseline?: Pre-admission baseline Grooming: Needs assistance Is this a change from baseline?: Pre-admission baseline Feeding: Independent with device (comment) Bathing: Needs assistance Is this a change from baseline?: Pre-admission baseline Toileting: Needs assistance Is this a change from baseline?: Pre-admission baseline In/Out Bed: Needs assistance Is this a change from baseline?: Pre-admission baseline Walks in Home: Needs assistance Is this a change from baseline?: Pre-admission baseline Does the patient have difficulty walking or climbing stairs?: Yes Weakness of Legs: Both Weakness of Arms/Hands: Both  Permission Sought/Granted Permission sought to share information with : Family Supports    Share Information with NAME: Katrina Berry, niece           Emotional Assessment       Orientation: : Oriented to  Time, Oriented to Self Alcohol / Substance Use: Not Applicable Psych Involvement: Outpatient Provider  Admission diagnosis:  Pneumonia due to COVID-19 virus [U07.1, J12.82] COVID-19 [U07.1] Patient Active Problem List   Diagnosis Date Noted   Pressure injury of skin 02/12/2021   Pneumonia due to COVID-19 virus 02/11/2021   Acute respiratory disease due to  COVID-19 virus 02/11/2021   Skin yeast infection 12/30/2019   At high risk for falls 12/28/2016   Hyperlipidemia LDL goal <130 08/07/2015   Vitamin D deficiency 08/07/2015   Essential hypertension 07/07/2015   Dementia ([place]) 07/07/2015   Shoulder pain,  right 07/07/2015   PCP:  Fayrene Helper, MD Pharmacy:   [id number] - [place] [place] [address] Phone: [phone] Fax: [phone]     Social Determinants of Health (SDOH) Interventions    Readmission Risk Interventions No flowsheet data found.

## 2021-02-13 NOTE — NC FL2 (Signed)
[place] LEVEL OF CARE SCREENING TOOL     IDENTIFICATION  Patient Name: Katrina Berry Birthdate: May 01, 1918 Sex: female Admission Date (Current Location): 02/11/2021  [place] and [place] Number:  [place] [id number] [place] and Address:  [place] [address] [place]      Provider Number: [id number]  Attending Physician Name and Address:  Barton Dubois, MD  Relative Name and Phone Number:  Horris Latino (Niece)   [phone]    Current Level of Care: Hospital Recommended Level of Care: [place] Prior Approval Number:    Date Approved/Denied:   PASRR Number: [id number]  Discharge Plan: SNF    Current Diagnoses: Patient Active Problem List   Diagnosis Date Noted   Pressure injury of skin 02/12/2021   Pneumonia due to COVID-19 virus 02/11/2021   Acute respiratory disease due to COVID-19 virus 02/11/2021   Skin yeast infection 12/30/2019   At high risk for falls 12/28/2016   Hyperlipidemia LDL goal <130 08/07/2015   Vitamin D deficiency 08/07/2015   Essential hypertension 07/07/2015   Dementia ([place]) 07/07/2015   Shoulder pain, right 07/07/2015    Orientation RESPIRATION BLADDER Height & Weight     Self, Place  O2 (2L) Incontinent Weight: 146 lb 2.6 oz (66.3 kg) Height:  5\' 2"  (157.5 cm)  BEHAVIORAL SYMPTOMS/MOOD NEUROLOGICAL BOWEL NUTRITION STATUS      Continent Diet (Regular)  AMBULATORY STATUS COMMUNICATION OF NEEDS Skin   Limited Assist Verbally PU Stage and Appropriate Care (Stage 2, Sacrum)                       Personal Care Assistance Level of Assistance  Bathing, Feeding, Dressing Bathing Assistance: Limited assistance Feeding assistance: Independent Dressing Assistance: Limited assistance     Functional Limitations Info  Sight, Hearing, Speech Sight Info: Adequate Hearing Info: Adequate Speech Info: Adequate    SPECIAL CARE FACTORS FREQUENCY  PT (By licensed PT)      PT Frequency: 5x/week              Contractures Contractures Info: Not present    Additional Factors Info  Code Status, Allergies, Isolation Precautions Code Status Info: Full Code Allergies Info: NKA     Isolation Precautions Info: 02/11/21 COVID+     Current Medications (02/13/2021):  This is the current hospital active medication list Current Facility-Administered Medications  Medication Dose Route Frequency Provider Last Rate Last Admin   0.9 %  sodium chloride infusion  250 mL Intravenous PRN Emokpae, Courage, MD       0.9 %  sodium chloride infusion  250 mL Intravenous PRN Emokpae, Courage, MD       acetaminophen (TYLENOL) tablet 650 mg  650 mg Oral Q6H PRN Emokpae, Courage, MD       Or   acetaminophen (TYLENOL) suppository 650 mg  650 mg Rectal Q6H PRN Emokpae, Courage, MD       albuterol (VENTOLIN HFA) 108 (90 Base) MCG/ACT inhaler 2 puff  2 puff Inhalation BID Barton Dubois, MD   2 puff at 02/13/21 0727   amLODipine (NORVASC) tablet 2.5 mg  2.5 mg Oral Daily Emokpae, Courage, MD   2.5 mg at 02/13/21 1500   ascorbic acid (VITAMIN C) tablet 500 mg  500 mg Oral Daily Emokpae, Courage, MD   500 mg at 02/13/21 1500   aspirin EC tablet 81 mg  81 mg Oral Daily Emokpae, Courage, MD   81 mg at 02/13/21 1500  atorvastatin (LIPITOR) tablet 10 mg  10 mg Oral Daily Emokpae, Courage, MD   10 mg at 02/13/21 1500   bisacodyl (DULCOLAX) suppository 10 mg  10 mg Rectal Daily PRN Roxan Hockey, MD       calcium-vitamin D (OSCAL WITH D) 500-200 MG-UNIT per tablet 1 tablet  1 tablet Oral Q breakfast Denton Brick, Courage, MD   1 tablet at 02/13/21 0837   chlorpheniramine-HYDROcodone (TUSSIONEX) 10-8 MG/5ML suspension 5 mL  5 mL Oral Q12H PRN Emokpae, Courage, MD       dexamethasone (DECADRON) tablet 6 mg  6 mg Oral Q24H Emokpae, Courage, MD   6 mg at 02/12/21 2047   donepezil (ARICEPT) tablet 10 mg  10 mg Oral QHS Emokpae, Courage, MD   10 mg at 02/12/21 2047   enoxaparin (LOVENOX) injection  40 mg  40 mg Subcutaneous Q24H Emokpae, Courage, MD   40 mg at 02/13/21 1233   feeding supplement (ENSURE ENLIVE / ENSURE PLUS) liquid 237 mL  237 mL Oral BID BM Emokpae, Courage, MD   237 mL at 02/13/21 1501   guaiFENesin-dextromethorphan (ROBITUSSIN DM) 100-10 MG/5ML syrup 10 mL  10 mL Oral Q4H PRN Emokpae, Courage, MD       haloperidol lactate (HALDOL) injection 0.5 mg  0.5 mg Intravenous Q8H PRN Barton Dubois, MD       insulin aspart (novoLOG) injection 0-5 Units  0-5 Units Subcutaneous QHS Emokpae, Courage, MD       insulin aspart (novoLOG) injection 0-6 Units  0-6 Units Subcutaneous TID WC Emokpae, Courage, MD       labetalol (NORMODYNE) injection 10 mg  10 mg Intravenous Q4H PRN Emokpae, Courage, MD       multivitamin with minerals tablet 1 tablet  1 tablet Oral Daily Emokpae, Courage, MD   1 tablet at 02/13/21 1500   ondansetron (ZOFRAN) tablet 4 mg  4 mg Oral Q6H PRN Emokpae, Courage, MD       Or   ondansetron (ZOFRAN) injection 4 mg  4 mg Intravenous Q6H PRN Emokpae, Courage, MD       polyethylene glycol (MIRALAX / GLYCOLAX) packet 17 g  17 g Oral Daily PRN Emokpae, Courage, MD       remdesivir 100 mg in sodium chloride 0.9 % 100 mL IVPB  100 mg Intravenous Daily Emokpae, Courage, MD 200 mL/hr at 02/13/21 1151 100 mg at 02/13/21 1151   sodium chloride flush (NS) 0.9 % injection 3 mL  3 mL Intravenous Q12H Emokpae, Courage, MD       sodium chloride flush (NS) 0.9 % injection 3 mL  3 mL Intravenous PRN Emokpae, Courage, MD       sodium chloride flush (NS) 0.9 % injection 3 mL  3 mL Intravenous Q12H Emokpae, Courage, MD       sodium chloride flush (NS) 0.9 % injection 3 mL  3 mL Intravenous Q12H Emokpae, Courage, MD   3 mL at 02/13/21 1233   sodium chloride flush (NS) 0.9 % injection 3 mL  3 mL Intravenous PRN Emokpae, Courage, MD       traZODone (DESYREL) tablet 50 mg  50 mg Oral QHS PRN Denton Brick, Courage, MD   50 mg at 02/12/21 2047   zinc sulfate capsule 220 mg  220 mg Oral Daily  Emokpae, Courage, MD   220 mg at 02/13/21 1459     Discharge Medications: Please see discharge summary for a list of discharge medications.  Relevant Imaging Results:  Relevant Lab Results:  Additional Information SSN [id number] Clydene Pugh, LCSW

## 2021-02-13 NOTE — Progress Notes (Signed)
Initial Nutrition Assessment  DOCUMENTATION CODES:      INTERVENTION:  Continue Ensure Enlive po BID, each supplement provides 350 kcal and 20 grams of protein   Add Magic cup TID with meals, each supplement provides 290 kcal and 9 grams of protein   Agree with Regular diet   Assistance with meals due to her weakened state   NUTRITION DIAGNOSIS:   Inadequate oral intake related to acute illness (COVID pneumonia) as evidenced by meal completion < 25%.   GOAL: Pt to meet >/= 90% of their estimated nutrition needs      MONITOR:  Supplement acceptance, PO intake, Skin, Weight trends  REASON FOR ASSESSMENT:   Malnutrition Screening Tool    ASSESSMENT: Patient is a 87 yo female with lives at home with sister. She presents with cough, weakness, poor appetite, stage 2 PI and COVID+. History of Dementia, HTN and HLD.   RD discussed patient nursing. She is being assisted with feeding. Meal intake at breakfast 0%. Provided Glucerna shake -100% consumed.   Unable to awaken patient during visit. She has been sleepy today after requiring Ativan last night.   Weight history reviewed. High risk for malnutrition given her age, poor appetite and COVID- PNA.   Medications include: vitamin C, Calcium Vit D, Decadron, Aricept, MVI, zinc   Labs: BMP Latest Ref Rng & Units 02/13/2021 02/12/2021 02/11/2021  Glucose 70 - 99 mg/dL 156(H) 150(H) 79  BUN 8 - 23 mg/dL 12 11 11   Creatinine 0.44 - 1.00 mg/dL 0.72 0.83 0.83  BUN/Creat Ratio 6 - 22 (calc) - - -  Sodium 135 - 145 mmol/L 139 140 139  Potassium 3.5 - 5.1 mmol/L 3.9 3.4(L) 3.6  Chloride 98 - 111 mmol/L 107 107 106  CO2 22 - 32 mmol/L 26 26 26   Calcium 8.9 - 10.3 mg/dL 8.7(L) 8.6(L) 8.4(L)      NUTRITION - FOCUSED PHYSICAL EXAM: Unable to complete Nutrition-Focused physical exam at this time.     Diet Order:   Diet Order             Diet regular Room service appropriate? Yes; Fluid consistency: Thin  Diet effective now                    EDUCATION NEEDS:  Not appropriate for education at this time  Skin:  Skin Assessment: Skin Integrity Issues: Skin Integrity Issues:: Stage II Stage II: sacrum  Last BM:  02/12/21  Height:   Ht Readings from Last 1 Encounters:  02/12/21 5\' 2"  (1.575 m)    Weight:   Wt Readings from Last 1 Encounters:  02/12/21 66.3 kg    Ideal Body Weight:   50 kg  BMI:  Body mass index is 26.73 kg/m.  Estimated Nutritional Needs:   Kcal:  1600-1700  Protein:  85-92  Fluid:  >1500 ml daily   Colman Cater MS,RD,CSG,LDN Contact: [email or web address]

## 2021-02-14 LAB — PHOSPHORUS: Phosphorus: 3.1 mg/dL (ref 2.5–4.6)

## 2021-02-14 LAB — CBC WITH DIFFERENTIAL/PLATELET
Abs Immature Granulocytes: 0.03 10*3/uL (ref 0.00–0.07)
Basophils Absolute: 0 10*3/uL (ref 0.0–0.1)
Basophils Relative: 0 %
Eosinophils Absolute: 0 10*3/uL (ref 0.0–0.5)
Eosinophils Relative: 0 %
HCT: 37.8 % (ref 36.0–46.0)
Hemoglobin: 11.9 g/dL — ABNORMAL LOW (ref 12.0–15.0)
Immature Granulocytes: 0 %
Lymphocytes Relative: 16 %
Lymphs Abs: 1.5 10*3/uL (ref 0.7–4.0)
MCH: 31.1 pg (ref 26.0–34.0)
MCHC: 31.5 g/dL (ref 30.0–36.0)
MCV: 98.7 fL (ref 80.0–100.0)
Monocytes Absolute: 0.3 10*3/uL (ref 0.1–1.0)
Monocytes Relative: 3 %
Neutro Abs: 7.5 10*3/uL (ref 1.7–7.7)
Neutrophils Relative %: 81 %
Platelets: 141 10*3/uL — ABNORMAL LOW (ref 150–400)
RBC: 3.83 MIL/uL — ABNORMAL LOW (ref 3.87–5.11)
RDW: 14.4 % (ref 11.5–15.5)
WBC: 9.3 10*3/uL (ref 4.0–10.5)
nRBC: 0 % (ref 0.0–0.2)

## 2021-02-14 LAB — COMPREHENSIVE METABOLIC PANEL
ALT: 26 U/L (ref 0–44)
AST: 43 U/L — ABNORMAL HIGH (ref 15–41)
Albumin: 3.2 g/dL — ABNORMAL LOW (ref 3.5–5.0)
Alkaline Phosphatase: 52 U/L (ref 38–126)
Anion gap: 7 (ref 5–15)
BUN: 18 mg/dL (ref 8–23)
CO2: 28 mmol/L (ref 22–32)
Calcium: 8.9 mg/dL (ref 8.9–10.3)
Chloride: 104 mmol/L (ref 98–111)
Creatinine, Ser: 0.82 mg/dL (ref 0.44–1.00)
GFR, Estimated: >60 mL/min (ref >60)
Glucose, Bld: 137 mg/dL — ABNORMAL HIGH (ref 70–99)
Potassium: 4 mmol/L (ref 3.5–5.1)
Sodium: 139 mmol/L (ref 135–145)
Total Bilirubin: 0.4 mg/dL (ref 0.3–1.2)
Total Protein: 6.1 g/dL — ABNORMAL LOW (ref 6.5–8.1)

## 2021-02-14 LAB — GLUCOSE, CAPILLARY
Glucose-Capillary: 132 mg/dL — ABNORMAL HIGH (ref 70–99)
Glucose-Capillary: 136 mg/dL — ABNORMAL HIGH (ref 70–99)
Glucose-Capillary: 90 mg/dL (ref 70–99)
Glucose-Capillary: 114 mg/dL — ABNORMAL HIGH (ref 70–99)

## 2021-02-14 LAB — MAGNESIUM: Magnesium: 2 mg/dL (ref 1.7–2.4)

## 2021-02-14 LAB — C-REACTIVE PROTEIN: CRP: 1.5 mg/dL — ABNORMAL HIGH (ref <1.0)

## 2021-02-14 LAB — FERRITIN: Ferritin: 118 ng/mL (ref 11–307)

## 2021-02-14 LAB — D-DIMER, QUANTITATIVE: D-Dimer, Quant: 0.69 ug/mL-FEU — ABNORMAL HIGH (ref 0.00–0.50)

## 2021-02-14 NOTE — Progress Notes (Signed)
PROGRESS NOTE    Katrina Berry  WNU:[id number] DOB: 11-15-1917 DOA: 02/11/2021 PCP: Fayrene Helper, MD   Chief Complaint  Patient presents with   Cough    Brief admission narrative:   Katrina Berry  is a 87 y.o. female with past medical history relevant for dementia, HLD, HTN who is vaccinated against COVID-19 infection but not boosted presents to the ED with fatigue, weakness, inability to perform ADLs, poor oral intake, cough and shortness of breath -- Patient apparently has had persistent fevers and chills at home, myalgias malaise-- -patient is 87 years old, primary caregiver is a niece who is over 32 years old and currently COVID-positive and is struggling with her health herself.   -Chest x-ray consistent with COVID-pneumonia  Assessment & Plan:  1)Pneumonia due to COVID-19 with Hypoxia  -Continue to wean oxygen as tolerated -Continue vitamin C and zinc -Continue bronchodilators and antitussive medications. -Day 4 out of 5 of remdesivir and will continue steroids. COVID-19 Labs  Recent Labs    02/11/21 1931 02/12/21 0633 02/13/21 0640 02/14/21 0612 02/14/21 0618  DDIMER 1.07* 1.49* 0.85* 0.69*  --   FERRITIN 73 88 103  --  118  LDH 188  --   --   --   --   CRP 5.7* 6.4* 3.5*  --  1.5*    Lab Results  Component Value Date   SARSCOV2NAA POSITIVE (A) 02/11/2021     2)HTN--- -Overall stable -Continue as needed labetalol and amlodipine  3-history of dementia Baseline cognitive and memory deficits -Continue nightly Aricept.  4-HLD -Continue Lipitor.  5-Pressure injury of skin -Stage II sacral and pressure injury -Continue skin barriers and constant repositioning. -Patient's pressure injury was present at time of admission.  6)Disposition/Social/Ethics--patient is 87 years old, primary caregiver is a niece who is over 68 years old and currently COVID-positive and no longer able to care for patient at this time -Awaiting SNF placement at  this time  DVT prophylaxis: Lovenox Code Status: Full code Family Communication: Niece updated over the phone. Disposition:   Status is: Inpatient  Remains inpatient appropriate because: Awaiting placement to SNF rehab  Dispo: The patient is from: Home              Anticipated d/c is to: SNF              Patient currently is medically stable to d/c.   Difficult to place patient No    Consultants:  None  Procedures:  See below for x-ray reports.  Antimicrobials/antiviral Remdesivir (day 4 out of 5)  Subjective: -Resting comfortably, awaiting SNF placement, No fever  Or chills   No Nausea, Vomiting or Diarrhea  Objective: Vitals:   02/13/21 2100 02/14/21 0517 02/14/21 0730 02/14/21 1443  BP: (!) 142/65 (!) 170/77  134/60  Pulse: (!) 53 (!) 55  (!) 47  Resp: 18 16  20   Temp: 98.2 F (36.8 C) 98.4 F (36.9 C)  (!) 97.5 F (36.4 C)  TempSrc: Oral Oral  Oral  SpO2: 94% 91% 92% 97%  Weight:      Height:        Intake/Output Summary (Last 24 hours) at 02/14/2021 1522 Last data filed at 02/14/2021 1200 Gross per 24 hour  Intake 1600 ml  Output 1 ml  Net 1599 ml   Filed Weights   02/12/21 0004  Weight: 66.3 kg    Examination: Physical Examination: General appearance - alert, elderly  Nose- [name] 2L/min Mental status -underlying/baseline cognitive  and memory deficits Eyes - sclera anicteric Neck - supple, no JVD elevation , Chest -diminished breath sounds with bibasilar rhonchi heart - S1 and S2 normal, regular Abdomen - soft, nontender, nondistended, no CVA tenderness  Neurological -generalized weakness without new acute focal deficit Extremities - no pedal edema noted, intact peripheral pulses Skin - warm, dry  Data Reviewed: I have personally reviewed following labs and imaging studies  CBC: Recent Labs  Lab 02/11/21 1931 02/12/21 0633 02/13/21 0640 02/14/21 0612  WBC 6.1 4.9 6.9 9.3  NEUTROABS 3.2 4.1 5.3 7.5  HGB 11.5* 11.6* 11.7* 11.9*  HCT  36.8 37.0 37.1 37.8  MCV 100.8* 100.3* 99.2 98.7  PLT PLATELET CLUMPS NOTED ON SMEAR, COUNT APPEARS ADEQUATE 123* 112* 141*    Basic Metabolic Panel: Recent Labs  Lab 02/11/21 1931 02/12/21 0633 02/13/21 0640 02/14/21 0612  NA 139 140 139 139  K 3.6 3.4* 3.9 4.0  CL 106 107 107 104  CO2 26 26 26 28   GLUCOSE 79 150* 156* 137*  BUN 11 11 12 18   CREATININE 0.83 0.83 0.72 0.82  CALCIUM 8.4* 8.6* 8.7* 8.9  MG  --  1.8 1.9 2.0  PHOS  --  2.5 2.5 3.1    GFR: Estimated Creatinine Clearance: 31.8 mL/min (by C-G formula based on SCr of 0.82 mg/dL).  Liver Function Tests: Recent Labs  Lab 02/11/21 1931 02/12/21 0633 02/13/21 0640 02/14/21 0612  AST 28 34 40 43*  ALT 18 19 20 26   ALKPHOS 54 56 49 52  BILITOT 0.6 0.5 0.3 0.4  PROT 6.3* 6.5 5.9* 6.1*  ALBUMIN 3.4* 3.3* 3.0* 3.2*   CBG: Recent Labs  Lab 02/13/21 1148 02/13/21 1750 02/13/21 2102 02/14/21 0742 02/14/21 1057  GLUCAP 129* 126* 94 132* 136*    Recent Results (from the past 240 hour(s))  Resp Panel by RT-PCR (Flu A&B, Covid) Nasopharyngeal Swab     Status: Abnormal   Collection Time: 02/11/21  7:11 PM   Specimen: Nasopharyngeal Swab; Nasopharyngeal(NP) swabs in vial transport medium  Result Value Ref Range Status   SARS Coronavirus 2 by RT PCR POSITIVE (A) NEGATIVE Final    Comment: RESULT CALLED TO, READ BACK BY AND VERIFIED WITH: PRUITT,G @ 2051 ON 02/11/21 BY JUW (NOTE) SARS-CoV-2 target nucleic acids are DETECTED.  The SARS-CoV-2 RNA is generally detectable in upper respiratory specimens during the acute phase of infection. Positive results are indicative of the presence of the identified virus, but do not rule out bacterial infection or co-infection with other pathogens not detected by the test. Clinical correlation with patient history and other diagnostic information is necessary to determine patient infection status. The expected result is Negative.  Fact Sheet for  Patients: EntrepreneurPulse.com.au  Fact Sheet for Healthcare Providers: IncredibleEmployment.be  This test is not yet approved or cleared by the [place] FDA and  has been authorized for detection and/or diagnosis of SARS-CoV-2 by FDA under an Emergency Use Authorization (EUA).  This EUA will remain in effect (meaning this test can be  used) for the duration of  the COVID-19 declaration under Section 564(b)(1) of the Act, 21 U.S.C. section 360bbb-3(b)(1), unless the authorization is terminated or revoked sooner.     Influenza A by PCR NEGATIVE NEGATIVE Final   Influenza B by PCR NEGATIVE NEGATIVE Final    Comment: (NOTE) The Xpert Xpress SARS-CoV-2/FLU/RSV plus assay is intended as an aid in the diagnosis of influenza from Nasopharyngeal swab specimens and should not be used as a sole  basis for treatment. Nasal washings and aspirates are unacceptable for Xpert Xpress SARS-CoV-2/FLU/RSV testing.  Fact Sheet for Patients: EntrepreneurPulse.com.au  Fact Sheet for Healthcare Providers: IncredibleEmployment.be  This test is not yet approved or cleared by the [place] FDA and has been authorized for detection and/or diagnosis of SARS-CoV-2 by FDA under an Emergency Use Authorization (EUA). This EUA will remain in effect (meaning this test can be used) for the duration of the COVID-19 declaration under Section 564(b)(1) of the Act, 21 U.S.C. section 360bbb-3(b)(1), unless the authorization is terminated or revoked.  Performed at [facility], [address], [place], [place] [address]      Radiology Studies: No results found.   Scheduled Meds:  albuterol  2 puff Inhalation BID   amLODipine  2.5 mg Oral Daily   vitamin C  500 mg Oral Daily   aspirin EC  81 mg Oral Daily   atorvastatin  10 mg Oral Daily   calcium-vitamin D  1 tablet Oral Q breakfast   dexamethasone  6 mg Oral Q24H   donepezil   10 mg Oral QHS   enoxaparin (LOVENOX) injection  40 mg Subcutaneous Q24H   feeding supplement  237 mL Oral BID BM   insulin aspart  0-5 Units Subcutaneous QHS   insulin aspart  0-6 Units Subcutaneous TID WC   multivitamin with minerals  1 tablet Oral Daily   sodium chloride flush  3 mL Intravenous Q12H   sodium chloride flush  3 mL Intravenous Q12H   sodium chloride flush  3 mL Intravenous Q12H   zinc sulfate  220 mg Oral Daily   Continuous Infusions:  sodium chloride     sodium chloride     remdesivir 100 mg in NS 100 mL 100 mg (02/14/21 1025)     LOS: 3 days    Roxan Hockey, MD [facility]   To contact the attending provider between 7A-7P or the covering provider during after hours 7P-7A, please log into the web site [email or web address] and access using universal [place] password for that web site. If you do not have the password, please call the hospital operator.  02/14/2021, 3:22 PM

## 2021-02-14 NOTE — TOC Progression Note (Signed)
Transition of Care [facility]) - Progression Note    Patient Details  Name: Katrina Berry MRN: [id number] Date of Birth: May 18, 1918  [facility]) CM/SW Contact  Ihor Gully, LCSW Phone Number: 02/14/2021, 10:29 AM  Clinical Narrative:    Lorenza Chick with [facility] indicates that facility can admit patient on 6/29 as a Covid bed will be available on that date.    Expected Discharge Plan: [place] Barriers to Discharge: Continued Medical Work up  Expected Discharge Plan and Services Expected Discharge Plan: [place] Choice: [place] arrangements for the past 2 months: Single Family Home                                       Social Determinants of Health (SDOH) Interventions    Readmission Risk Interventions No flowsheet data found.

## 2021-02-15 DIAGNOSIS — R279 Unspecified lack of coordination: Secondary | ICD-10-CM | POA: Diagnosis not present

## 2021-02-15 DIAGNOSIS — R0902 Hypoxemia: Secondary | ICD-10-CM | POA: Diagnosis not present

## 2021-02-15 DIAGNOSIS — M6259 Muscle wasting and atrophy, not elsewhere classified, multiple sites: Secondary | ICD-10-CM | POA: Diagnosis not present

## 2021-02-15 DIAGNOSIS — J1282 Pneumonia due to coronavirus disease 2019: Secondary | ICD-10-CM | POA: Diagnosis not present

## 2021-02-15 DIAGNOSIS — R2689 Other abnormalities of gait and mobility: Secondary | ICD-10-CM | POA: Diagnosis not present

## 2021-02-15 DIAGNOSIS — R404 Transient alteration of awareness: Secondary | ICD-10-CM | POA: Diagnosis not present

## 2021-02-15 DIAGNOSIS — Q8500 Neurofibromatosis, unspecified: Secondary | ICD-10-CM | POA: Diagnosis not present

## 2021-02-15 DIAGNOSIS — J069 Acute upper respiratory infection, unspecified: Secondary | ICD-10-CM | POA: Diagnosis not present

## 2021-02-15 DIAGNOSIS — D6489 Other specified anemias: Secondary | ICD-10-CM | POA: Diagnosis not present

## 2021-02-15 DIAGNOSIS — F0280 Dementia in other diseases classified elsewhere without behavioral disturbance: Secondary | ICD-10-CM | POA: Diagnosis not present

## 2021-02-15 DIAGNOSIS — E7849 Other hyperlipidemia: Secondary | ICD-10-CM | POA: Diagnosis not present

## 2021-02-15 DIAGNOSIS — J9601 Acute respiratory failure with hypoxia: Secondary | ICD-10-CM | POA: Diagnosis not present

## 2021-02-15 DIAGNOSIS — I10 Essential (primary) hypertension: Secondary | ICD-10-CM | POA: Diagnosis not present

## 2021-02-15 DIAGNOSIS — E782 Mixed hyperlipidemia: Secondary | ICD-10-CM | POA: Diagnosis not present

## 2021-02-15 DIAGNOSIS — M1990 Unspecified osteoarthritis, unspecified site: Secondary | ICD-10-CM | POA: Diagnosis not present

## 2021-02-15 DIAGNOSIS — M6281 Muscle weakness (generalized): Secondary | ICD-10-CM | POA: Diagnosis not present

## 2021-02-15 DIAGNOSIS — R41841 Cognitive communication deficit: Secondary | ICD-10-CM | POA: Diagnosis not present

## 2021-02-15 DIAGNOSIS — E785 Hyperlipidemia, unspecified: Secondary | ICD-10-CM | POA: Diagnosis not present

## 2021-02-15 DIAGNOSIS — L89152 Pressure ulcer of sacral region, stage 2: Secondary | ICD-10-CM | POA: Diagnosis not present

## 2021-02-15 DIAGNOSIS — U071 COVID-19: Secondary | ICD-10-CM | POA: Diagnosis not present

## 2021-02-15 DIAGNOSIS — Z1331 Encounter for screening for depression: Secondary | ICD-10-CM | POA: Diagnosis not present

## 2021-02-15 DIAGNOSIS — R2681 Unsteadiness on feet: Secondary | ICD-10-CM | POA: Diagnosis not present

## 2021-02-15 DIAGNOSIS — D649 Anemia, unspecified: Secondary | ICD-10-CM | POA: Diagnosis not present

## 2021-02-15 DIAGNOSIS — F0390 Unspecified dementia without behavioral disturbance: Secondary | ICD-10-CM | POA: Diagnosis not present

## 2021-02-15 DIAGNOSIS — W1789XA Other fall from one level to another, initial encounter: Secondary | ICD-10-CM | POA: Diagnosis not present

## 2021-02-15 DIAGNOSIS — Z7401 Bed confinement status: Secondary | ICD-10-CM | POA: Diagnosis not present

## 2021-02-15 DIAGNOSIS — R1312 Dysphagia, oropharyngeal phase: Secondary | ICD-10-CM | POA: Diagnosis not present

## 2021-02-15 LAB — CBC WITH DIFFERENTIAL/PLATELET
Abs Immature Granulocytes: 0.11 10*3/uL — ABNORMAL HIGH (ref 0.00–0.07)
Basophils Absolute: 0 10*3/uL (ref 0.0–0.1)
Basophils Relative: 0 %
Eosinophils Absolute: 0 10*3/uL (ref 0.0–0.5)
Eosinophils Relative: 0 %
HCT: 36.1 % (ref 36.0–46.0)
Hemoglobin: 11.4 g/dL — ABNORMAL LOW (ref 12.0–15.0)
Immature Granulocytes: 1 %
Lymphocytes Relative: 16 %
Lymphs Abs: 1.3 10*3/uL (ref 0.7–4.0)
MCH: 31 pg (ref 26.0–34.0)
MCHC: 31.6 g/dL (ref 30.0–36.0)
MCV: 98.1 fL (ref 80.0–100.0)
Monocytes Absolute: 0.2 10*3/uL (ref 0.1–1.0)
Monocytes Relative: 2 %
Neutro Abs: 6.8 10*3/uL (ref 1.7–7.7)
Neutrophils Relative %: 81 %
Platelets: 143 10*3/uL — ABNORMAL LOW (ref 150–400)
RBC: 3.68 MIL/uL — ABNORMAL LOW (ref 3.87–5.11)
RDW: 14.1 % (ref 11.5–15.5)
WBC: 8.4 10*3/uL (ref 4.0–10.5)
nRBC: 0 % (ref 0.0–0.2)

## 2021-02-15 LAB — COMPREHENSIVE METABOLIC PANEL
ALT: 53 U/L — ABNORMAL HIGH (ref 0–44)
AST: 74 U/L — ABNORMAL HIGH (ref 15–41)
Albumin: 2.9 g/dL — ABNORMAL LOW (ref 3.5–5.0)
Alkaline Phosphatase: 53 U/L (ref 38–126)
Anion gap: 6 (ref 5–15)
BUN: 24 mg/dL — ABNORMAL HIGH (ref 8–23)
CO2: 27 mmol/L (ref 22–32)
Calcium: 8.9 mg/dL (ref 8.9–10.3)
Chloride: 104 mmol/L (ref 98–111)
Creatinine, Ser: 0.83 mg/dL (ref 0.44–1.00)
GFR, Estimated: >60 mL/min (ref >60)
Glucose, Bld: 149 mg/dL — ABNORMAL HIGH (ref 70–99)
Potassium: 3.9 mmol/L (ref 3.5–5.1)
Sodium: 137 mmol/L (ref 135–145)
Total Bilirubin: 0.4 mg/dL (ref 0.3–1.2)
Total Protein: 5.6 g/dL — ABNORMAL LOW (ref 6.5–8.1)

## 2021-02-15 LAB — D-DIMER, QUANTITATIVE: D-Dimer, Quant: 0.82 ug/mL-FEU — ABNORMAL HIGH (ref 0.00–0.50)

## 2021-02-15 LAB — MAGNESIUM: Magnesium: 1.9 mg/dL (ref 1.7–2.4)

## 2021-02-15 LAB — FERRITIN: Ferritin: 100 ng/mL (ref 11–307)

## 2021-02-15 LAB — PHOSPHORUS: Phosphorus: 2.5 mg/dL (ref 2.5–4.6)

## 2021-02-15 LAB — C-REACTIVE PROTEIN: CRP: 0.9 mg/dL (ref <1.0)

## 2021-02-15 LAB — GLUCOSE, CAPILLARY
Glucose-Capillary: 144 mg/dL — ABNORMAL HIGH (ref 70–99)
Glucose-Capillary: 199 mg/dL — ABNORMAL HIGH (ref 70–99)
Glucose-Capillary: 108 mg/dL — ABNORMAL HIGH (ref 70–99)

## 2021-02-15 MED ORDER — POLYETHYLENE GLYCOL 3350 17 G PO PACK: 17.0000 g | PACK | Freq: Every day | ORAL | 2 refills | Status: AC

## 2021-02-15 MED ORDER — ZINC SULFATE 220 (50 ZN) MG PO CAPS: 220.0000 mg | ORAL_CAPSULE | Freq: Every day | ORAL | 1 refills | Status: DC

## 2021-02-15 MED ORDER — ASCORBIC ACID 500 MG PO TABS: 500.0000 mg | ORAL_TABLET | Freq: Every day | ORAL | 3 refills | Status: DC

## 2021-02-15 MED ORDER — ALBUTEROL SULFATE HFA 108 (90 BASE) MCG/ACT IN AERS: 2.0000 | INHALATION_SPRAY | Freq: Two times a day (BID) | RESPIRATORY_TRACT | 1 refills | Status: DC

## 2021-02-15 MED ORDER — CALCIUM CARBONATE-VITAMIN D 500-200 MG-UNIT PO TABS: 1.0000 | ORAL_TABLET | Freq: Every day | ORAL | 2 refills | Status: DC

## 2021-02-15 MED ORDER — AMLODIPINE BESYLATE 2.5 MG PO TABS
2.5000 mg | ORAL_TABLET | Freq: Every day | ORAL | 0 refills | Status: DC
Start: 2021-02-15 — End: 2022-01-30

## 2021-02-15 MED ORDER — DEXAMETHASONE 6 MG PO TABS: 6.0000 mg | ORAL_TABLET | Freq: Every day | ORAL | 0 refills | Status: DC

## 2021-02-15 NOTE — Care Management Important Message (Signed)
Important Message  Patient Details  Name: Katrina Berry MRN: [id number] Date of Birth: July 04, 1918   Medicare Important Message Given:  Yes - Important Message mailed due to current National Emergency     Tommy Medal 02/15/2021, 1:35 PM

## 2021-02-15 NOTE — Care Management Important Message (Signed)
Important Message  Patient Details  Name: Katrina Berry MRN: [id number] Date of Birth: [birth year]   Medicare Important Message Given:  Yes - Important Message mailed due to current National Emergency     Tommy Medal 02/15/2021, 1:41 PM

## 2021-02-15 NOTE — Progress Notes (Signed)
Attempted to call report to [facility], nurse did not answer.

## 2021-02-15 NOTE — Discharge Summary (Signed)
Katrina Berry, is a 87 y.o. female  DOB Jul 06, 1918  MRN [id number].  Admission date:  02/11/2021  Admitting Physician  Roxan Hockey, MD  Discharge Date:  02/15/2021   Primary MD  Fayrene Helper, MD  Recommendations for primary care physician for things to follow:   1) take medications as prescribed 2) encourage nutritional supplements including Ensure or boost  Admission Diagnosis  Pneumonia due to COVID-19 virus [U07.1, J12.82] COVID-19 [U07.1]   Discharge Diagnosis  Pneumonia due to COVID-19 virus [U07.1, J12.82] COVID-19 [U07.1]    Principal Problem:   Pneumonia due to COVID-19 virus Active Problems:   Acute respiratory disease due to COVID-19 virus   Essential hypertension   Dementia ([place])   Hyperlipidemia LDL goal <130   Pressure injury of skin      Past Medical History:  Diagnosis Date   Allergic rhinitis 11/25/2016   Arthritis    mild, no known falls uses cane   Dementia ([place])    Hypertension    Neurofibroma 05/29/2017   Right breast skin lesion- pathology neurofibroma     Past Surgical History:  Procedure Laterality Date   MASS EXCISION Right 06/07/2017   Procedure: EXCISION SKIN LESION OF RIGHT BREAST;  Surgeon: Virl Cagey, MD;  Location: [facility];  Service: General;  Laterality: Right;   NO PAST SURGERIES       HPI  from the history and physical done on the day of admission:      Katrina Berry  is a 87 y.o. female with past medical history relevant for dementia, HLD, HTN who is vaccinated against COVID-19 infection but not boosted presents to the ED with fatigue, weakness, inability to perform ADLs, poor oral intake cough and shortness of breath -- Patient apparently has had persistent fevers and chills at home, myalgias malaise-- -patient is 87 years old, primary caregiver is a niece who is over 39 years old and currently COVID-positive and no longer  able to care for patient at this time -- Unsafe discharge plan at this time   -Chest x-ray consistent with COVID-pneumonia COVID-19 Labs  -Lactic acid is 0.8 -CBC with hemoglobin of 11.5, WBC 6.1  -Creatinine 0.83, potassium 3.6, sodium 139, bicarb 26 -Glucose is 79, LFTs are not elevated PCT < 0.10 -No vomiting or diarrhea   Hospital Course:    Assessment & Plan:   1)Pneumonia due to COVID-19 with Hypoxia -Weaned off oxygen, no further hypoxia O2 sats 99 200% on room air -Continue vitamin C and zinc -Continue bronchodilators and antitussive medications. -CRP and ferritin trending down Completed 5 days of remdesivir IV, okay to discharge on p.o. Decadron COVID-19 Labs  Recent Labs    02/13/21 0640 02/14/21 0612 02/14/21 0618 02/15/21 0547  DDIMER 0.85* 0.69*  --  0.82*  FERRITIN 103  --  118 100  CRP 3.5*  --  1.5* 0.9    Lab Results  Component Value Date   SARSCOV2NAA POSITIVE (A) 02/11/2021     2)HTN--- -Overall stable Stop losartan, use amlodipine  2.5 mg twice daily   3-history of dementia Baseline cognitive and memory deficits -Continue nightly Aricept.   4-HLD -Continue Lipitor.   5-Pressure injury of skin -Stage II sacral and pressure injury -Continue skin barriers and constant repositioning. -Patient's pressure injury was present at time of admission.   6)Disposition/Social/Ethics--patient is 87 years old, primary caregiver is a niece who is over 44 years old and currently COVID-positive and no longer able to care for patient at this time Family okay with SNF placement at this time  Discharge Condition: stable  Follow UP   Contact information for after-discharge care     Destination     [facility] Preferred SNF .   Service: Skilled Nursing Contact information: [address] [place] [phone]                      Consults obtained - na  Diet and Activity recommendation:  As  advised  Discharge Instructions    Discharge Instructions     Call MD for:  difficulty breathing, headache or visual disturbances   Complete by: As directed    Call MD for:  persistant dizziness or light-headedness   Complete by: As directed    Call MD for:  persistant nausea and vomiting   Complete by: As directed    Call MD for:  severe uncontrolled pain   Complete by: As directed    Call MD for:  temperature >100.4   Complete by: As directed    Diet - low sodium heart healthy   Complete by: As directed    Discharge instructions   Complete by: As directed    1) take medications as prescribed 2) encourage nutritional supplements including Ensure or boost   Discharge wound care:   Complete by: As directed    As above   Increase activity slowly   Complete by: As directed        Discharge Medications     Allergies as of 02/15/2021   No Known Allergies      Medication List     STOP taking these medications    Calcium Carb-Cholecalciferol 600-800 MG-UNIT Tabs   losartan 100 MG tablet Commonly known as: COZAAR       TAKE these medications    acetaminophen 500 MG tablet Commonly known as: TYLENOL Take 500 mg by mouth every 6 (six) hours as needed for mild pain or headache.   albuterol 108 (90 Base) MCG/ACT inhaler Commonly known as: VENTOLIN HFA Inhale 2 puffs into the lungs 2 (two) times daily.   amLODipine 2.5 MG tablet Commonly known as: NORVASC Take 1 tablet (2.5 mg total) by mouth daily.   ascorbic acid 500 MG tablet Commonly known as: VITAMIN C Take 1 tablet (500 mg total) by mouth daily. Start taking on: February 16, 2021   aspirin EC 81 MG tablet Take 1 tablet (81 mg total) by mouth daily.   atorvastatin 10 MG tablet Commonly known as: LIPITOR TAKE 1 TABLET BY MOUTH EVERY DAY   calcium-vitamin D 500-200 MG-UNIT tablet Commonly known as: OSCAL WITH D Take 1 tablet by mouth daily with breakfast. Start taking on: February 16, 2021    dexamethasone 6 MG tablet Commonly known as: DECADRON Take 1 tablet (6 mg total) by mouth daily.   multivitamin with minerals Tabs tablet Take 1 tablet by mouth daily.   nystatin powder Commonly known as: MYCOSTATIN/NYSTOP APPLY 1 APPLICATION TOPICALLY 3 (THREE) TIMES DAILY.   polyethylene  glycol 17 g packet Commonly known as: MIRALAX / GLYCOLAX Take 17 g by mouth daily.   zinc sulfate 220 (50 Zn) MG capsule Take 1 capsule (220 mg total) by mouth daily. Start taking on: February 16, 2021       ASK your doctor about these medications    donepezil 10 MG tablet Commonly known as: ARICEPT TAKE 1 TABLET BY MOUTH EVERYDAY AT BEDTIME               Discharge Care Instructions  (From admission, onward)           Start     Ordered   02/15/21 0000  Discharge wound care:       Comments: As above   02/15/21 1253            Major procedures and Radiology Reports - PLEASE review detailed and final reports for all details, in brief -   DG Chest Portable 1 View  Result Date: 02/11/2021 CLINICAL DATA:  Cough, COVID positive. EXAM: PORTABLE CHEST 1 VIEW COMPARISON:  CT chest abdomen pelvis 11/16/2016 FINDINGS: The heart size and mediastinal contours are within normal limits. Bibasilar streaky airspace opacity. No pulmonary edema. No pleural effusion. No pneumothorax. No acute osseous abnormality. IMPRESSION: Bibasilar streaky airspace opacity that could represent a combination of atelectasis and infection/inflammation. Electronically Signed   By: Iven Finn M.D.   On: 02/11/2021 20:42    Micro Results   Recent Results (from the past 240 hour(s))  Resp Panel by RT-PCR (Flu A&B, Covid) Nasopharyngeal Swab     Status: Abnormal   Collection Time: 02/11/21  7:11 PM   Specimen: Nasopharyngeal Swab; Nasopharyngeal(NP) swabs in vial transport medium  Result Value Ref Range Status   SARS Coronavirus 2 by RT PCR POSITIVE (A) NEGATIVE Final    Comment: RESULT CALLED TO, READ  BACK BY AND VERIFIED WITH: PRUITT,G @ 2051 ON 02/11/21 BY JUW (NOTE) SARS-CoV-2 target nucleic acids are DETECTED.  The SARS-CoV-2 RNA is generally detectable in upper respiratory specimens during the acute phase of infection. Positive results are indicative of the presence of the identified virus, but do not rule out bacterial infection or co-infection with other pathogens not detected by the test. Clinical correlation with patient history and other diagnostic information is necessary to determine patient infection status. The expected result is Negative.  Fact Sheet for Patients: EntrepreneurPulse.com.au  Fact Sheet for Healthcare Providers: IncredibleEmployment.be  This test is not yet approved or cleared by the [place] FDA and  has been authorized for detection and/or diagnosis of SARS-CoV-2 by FDA under an Emergency Use Authorization (EUA).  This EUA will remain in effect (meaning this test can be  used) for the duration of  the COVID-19 declaration under Section 564(b)(1) of the Act, 21 U.S.C. section 360bbb-3(b)(1), unless the authorization is terminated or revoked sooner.     Influenza A by PCR NEGATIVE NEGATIVE Final   Influenza B by PCR NEGATIVE NEGATIVE Final    Comment: (NOTE) The Xpert Xpress SARS-CoV-2/FLU/RSV plus assay is intended as an aid in the diagnosis of influenza from Nasopharyngeal swab specimens and should not be used as a sole basis for treatment. Nasal washings and aspirates are unacceptable for Xpert Xpress SARS-CoV-2/FLU/RSV testing.  Fact Sheet for Patients: EntrepreneurPulse.com.au  Fact Sheet for Healthcare Providers: IncredibleEmployment.be  This test is not yet approved or cleared by the [place] FDA and has been authorized for detection and/or diagnosis of SARS-CoV-2 by FDA under an Emergency Use Authorization (  EUA). This EUA will remain in effect (meaning  this test can be used) for the duration of the COVID-19 declaration under Section 564(b)(1) of the Act, 21 U.S.C. section 360bbb-3(b)(1), unless the authorization is terminated or revoked.  Performed at [facility], [address] [place], [place] [address]        Today   Subjective    Katrina Berry today has no new complaints -Eating and drinking fair -No hypoxia No productive cough no fevers          Patient has been seen and examined prior to discharge   Objective   Blood pressure (!) 142/54, pulse (!) 41, temperature 98.2 F (36.8 C), resp. rate 19, height 5\' 2"  (1.575 m), weight 66.3 kg, SpO2 100 %.   Intake/Output Summary (Last 24 hours) at 02/15/2021 1308 Last data filed at 02/15/2021 1100 Gross per 24 hour  Intake 1958 ml  Output 500 ml  Net 1458 ml    Exam Physical Examination: General appearance - alert, elderly  Mental status -underlying/baseline cognitive and memory deficits Eyes - sclera anicteric Neck - supple, no JVD elevation , Chest -diminished breath sounds with bibasilar rhonchi heart - S1 and S2 normal, regular Abdomen - soft, nontender, nondistended, no CVA tenderness  Neurological -generalized weakness without new acute focal deficit Extremities - no pedal edema noted, intact peripheral pulses Skin - warm, dry   Data Review   CBC w Diff:  Lab Results  Component Value Date   WBC 8.4 02/15/2021   HGB 11.4 (L) 02/15/2021   HCT 36.1 02/15/2021   PLT 143 (L) 02/15/2021   LYMPHOPCT 16 02/15/2021   MONOPCT 2 02/15/2021   EOSPCT 0 02/15/2021   BASOPCT 0 02/15/2021    CMP:  Lab Results  Component Value Date   NA 137 02/15/2021   K 3.9 02/15/2021   CL 104 02/15/2021   CO2 27 02/15/2021   BUN 24 (H) 02/15/2021   CREATININE 0.83 02/15/2021   CREATININE 0.97 (H) 09/04/2019   PROT 5.6 (L) 02/15/2021   ALBUMIN 2.9 (L) 02/15/2021   BILITOT 0.4 02/15/2021   ALKPHOS 53 02/15/2021   AST 74 (H) 02/15/2021   ALT 53 (H) 02/15/2021   .   Total Discharge time is about 33 minutes  Roxan Hockey M.D on 02/15/2021 at 1:08 PM  Go to [email or web address] -  for contact info  [facility] - Office  [phone]

## 2021-02-15 NOTE — TOC Transition Note (Signed)
Transition of Care [facility]) - CM/SW Discharge Note   Patient Details  Name: Katrina Berry MRN: [id number] Date of Birth: 12-17-1917  [facility] CM/SW Contact:  Ihor Gully, LCSW Phone Number: 02/15/2021, 2:55 PM   Clinical Narrative:    Discharge clinicals sent to facility. RN to call report. TOC signing off.    Final next level of care: Skilled Nursing Facility Barriers to Discharge: No Barriers Identified   Patient Goals and CMS Choice Patient states their goals for this hospitalization and ongoing recovery are:: rehab then home      Discharge Placement              Patient chooses bed at: [facility] Patient to be transferred to facility by: [facility] Name of family member notified: sister, [name] Patient and family notified of of transfer: 02/15/21  Discharge Plan and Services     Post Acute Care Choice: [facility]                               Social Determinants of Health (SDOH) Interventions     Readmission Risk Interventions No flowsheet data found.

## 2021-02-15 NOTE — Discharge Instructions (Signed)
1)Take medications as prescribed 2)Encourage nutritional supplements including Ensure or boost

## 2021-02-16 DIAGNOSIS — I10 Essential (primary) hypertension: Secondary | ICD-10-CM | POA: Diagnosis not present

## 2021-02-16 DIAGNOSIS — J1282 Pneumonia due to coronavirus disease 2019: Secondary | ICD-10-CM | POA: Diagnosis not present

## 2021-02-16 DIAGNOSIS — R2681 Unsteadiness on feet: Secondary | ICD-10-CM | POA: Diagnosis not present

## 2021-02-16 DIAGNOSIS — M1990 Unspecified osteoarthritis, unspecified site: Secondary | ICD-10-CM | POA: Diagnosis not present

## 2021-02-16 DIAGNOSIS — F0390 Unspecified dementia without behavioral disturbance: Secondary | ICD-10-CM | POA: Diagnosis not present

## 2021-02-16 DIAGNOSIS — U071 COVID-19: Secondary | ICD-10-CM | POA: Diagnosis not present

## 2021-02-16 DIAGNOSIS — E782 Mixed hyperlipidemia: Secondary | ICD-10-CM | POA: Diagnosis not present

## 2021-02-16 DIAGNOSIS — M6281 Muscle weakness (generalized): Secondary | ICD-10-CM | POA: Diagnosis not present

## 2021-02-16 DIAGNOSIS — Q8500 Neurofibromatosis, unspecified: Secondary | ICD-10-CM | POA: Diagnosis not present

## 2021-02-16 DIAGNOSIS — E785 Hyperlipidemia, unspecified: Secondary | ICD-10-CM | POA: Diagnosis not present

## 2021-02-20 DIAGNOSIS — I10 Essential (primary) hypertension: Secondary | ICD-10-CM | POA: Diagnosis not present

## 2021-02-20 DIAGNOSIS — M1990 Unspecified osteoarthritis, unspecified site: Secondary | ICD-10-CM | POA: Diagnosis not present

## 2021-02-20 DIAGNOSIS — R2681 Unsteadiness on feet: Secondary | ICD-10-CM | POA: Diagnosis not present

## 2021-02-20 DIAGNOSIS — F0390 Unspecified dementia without behavioral disturbance: Secondary | ICD-10-CM | POA: Diagnosis not present

## 2021-02-20 DIAGNOSIS — E782 Mixed hyperlipidemia: Secondary | ICD-10-CM | POA: Diagnosis not present

## 2021-02-20 DIAGNOSIS — Q8500 Neurofibromatosis, unspecified: Secondary | ICD-10-CM | POA: Diagnosis not present

## 2021-02-20 DIAGNOSIS — M6281 Muscle weakness (generalized): Secondary | ICD-10-CM | POA: Diagnosis not present

## 2021-02-20 DIAGNOSIS — U071 COVID-19: Secondary | ICD-10-CM | POA: Diagnosis not present

## 2021-02-21 DIAGNOSIS — U071 COVID-19: Secondary | ICD-10-CM | POA: Diagnosis not present

## 2021-02-21 DIAGNOSIS — J9601 Acute respiratory failure with hypoxia: Secondary | ICD-10-CM | POA: Diagnosis not present

## 2021-02-21 DIAGNOSIS — L89152 Pressure ulcer of sacral region, stage 2: Secondary | ICD-10-CM | POA: Diagnosis not present

## 2021-02-21 DIAGNOSIS — J1282 Pneumonia due to coronavirus disease 2019: Secondary | ICD-10-CM | POA: Diagnosis not present

## 2021-02-21 DIAGNOSIS — F0390 Unspecified dementia without behavioral disturbance: Secondary | ICD-10-CM | POA: Diagnosis not present

## 2021-02-21 DIAGNOSIS — D649 Anemia, unspecified: Secondary | ICD-10-CM | POA: Diagnosis not present

## 2021-02-21 DIAGNOSIS — I10 Essential (primary) hypertension: Secondary | ICD-10-CM | POA: Diagnosis not present

## 2021-02-28 DIAGNOSIS — F0390 Unspecified dementia without behavioral disturbance: Secondary | ICD-10-CM | POA: Diagnosis not present

## 2021-02-28 DIAGNOSIS — E782 Mixed hyperlipidemia: Secondary | ICD-10-CM | POA: Diagnosis not present

## 2021-02-28 DIAGNOSIS — R2681 Unsteadiness on feet: Secondary | ICD-10-CM | POA: Diagnosis not present

## 2021-02-28 DIAGNOSIS — I10 Essential (primary) hypertension: Secondary | ICD-10-CM | POA: Diagnosis not present

## 2021-02-28 DIAGNOSIS — U071 COVID-19: Secondary | ICD-10-CM | POA: Diagnosis not present

## 2021-02-28 DIAGNOSIS — Q8500 Neurofibromatosis, unspecified: Secondary | ICD-10-CM | POA: Diagnosis not present

## 2021-02-28 DIAGNOSIS — M6281 Muscle weakness (generalized): Secondary | ICD-10-CM | POA: Diagnosis not present

## 2021-02-28 DIAGNOSIS — M1990 Unspecified osteoarthritis, unspecified site: Secondary | ICD-10-CM | POA: Diagnosis not present

## 2021-03-02 DIAGNOSIS — U071 COVID-19: Secondary | ICD-10-CM | POA: Diagnosis not present

## 2021-03-02 DIAGNOSIS — M1990 Unspecified osteoarthritis, unspecified site: Secondary | ICD-10-CM | POA: Diagnosis not present

## 2021-03-02 DIAGNOSIS — E782 Mixed hyperlipidemia: Secondary | ICD-10-CM | POA: Diagnosis not present

## 2021-03-02 DIAGNOSIS — Q8500 Neurofibromatosis, unspecified: Secondary | ICD-10-CM | POA: Diagnosis not present

## 2021-03-02 DIAGNOSIS — F0390 Unspecified dementia without behavioral disturbance: Secondary | ICD-10-CM | POA: Diagnosis not present

## 2021-03-02 DIAGNOSIS — M6281 Muscle weakness (generalized): Secondary | ICD-10-CM | POA: Diagnosis not present

## 2021-03-02 DIAGNOSIS — R2681 Unsteadiness on feet: Secondary | ICD-10-CM | POA: Diagnosis not present

## 2021-03-02 DIAGNOSIS — I10 Essential (primary) hypertension: Secondary | ICD-10-CM | POA: Diagnosis not present

## 2021-03-06 DIAGNOSIS — R2681 Unsteadiness on feet: Secondary | ICD-10-CM | POA: Diagnosis not present

## 2021-03-06 DIAGNOSIS — M1990 Unspecified osteoarthritis, unspecified site: Secondary | ICD-10-CM | POA: Diagnosis not present

## 2021-03-06 DIAGNOSIS — Q8500 Neurofibromatosis, unspecified: Secondary | ICD-10-CM | POA: Diagnosis not present

## 2021-03-06 DIAGNOSIS — F0390 Unspecified dementia without behavioral disturbance: Secondary | ICD-10-CM | POA: Diagnosis not present

## 2021-03-06 DIAGNOSIS — E782 Mixed hyperlipidemia: Secondary | ICD-10-CM | POA: Diagnosis not present

## 2021-03-06 DIAGNOSIS — I10 Essential (primary) hypertension: Secondary | ICD-10-CM | POA: Diagnosis not present

## 2021-03-06 DIAGNOSIS — U071 COVID-19: Secondary | ICD-10-CM | POA: Diagnosis not present

## 2021-03-06 DIAGNOSIS — M6281 Muscle weakness (generalized): Secondary | ICD-10-CM | POA: Diagnosis not present

## 2021-03-08 ENCOUNTER — Ambulatory Visit: Payer: Medicare Other | Admitting: Family Medicine

## 2021-03-08 ENCOUNTER — Other Ambulatory Visit: Payer: Self-pay | Admitting: *Deleted

## 2021-03-08 DIAGNOSIS — Z1331 Encounter for screening for depression: Secondary | ICD-10-CM | POA: Diagnosis not present

## 2021-03-08 DIAGNOSIS — U071 COVID-19: Secondary | ICD-10-CM | POA: Diagnosis not present

## 2021-03-08 DIAGNOSIS — W1789XA Other fall from one level to another, initial encounter: Secondary | ICD-10-CM | POA: Diagnosis not present

## 2021-03-08 DIAGNOSIS — I10 Essential (primary) hypertension: Secondary | ICD-10-CM | POA: Diagnosis not present

## 2021-03-08 DIAGNOSIS — F0280 Dementia in other diseases classified elsewhere without behavioral disturbance: Secondary | ICD-10-CM | POA: Diagnosis not present

## 2021-03-08 DIAGNOSIS — J1282 Pneumonia due to coronavirus disease 2019: Secondary | ICD-10-CM | POA: Diagnosis not present

## 2021-03-08 NOTE — Patient Outreach (Signed)
Member screened for potential [facility] Care Management needs. Mrs. Abella resides in [facility].  Update received from [facility] SW reporting member's transition plan is for LTC.   No identifiable [facility] Care Management needs at this time.    Marthenia Rolling, MSN, RN,BSN [employer] Acute Care Coordinator [phone] [facility]) [phone]  (Toll free office)

## 2021-03-09 DIAGNOSIS — M1990 Unspecified osteoarthritis, unspecified site: Secondary | ICD-10-CM | POA: Diagnosis not present

## 2021-03-09 DIAGNOSIS — M6281 Muscle weakness (generalized): Secondary | ICD-10-CM | POA: Diagnosis not present

## 2021-03-09 DIAGNOSIS — Q8500 Neurofibromatosis, unspecified: Secondary | ICD-10-CM | POA: Diagnosis not present

## 2021-03-09 DIAGNOSIS — E782 Mixed hyperlipidemia: Secondary | ICD-10-CM | POA: Diagnosis not present

## 2021-03-09 DIAGNOSIS — R2681 Unsteadiness on feet: Secondary | ICD-10-CM | POA: Diagnosis not present

## 2021-03-09 DIAGNOSIS — F0390 Unspecified dementia without behavioral disturbance: Secondary | ICD-10-CM | POA: Diagnosis not present

## 2021-03-09 DIAGNOSIS — U071 COVID-19: Secondary | ICD-10-CM | POA: Diagnosis not present

## 2021-03-09 DIAGNOSIS — I10 Essential (primary) hypertension: Secondary | ICD-10-CM | POA: Diagnosis not present

## 2021-03-13 DIAGNOSIS — M6281 Muscle weakness (generalized): Secondary | ICD-10-CM | POA: Diagnosis not present

## 2021-03-13 DIAGNOSIS — R2681 Unsteadiness on feet: Secondary | ICD-10-CM | POA: Diagnosis not present

## 2021-03-13 DIAGNOSIS — Q8500 Neurofibromatosis, unspecified: Secondary | ICD-10-CM | POA: Diagnosis not present

## 2021-03-13 DIAGNOSIS — I10 Essential (primary) hypertension: Secondary | ICD-10-CM | POA: Diagnosis not present

## 2021-03-13 DIAGNOSIS — E782 Mixed hyperlipidemia: Secondary | ICD-10-CM | POA: Diagnosis not present

## 2021-03-13 DIAGNOSIS — F0390 Unspecified dementia without behavioral disturbance: Secondary | ICD-10-CM | POA: Diagnosis not present

## 2021-03-13 DIAGNOSIS — U071 COVID-19: Secondary | ICD-10-CM | POA: Diagnosis not present

## 2021-03-13 DIAGNOSIS — M1990 Unspecified osteoarthritis, unspecified site: Secondary | ICD-10-CM | POA: Diagnosis not present

## 2021-03-14 DIAGNOSIS — D649 Anemia, unspecified: Secondary | ICD-10-CM | POA: Diagnosis not present

## 2021-03-14 DIAGNOSIS — U071 COVID-19: Secondary | ICD-10-CM | POA: Diagnosis not present

## 2021-03-14 DIAGNOSIS — E785 Hyperlipidemia, unspecified: Secondary | ICD-10-CM | POA: Diagnosis not present

## 2021-03-14 DIAGNOSIS — F0390 Unspecified dementia without behavioral disturbance: Secondary | ICD-10-CM | POA: Diagnosis not present

## 2021-03-14 DIAGNOSIS — I10 Essential (primary) hypertension: Secondary | ICD-10-CM | POA: Diagnosis not present

## 2021-03-14 DIAGNOSIS — J1282 Pneumonia due to coronavirus disease 2019: Secondary | ICD-10-CM | POA: Diagnosis not present

## 2021-03-16 DIAGNOSIS — U071 COVID-19: Secondary | ICD-10-CM | POA: Diagnosis not present

## 2021-03-16 DIAGNOSIS — M6281 Muscle weakness (generalized): Secondary | ICD-10-CM | POA: Diagnosis not present

## 2021-03-16 DIAGNOSIS — J9601 Acute respiratory failure with hypoxia: Secondary | ICD-10-CM | POA: Diagnosis not present

## 2021-03-16 DIAGNOSIS — M1990 Unspecified osteoarthritis, unspecified site: Secondary | ICD-10-CM | POA: Diagnosis not present

## 2021-03-16 DIAGNOSIS — D6489 Other specified anemias: Secondary | ICD-10-CM | POA: Diagnosis not present

## 2021-03-16 DIAGNOSIS — R2681 Unsteadiness on feet: Secondary | ICD-10-CM | POA: Diagnosis not present

## 2021-03-16 DIAGNOSIS — Q8500 Neurofibromatosis, unspecified: Secondary | ICD-10-CM | POA: Diagnosis not present

## 2021-03-16 DIAGNOSIS — J1282 Pneumonia due to coronavirus disease 2019: Secondary | ICD-10-CM | POA: Diagnosis not present

## 2021-03-16 DIAGNOSIS — E782 Mixed hyperlipidemia: Secondary | ICD-10-CM | POA: Diagnosis not present

## 2021-03-16 DIAGNOSIS — I10 Essential (primary) hypertension: Secondary | ICD-10-CM | POA: Diagnosis not present

## 2021-03-16 DIAGNOSIS — F0280 Dementia in other diseases classified elsewhere without behavioral disturbance: Secondary | ICD-10-CM | POA: Diagnosis not present

## 2021-03-16 DIAGNOSIS — E7849 Other hyperlipidemia: Secondary | ICD-10-CM | POA: Diagnosis not present

## 2021-03-16 DIAGNOSIS — F0390 Unspecified dementia without behavioral disturbance: Secondary | ICD-10-CM | POA: Diagnosis not present

## 2021-03-17 DIAGNOSIS — R2689 Other abnormalities of gait and mobility: Secondary | ICD-10-CM | POA: Diagnosis not present

## 2021-03-17 DIAGNOSIS — J9621 Acute and chronic respiratory failure with hypoxia: Secondary | ICD-10-CM | POA: Diagnosis not present

## 2021-03-17 DIAGNOSIS — R1312 Dysphagia, oropharyngeal phase: Secondary | ICD-10-CM | POA: Diagnosis not present

## 2021-03-17 DIAGNOSIS — M6258 Muscle wasting and atrophy, not elsewhere classified, other site: Secondary | ICD-10-CM | POA: Diagnosis not present

## 2021-03-17 DIAGNOSIS — M6281 Muscle weakness (generalized): Secondary | ICD-10-CM | POA: Diagnosis not present

## 2021-03-17 DIAGNOSIS — I739 Peripheral vascular disease, unspecified: Secondary | ICD-10-CM | POA: Diagnosis not present

## 2021-03-17 DIAGNOSIS — M6259 Muscle wasting and atrophy, not elsewhere classified, multiple sites: Secondary | ICD-10-CM | POA: Diagnosis not present

## 2021-03-17 DIAGNOSIS — Z79899 Other long term (current) drug therapy: Secondary | ICD-10-CM | POA: Diagnosis not present

## 2021-03-17 DIAGNOSIS — R278 Other lack of coordination: Secondary | ICD-10-CM | POA: Diagnosis not present

## 2021-03-17 DIAGNOSIS — E559 Vitamin D deficiency, unspecified: Secondary | ICD-10-CM | POA: Diagnosis not present

## 2021-03-17 DIAGNOSIS — R5381 Other malaise: Secondary | ICD-10-CM | POA: Diagnosis not present

## 2021-03-17 DIAGNOSIS — Z8616 Personal history of COVID-19: Secondary | ICD-10-CM | POA: Diagnosis not present

## 2021-03-17 DIAGNOSIS — E785 Hyperlipidemia, unspecified: Secondary | ICD-10-CM | POA: Diagnosis not present

## 2021-03-17 DIAGNOSIS — B351 Tinea unguium: Secondary | ICD-10-CM | POA: Diagnosis not present

## 2021-03-17 DIAGNOSIS — I10 Essential (primary) hypertension: Secondary | ICD-10-CM | POA: Diagnosis not present

## 2021-03-17 DIAGNOSIS — F0280 Dementia in other diseases classified elsewhere without behavioral disturbance: Secondary | ICD-10-CM | POA: Diagnosis not present

## 2021-03-20 DIAGNOSIS — E785 Hyperlipidemia, unspecified: Secondary | ICD-10-CM | POA: Diagnosis not present

## 2021-03-20 DIAGNOSIS — I10 Essential (primary) hypertension: Secondary | ICD-10-CM | POA: Diagnosis not present

## 2021-03-20 DIAGNOSIS — F0280 Dementia in other diseases classified elsewhere without behavioral disturbance: Secondary | ICD-10-CM | POA: Diagnosis not present

## 2021-03-21 DIAGNOSIS — R5381 Other malaise: Secondary | ICD-10-CM | POA: Diagnosis not present

## 2021-04-01 ENCOUNTER — Other Ambulatory Visit: Payer: Self-pay | Admitting: Family Medicine

## 2021-04-17 DIAGNOSIS — I10 Essential (primary) hypertension: Secondary | ICD-10-CM | POA: Diagnosis not present

## 2021-04-17 DIAGNOSIS — F0280 Dementia in other diseases classified elsewhere without behavioral disturbance: Secondary | ICD-10-CM | POA: Diagnosis not present

## 2021-04-17 DIAGNOSIS — Z79899 Other long term (current) drug therapy: Secondary | ICD-10-CM | POA: Diagnosis not present

## 2021-04-17 DIAGNOSIS — E785 Hyperlipidemia, unspecified: Secondary | ICD-10-CM | POA: Diagnosis not present

## 2021-04-17 DIAGNOSIS — R5381 Other malaise: Secondary | ICD-10-CM | POA: Diagnosis not present

## 2021-04-28 ENCOUNTER — Other Ambulatory Visit: Payer: Self-pay | Admitting: Family Medicine

## 2021-05-16 DIAGNOSIS — I739 Peripheral vascular disease, unspecified: Secondary | ICD-10-CM | POA: Diagnosis not present

## 2021-05-16 DIAGNOSIS — B351 Tinea unguium: Secondary | ICD-10-CM | POA: Diagnosis not present

## 2021-05-18 ENCOUNTER — Other Ambulatory Visit: Payer: Self-pay | Admitting: Family Medicine

## 2021-05-19 DIAGNOSIS — E559 Vitamin D deficiency, unspecified: Secondary | ICD-10-CM | POA: Diagnosis not present

## 2021-05-19 DIAGNOSIS — I10 Essential (primary) hypertension: Secondary | ICD-10-CM | POA: Diagnosis not present

## 2021-05-19 DIAGNOSIS — E785 Hyperlipidemia, unspecified: Secondary | ICD-10-CM | POA: Diagnosis not present

## 2021-06-04 ENCOUNTER — Other Ambulatory Visit: Payer: Self-pay | Admitting: Family Medicine

## 2021-06-15 DIAGNOSIS — I10 Essential (primary) hypertension: Secondary | ICD-10-CM | POA: Diagnosis not present

## 2021-06-15 DIAGNOSIS — J9621 Acute and chronic respiratory failure with hypoxia: Secondary | ICD-10-CM | POA: Diagnosis not present

## 2021-06-15 DIAGNOSIS — E559 Vitamin D deficiency, unspecified: Secondary | ICD-10-CM | POA: Diagnosis not present

## 2021-06-15 DIAGNOSIS — F0280 Dementia in other diseases classified elsewhere without behavioral disturbance: Secondary | ICD-10-CM | POA: Diagnosis not present

## 2021-06-15 DIAGNOSIS — E785 Hyperlipidemia, unspecified: Secondary | ICD-10-CM | POA: Diagnosis not present

## 2021-06-22 DIAGNOSIS — M6281 Muscle weakness (generalized): Secondary | ICD-10-CM | POA: Diagnosis not present

## 2021-06-22 DIAGNOSIS — R4702 Dysphasia: Secondary | ICD-10-CM | POA: Diagnosis not present

## 2021-06-22 DIAGNOSIS — E785 Hyperlipidemia, unspecified: Secondary | ICD-10-CM | POA: Diagnosis not present

## 2021-06-22 DIAGNOSIS — J9621 Acute and chronic respiratory failure with hypoxia: Secondary | ICD-10-CM | POA: Diagnosis not present

## 2021-06-22 DIAGNOSIS — I10 Essential (primary) hypertension: Secondary | ICD-10-CM | POA: Diagnosis not present

## 2021-06-22 DIAGNOSIS — R2689 Other abnormalities of gait and mobility: Secondary | ICD-10-CM | POA: Diagnosis not present

## 2021-06-22 DIAGNOSIS — Z8616 Personal history of COVID-19: Secondary | ICD-10-CM | POA: Diagnosis not present

## 2021-06-22 DIAGNOSIS — G311 Senile degeneration of brain, not elsewhere classified: Secondary | ICD-10-CM | POA: Diagnosis not present

## 2021-06-22 DIAGNOSIS — R278 Other lack of coordination: Secondary | ICD-10-CM | POA: Diagnosis not present

## 2021-06-22 DIAGNOSIS — F03C0 Unspecified dementia, severe, without behavioral disturbance, psychotic disturbance, mood disturbance, and anxiety: Secondary | ICD-10-CM | POA: Diagnosis not present

## 2021-06-23 DIAGNOSIS — G311 Senile degeneration of brain, not elsewhere classified: Secondary | ICD-10-CM | POA: Diagnosis not present

## 2021-06-23 DIAGNOSIS — I10 Essential (primary) hypertension: Secondary | ICD-10-CM | POA: Diagnosis not present

## 2021-06-23 DIAGNOSIS — E785 Hyperlipidemia, unspecified: Secondary | ICD-10-CM | POA: Diagnosis not present

## 2021-06-23 DIAGNOSIS — E119 Type 2 diabetes mellitus without complications: Secondary | ICD-10-CM | POA: Diagnosis not present

## 2021-06-23 DIAGNOSIS — F03C0 Unspecified dementia, severe, without behavioral disturbance, psychotic disturbance, mood disturbance, and anxiety: Secondary | ICD-10-CM | POA: Diagnosis not present

## 2021-06-23 DIAGNOSIS — M6281 Muscle weakness (generalized): Secondary | ICD-10-CM | POA: Diagnosis not present

## 2021-06-23 DIAGNOSIS — Z8616 Personal history of COVID-19: Secondary | ICD-10-CM | POA: Diagnosis not present

## 2021-06-27 DIAGNOSIS — Z8616 Personal history of COVID-19: Secondary | ICD-10-CM | POA: Diagnosis not present

## 2021-06-27 DIAGNOSIS — G311 Senile degeneration of brain, not elsewhere classified: Secondary | ICD-10-CM | POA: Diagnosis not present

## 2021-06-27 DIAGNOSIS — E785 Hyperlipidemia, unspecified: Secondary | ICD-10-CM | POA: Diagnosis not present

## 2021-06-27 DIAGNOSIS — I10 Essential (primary) hypertension: Secondary | ICD-10-CM | POA: Diagnosis not present

## 2021-06-27 DIAGNOSIS — M6281 Muscle weakness (generalized): Secondary | ICD-10-CM | POA: Diagnosis not present

## 2021-06-27 DIAGNOSIS — F03C0 Unspecified dementia, severe, without behavioral disturbance, psychotic disturbance, mood disturbance, and anxiety: Secondary | ICD-10-CM | POA: Diagnosis not present

## 2021-07-03 DIAGNOSIS — Z79899 Other long term (current) drug therapy: Secondary | ICD-10-CM | POA: Diagnosis not present

## 2021-07-03 DIAGNOSIS — I10 Essential (primary) hypertension: Secondary | ICD-10-CM | POA: Diagnosis not present

## 2021-07-03 DIAGNOSIS — F0280 Dementia in other diseases classified elsewhere without behavioral disturbance: Secondary | ICD-10-CM | POA: Diagnosis not present

## 2021-07-03 DIAGNOSIS — E785 Hyperlipidemia, unspecified: Secondary | ICD-10-CM | POA: Diagnosis not present

## 2021-07-03 DIAGNOSIS — E559 Vitamin D deficiency, unspecified: Secondary | ICD-10-CM | POA: Diagnosis not present

## 2021-07-03 DIAGNOSIS — H04129 Dry eye syndrome of unspecified lacrimal gland: Secondary | ICD-10-CM | POA: Diagnosis not present

## 2021-07-04 DIAGNOSIS — I10 Essential (primary) hypertension: Secondary | ICD-10-CM | POA: Diagnosis not present

## 2021-07-04 DIAGNOSIS — E785 Hyperlipidemia, unspecified: Secondary | ICD-10-CM | POA: Diagnosis not present

## 2021-07-04 DIAGNOSIS — F03C0 Unspecified dementia, severe, without behavioral disturbance, psychotic disturbance, mood disturbance, and anxiety: Secondary | ICD-10-CM | POA: Diagnosis not present

## 2021-07-04 DIAGNOSIS — M6281 Muscle weakness (generalized): Secondary | ICD-10-CM | POA: Diagnosis not present

## 2021-07-04 DIAGNOSIS — G311 Senile degeneration of brain, not elsewhere classified: Secondary | ICD-10-CM | POA: Diagnosis not present

## 2021-07-04 DIAGNOSIS — Z8616 Personal history of COVID-19: Secondary | ICD-10-CM | POA: Diagnosis not present

## 2021-07-05 DIAGNOSIS — Z8616 Personal history of COVID-19: Secondary | ICD-10-CM | POA: Diagnosis not present

## 2021-07-05 DIAGNOSIS — G311 Senile degeneration of brain, not elsewhere classified: Secondary | ICD-10-CM | POA: Diagnosis not present

## 2021-07-05 DIAGNOSIS — E785 Hyperlipidemia, unspecified: Secondary | ICD-10-CM | POA: Diagnosis not present

## 2021-07-05 DIAGNOSIS — M6281 Muscle weakness (generalized): Secondary | ICD-10-CM | POA: Diagnosis not present

## 2021-07-05 DIAGNOSIS — I10 Essential (primary) hypertension: Secondary | ICD-10-CM | POA: Diagnosis not present

## 2021-07-05 DIAGNOSIS — F03C0 Unspecified dementia, severe, without behavioral disturbance, psychotic disturbance, mood disturbance, and anxiety: Secondary | ICD-10-CM | POA: Diagnosis not present

## 2021-07-11 DIAGNOSIS — E785 Hyperlipidemia, unspecified: Secondary | ICD-10-CM | POA: Diagnosis not present

## 2021-07-11 DIAGNOSIS — M6281 Muscle weakness (generalized): Secondary | ICD-10-CM | POA: Diagnosis not present

## 2021-07-11 DIAGNOSIS — F03C0 Unspecified dementia, severe, without behavioral disturbance, psychotic disturbance, mood disturbance, and anxiety: Secondary | ICD-10-CM | POA: Diagnosis not present

## 2021-07-11 DIAGNOSIS — G311 Senile degeneration of brain, not elsewhere classified: Secondary | ICD-10-CM | POA: Diagnosis not present

## 2021-07-11 DIAGNOSIS — Z8616 Personal history of COVID-19: Secondary | ICD-10-CM | POA: Diagnosis not present

## 2021-07-11 DIAGNOSIS — I10 Essential (primary) hypertension: Secondary | ICD-10-CM | POA: Diagnosis not present

## 2021-07-18 DIAGNOSIS — M6281 Muscle weakness (generalized): Secondary | ICD-10-CM | POA: Diagnosis not present

## 2021-07-18 DIAGNOSIS — E785 Hyperlipidemia, unspecified: Secondary | ICD-10-CM | POA: Diagnosis not present

## 2021-07-18 DIAGNOSIS — F03C0 Unspecified dementia, severe, without behavioral disturbance, psychotic disturbance, mood disturbance, and anxiety: Secondary | ICD-10-CM | POA: Diagnosis not present

## 2021-07-18 DIAGNOSIS — G311 Senile degeneration of brain, not elsewhere classified: Secondary | ICD-10-CM | POA: Diagnosis not present

## 2021-07-18 DIAGNOSIS — Z8616 Personal history of COVID-19: Secondary | ICD-10-CM | POA: Diagnosis not present

## 2021-07-18 DIAGNOSIS — I10 Essential (primary) hypertension: Secondary | ICD-10-CM | POA: Diagnosis not present

## 2021-07-20 ENCOUNTER — Telehealth: Payer: Self-pay

## 2021-07-20 DIAGNOSIS — E785 Hyperlipidemia, unspecified: Secondary | ICD-10-CM | POA: Diagnosis not present

## 2021-07-20 DIAGNOSIS — Z8616 Personal history of COVID-19: Secondary | ICD-10-CM | POA: Diagnosis not present

## 2021-07-20 DIAGNOSIS — G311 Senile degeneration of brain, not elsewhere classified: Secondary | ICD-10-CM | POA: Diagnosis not present

## 2021-07-20 DIAGNOSIS — R2689 Other abnormalities of gait and mobility: Secondary | ICD-10-CM | POA: Diagnosis not present

## 2021-07-20 DIAGNOSIS — I10 Essential (primary) hypertension: Secondary | ICD-10-CM | POA: Diagnosis not present

## 2021-07-20 DIAGNOSIS — F03C0 Unspecified dementia, severe, without behavioral disturbance, psychotic disturbance, mood disturbance, and anxiety: Secondary | ICD-10-CM | POA: Diagnosis not present

## 2021-07-20 DIAGNOSIS — J9621 Acute and chronic respiratory failure with hypoxia: Secondary | ICD-10-CM | POA: Diagnosis not present

## 2021-07-20 DIAGNOSIS — M6281 Muscle weakness (generalized): Secondary | ICD-10-CM | POA: Diagnosis not present

## 2021-07-20 DIAGNOSIS — R4702 Dysphasia: Secondary | ICD-10-CM | POA: Diagnosis not present

## 2021-07-20 DIAGNOSIS — R278 Other lack of coordination: Secondary | ICD-10-CM | POA: Diagnosis not present

## 2021-07-25 DIAGNOSIS — I10 Essential (primary) hypertension: Secondary | ICD-10-CM | POA: Diagnosis not present

## 2021-07-25 DIAGNOSIS — Z8616 Personal history of COVID-19: Secondary | ICD-10-CM | POA: Diagnosis not present

## 2021-07-25 DIAGNOSIS — M6281 Muscle weakness (generalized): Secondary | ICD-10-CM | POA: Diagnosis not present

## 2021-07-25 DIAGNOSIS — E785 Hyperlipidemia, unspecified: Secondary | ICD-10-CM | POA: Diagnosis not present

## 2021-07-25 DIAGNOSIS — F03C0 Unspecified dementia, severe, without behavioral disturbance, psychotic disturbance, mood disturbance, and anxiety: Secondary | ICD-10-CM | POA: Diagnosis not present

## 2021-07-25 DIAGNOSIS — G311 Senile degeneration of brain, not elsewhere classified: Secondary | ICD-10-CM | POA: Diagnosis not present

## 2021-07-26 DIAGNOSIS — G311 Senile degeneration of brain, not elsewhere classified: Secondary | ICD-10-CM | POA: Diagnosis not present

## 2021-07-26 DIAGNOSIS — I10 Essential (primary) hypertension: Secondary | ICD-10-CM | POA: Diagnosis not present

## 2021-07-26 DIAGNOSIS — Z8616 Personal history of COVID-19: Secondary | ICD-10-CM | POA: Diagnosis not present

## 2021-07-26 DIAGNOSIS — F03C0 Unspecified dementia, severe, without behavioral disturbance, psychotic disturbance, mood disturbance, and anxiety: Secondary | ICD-10-CM | POA: Diagnosis not present

## 2021-07-26 DIAGNOSIS — M6281 Muscle weakness (generalized): Secondary | ICD-10-CM | POA: Diagnosis not present

## 2021-07-26 DIAGNOSIS — E785 Hyperlipidemia, unspecified: Secondary | ICD-10-CM | POA: Diagnosis not present

## 2021-08-01 DIAGNOSIS — E785 Hyperlipidemia, unspecified: Secondary | ICD-10-CM | POA: Diagnosis not present

## 2021-08-01 DIAGNOSIS — F03C0 Unspecified dementia, severe, without behavioral disturbance, psychotic disturbance, mood disturbance, and anxiety: Secondary | ICD-10-CM | POA: Diagnosis not present

## 2021-08-01 DIAGNOSIS — I10 Essential (primary) hypertension: Secondary | ICD-10-CM | POA: Diagnosis not present

## 2021-08-01 DIAGNOSIS — Z8616 Personal history of COVID-19: Secondary | ICD-10-CM | POA: Diagnosis not present

## 2021-08-01 DIAGNOSIS — G311 Senile degeneration of brain, not elsewhere classified: Secondary | ICD-10-CM | POA: Diagnosis not present

## 2021-08-01 DIAGNOSIS — M6281 Muscle weakness (generalized): Secondary | ICD-10-CM | POA: Diagnosis not present

## 2021-08-02 DIAGNOSIS — Z23 Encounter for immunization: Secondary | ICD-10-CM | POA: Diagnosis not present

## 2021-08-08 DIAGNOSIS — M6281 Muscle weakness (generalized): Secondary | ICD-10-CM | POA: Diagnosis not present

## 2021-08-08 DIAGNOSIS — E785 Hyperlipidemia, unspecified: Secondary | ICD-10-CM | POA: Diagnosis not present

## 2021-08-08 DIAGNOSIS — Z8616 Personal history of COVID-19: Secondary | ICD-10-CM | POA: Diagnosis not present

## 2021-08-08 DIAGNOSIS — G311 Senile degeneration of brain, not elsewhere classified: Secondary | ICD-10-CM | POA: Diagnosis not present

## 2021-08-08 DIAGNOSIS — F03C0 Unspecified dementia, severe, without behavioral disturbance, psychotic disturbance, mood disturbance, and anxiety: Secondary | ICD-10-CM | POA: Diagnosis not present

## 2021-08-08 DIAGNOSIS — I10 Essential (primary) hypertension: Secondary | ICD-10-CM | POA: Diagnosis not present

## 2021-08-09 DIAGNOSIS — J9621 Acute and chronic respiratory failure with hypoxia: Secondary | ICD-10-CM | POA: Diagnosis not present

## 2021-08-09 DIAGNOSIS — R5381 Other malaise: Secondary | ICD-10-CM | POA: Diagnosis not present

## 2021-08-09 DIAGNOSIS — E785 Hyperlipidemia, unspecified: Secondary | ICD-10-CM | POA: Diagnosis not present

## 2021-08-09 DIAGNOSIS — I10 Essential (primary) hypertension: Secondary | ICD-10-CM | POA: Diagnosis not present

## 2021-08-09 DIAGNOSIS — F0280 Dementia in other diseases classified elsewhere without behavioral disturbance: Secondary | ICD-10-CM | POA: Diagnosis not present

## 2021-08-09 DIAGNOSIS — E559 Vitamin D deficiency, unspecified: Secondary | ICD-10-CM | POA: Diagnosis not present

## 2021-08-16 DIAGNOSIS — E785 Hyperlipidemia, unspecified: Secondary | ICD-10-CM | POA: Diagnosis not present

## 2021-08-16 DIAGNOSIS — Z8616 Personal history of COVID-19: Secondary | ICD-10-CM | POA: Diagnosis not present

## 2021-08-16 DIAGNOSIS — F03C0 Unspecified dementia, severe, without behavioral disturbance, psychotic disturbance, mood disturbance, and anxiety: Secondary | ICD-10-CM | POA: Diagnosis not present

## 2021-08-16 DIAGNOSIS — I10 Essential (primary) hypertension: Secondary | ICD-10-CM | POA: Diagnosis not present

## 2021-08-16 DIAGNOSIS — G311 Senile degeneration of brain, not elsewhere classified: Secondary | ICD-10-CM | POA: Diagnosis not present

## 2021-08-16 DIAGNOSIS — M6281 Muscle weakness (generalized): Secondary | ICD-10-CM | POA: Diagnosis not present

## 2021-08-20 DIAGNOSIS — R4702 Dysphasia: Secondary | ICD-10-CM | POA: Diagnosis not present

## 2021-08-20 DIAGNOSIS — J9621 Acute and chronic respiratory failure with hypoxia: Secondary | ICD-10-CM | POA: Diagnosis not present

## 2021-08-20 DIAGNOSIS — E785 Hyperlipidemia, unspecified: Secondary | ICD-10-CM | POA: Diagnosis not present

## 2021-08-20 DIAGNOSIS — R2689 Other abnormalities of gait and mobility: Secondary | ICD-10-CM | POA: Diagnosis not present

## 2021-08-20 DIAGNOSIS — R52 Pain, unspecified: Secondary | ICD-10-CM | POA: Diagnosis not present

## 2021-08-20 DIAGNOSIS — M6281 Muscle weakness (generalized): Secondary | ICD-10-CM | POA: Diagnosis not present

## 2021-08-20 DIAGNOSIS — G311 Senile degeneration of brain, not elsewhere classified: Secondary | ICD-10-CM | POA: Diagnosis not present

## 2021-08-20 DIAGNOSIS — I10 Essential (primary) hypertension: Secondary | ICD-10-CM | POA: Diagnosis not present

## 2021-08-20 DIAGNOSIS — Z8616 Personal history of COVID-19: Secondary | ICD-10-CM | POA: Diagnosis not present

## 2021-08-20 DIAGNOSIS — R278 Other lack of coordination: Secondary | ICD-10-CM | POA: Diagnosis not present

## 2021-08-20 DIAGNOSIS — H04123 Dry eye syndrome of bilateral lacrimal glands: Secondary | ICD-10-CM | POA: Diagnosis not present

## 2021-08-20 DIAGNOSIS — R0602 Shortness of breath: Secondary | ICD-10-CM | POA: Diagnosis not present

## 2021-08-20 DIAGNOSIS — F03C0 Unspecified dementia, severe, without behavioral disturbance, psychotic disturbance, mood disturbance, and anxiety: Secondary | ICD-10-CM | POA: Diagnosis not present

## 2021-08-23 DIAGNOSIS — G311 Senile degeneration of brain, not elsewhere classified: Secondary | ICD-10-CM | POA: Diagnosis not present

## 2021-08-23 DIAGNOSIS — I10 Essential (primary) hypertension: Secondary | ICD-10-CM | POA: Diagnosis not present

## 2021-08-23 DIAGNOSIS — E785 Hyperlipidemia, unspecified: Secondary | ICD-10-CM | POA: Diagnosis not present

## 2021-08-23 DIAGNOSIS — Z8616 Personal history of COVID-19: Secondary | ICD-10-CM | POA: Diagnosis not present

## 2021-08-23 DIAGNOSIS — M6281 Muscle weakness (generalized): Secondary | ICD-10-CM | POA: Diagnosis not present

## 2021-08-23 DIAGNOSIS — F03C0 Unspecified dementia, severe, without behavioral disturbance, psychotic disturbance, mood disturbance, and anxiety: Secondary | ICD-10-CM | POA: Diagnosis not present

## 2021-08-24 DIAGNOSIS — G311 Senile degeneration of brain, not elsewhere classified: Secondary | ICD-10-CM | POA: Diagnosis not present

## 2021-08-24 DIAGNOSIS — F03C0 Unspecified dementia, severe, without behavioral disturbance, psychotic disturbance, mood disturbance, and anxiety: Secondary | ICD-10-CM | POA: Diagnosis not present

## 2021-08-24 DIAGNOSIS — M6281 Muscle weakness (generalized): Secondary | ICD-10-CM | POA: Diagnosis not present

## 2021-08-24 DIAGNOSIS — I10 Essential (primary) hypertension: Secondary | ICD-10-CM | POA: Diagnosis not present

## 2021-08-24 DIAGNOSIS — Z8616 Personal history of COVID-19: Secondary | ICD-10-CM | POA: Diagnosis not present

## 2021-08-24 DIAGNOSIS — E785 Hyperlipidemia, unspecified: Secondary | ICD-10-CM | POA: Diagnosis not present

## 2021-08-29 DIAGNOSIS — G311 Senile degeneration of brain, not elsewhere classified: Secondary | ICD-10-CM | POA: Diagnosis not present

## 2021-08-29 DIAGNOSIS — E785 Hyperlipidemia, unspecified: Secondary | ICD-10-CM | POA: Diagnosis not present

## 2021-08-29 DIAGNOSIS — I10 Essential (primary) hypertension: Secondary | ICD-10-CM | POA: Diagnosis not present

## 2021-08-29 DIAGNOSIS — Z8616 Personal history of COVID-19: Secondary | ICD-10-CM | POA: Diagnosis not present

## 2021-08-29 DIAGNOSIS — M6281 Muscle weakness (generalized): Secondary | ICD-10-CM | POA: Diagnosis not present

## 2021-08-29 DIAGNOSIS — F03C0 Unspecified dementia, severe, without behavioral disturbance, psychotic disturbance, mood disturbance, and anxiety: Secondary | ICD-10-CM | POA: Diagnosis not present

## 2021-09-05 DIAGNOSIS — E785 Hyperlipidemia, unspecified: Secondary | ICD-10-CM | POA: Diagnosis not present

## 2021-09-05 DIAGNOSIS — G311 Senile degeneration of brain, not elsewhere classified: Secondary | ICD-10-CM | POA: Diagnosis not present

## 2021-09-05 DIAGNOSIS — I10 Essential (primary) hypertension: Secondary | ICD-10-CM | POA: Diagnosis not present

## 2021-09-05 DIAGNOSIS — M6281 Muscle weakness (generalized): Secondary | ICD-10-CM | POA: Diagnosis not present

## 2021-09-05 DIAGNOSIS — Z8616 Personal history of COVID-19: Secondary | ICD-10-CM | POA: Diagnosis not present

## 2021-09-05 DIAGNOSIS — F03C0 Unspecified dementia, severe, without behavioral disturbance, psychotic disturbance, mood disturbance, and anxiety: Secondary | ICD-10-CM | POA: Diagnosis not present

## 2021-09-12 DIAGNOSIS — E785 Hyperlipidemia, unspecified: Secondary | ICD-10-CM | POA: Diagnosis not present

## 2021-09-12 DIAGNOSIS — F03C0 Unspecified dementia, severe, without behavioral disturbance, psychotic disturbance, mood disturbance, and anxiety: Secondary | ICD-10-CM | POA: Diagnosis not present

## 2021-09-12 DIAGNOSIS — G311 Senile degeneration of brain, not elsewhere classified: Secondary | ICD-10-CM | POA: Diagnosis not present

## 2021-09-12 DIAGNOSIS — I10 Essential (primary) hypertension: Secondary | ICD-10-CM | POA: Diagnosis not present

## 2021-09-12 DIAGNOSIS — Z8616 Personal history of COVID-19: Secondary | ICD-10-CM | POA: Diagnosis not present

## 2021-09-12 DIAGNOSIS — M6281 Muscle weakness (generalized): Secondary | ICD-10-CM | POA: Diagnosis not present

## 2021-09-13 DIAGNOSIS — M79675 Pain in left toe(s): Secondary | ICD-10-CM | POA: Diagnosis not present

## 2021-09-13 DIAGNOSIS — M79674 Pain in right toe(s): Secondary | ICD-10-CM | POA: Diagnosis not present

## 2021-09-13 DIAGNOSIS — B351 Tinea unguium: Secondary | ICD-10-CM | POA: Diagnosis not present

## 2021-09-19 DIAGNOSIS — M6281 Muscle weakness (generalized): Secondary | ICD-10-CM | POA: Diagnosis not present

## 2021-09-19 DIAGNOSIS — F03C0 Unspecified dementia, severe, without behavioral disturbance, psychotic disturbance, mood disturbance, and anxiety: Secondary | ICD-10-CM | POA: Diagnosis not present

## 2021-09-19 DIAGNOSIS — E785 Hyperlipidemia, unspecified: Secondary | ICD-10-CM | POA: Diagnosis not present

## 2021-09-19 DIAGNOSIS — G311 Senile degeneration of brain, not elsewhere classified: Secondary | ICD-10-CM | POA: Diagnosis not present

## 2021-09-19 DIAGNOSIS — Z8616 Personal history of COVID-19: Secondary | ICD-10-CM | POA: Diagnosis not present

## 2021-09-19 DIAGNOSIS — I10 Essential (primary) hypertension: Secondary | ICD-10-CM | POA: Diagnosis not present

## 2021-09-20 DIAGNOSIS — R0602 Shortness of breath: Secondary | ICD-10-CM | POA: Diagnosis not present

## 2021-09-20 DIAGNOSIS — R278 Other lack of coordination: Secondary | ICD-10-CM | POA: Diagnosis not present

## 2021-09-20 DIAGNOSIS — R4702 Dysphasia: Secondary | ICD-10-CM | POA: Diagnosis not present

## 2021-09-20 DIAGNOSIS — G311 Senile degeneration of brain, not elsewhere classified: Secondary | ICD-10-CM | POA: Diagnosis not present

## 2021-09-20 DIAGNOSIS — R2689 Other abnormalities of gait and mobility: Secondary | ICD-10-CM | POA: Diagnosis not present

## 2021-09-20 DIAGNOSIS — F03C0 Unspecified dementia, severe, without behavioral disturbance, psychotic disturbance, mood disturbance, and anxiety: Secondary | ICD-10-CM | POA: Diagnosis not present

## 2021-09-20 DIAGNOSIS — M6281 Muscle weakness (generalized): Secondary | ICD-10-CM | POA: Diagnosis not present

## 2021-09-20 DIAGNOSIS — E785 Hyperlipidemia, unspecified: Secondary | ICD-10-CM | POA: Diagnosis not present

## 2021-09-20 DIAGNOSIS — J9621 Acute and chronic respiratory failure with hypoxia: Secondary | ICD-10-CM | POA: Diagnosis not present

## 2021-09-20 DIAGNOSIS — I10 Essential (primary) hypertension: Secondary | ICD-10-CM | POA: Diagnosis not present

## 2021-09-20 DIAGNOSIS — H04123 Dry eye syndrome of bilateral lacrimal glands: Secondary | ICD-10-CM | POA: Diagnosis not present

## 2021-09-20 DIAGNOSIS — R52 Pain, unspecified: Secondary | ICD-10-CM | POA: Diagnosis not present

## 2021-09-20 DIAGNOSIS — Z8616 Personal history of COVID-19: Secondary | ICD-10-CM | POA: Diagnosis not present

## 2021-09-20 NOTE — Telephone Encounter (Signed)
Opened in error

## 2021-09-26 DIAGNOSIS — G311 Senile degeneration of brain, not elsewhere classified: Secondary | ICD-10-CM | POA: Diagnosis not present

## 2021-09-26 DIAGNOSIS — M6281 Muscle weakness (generalized): Secondary | ICD-10-CM | POA: Diagnosis not present

## 2021-09-26 DIAGNOSIS — E785 Hyperlipidemia, unspecified: Secondary | ICD-10-CM | POA: Diagnosis not present

## 2021-09-26 DIAGNOSIS — F03C0 Unspecified dementia, severe, without behavioral disturbance, psychotic disturbance, mood disturbance, and anxiety: Secondary | ICD-10-CM | POA: Diagnosis not present

## 2021-09-26 DIAGNOSIS — Z8616 Personal history of COVID-19: Secondary | ICD-10-CM | POA: Diagnosis not present

## 2021-09-26 DIAGNOSIS — I10 Essential (primary) hypertension: Secondary | ICD-10-CM | POA: Diagnosis not present

## 2021-10-03 DIAGNOSIS — I10 Essential (primary) hypertension: Secondary | ICD-10-CM | POA: Diagnosis not present

## 2021-10-03 DIAGNOSIS — G311 Senile degeneration of brain, not elsewhere classified: Secondary | ICD-10-CM | POA: Diagnosis not present

## 2021-10-03 DIAGNOSIS — M6281 Muscle weakness (generalized): Secondary | ICD-10-CM | POA: Diagnosis not present

## 2021-10-03 DIAGNOSIS — E785 Hyperlipidemia, unspecified: Secondary | ICD-10-CM | POA: Diagnosis not present

## 2021-10-03 DIAGNOSIS — Z8616 Personal history of COVID-19: Secondary | ICD-10-CM | POA: Diagnosis not present

## 2021-10-03 DIAGNOSIS — F03C0 Unspecified dementia, severe, without behavioral disturbance, psychotic disturbance, mood disturbance, and anxiety: Secondary | ICD-10-CM | POA: Diagnosis not present

## 2021-10-09 DIAGNOSIS — Z8616 Personal history of COVID-19: Secondary | ICD-10-CM | POA: Diagnosis not present

## 2021-10-09 DIAGNOSIS — I10 Essential (primary) hypertension: Secondary | ICD-10-CM | POA: Diagnosis not present

## 2021-10-09 DIAGNOSIS — M6281 Muscle weakness (generalized): Secondary | ICD-10-CM | POA: Diagnosis not present

## 2021-10-09 DIAGNOSIS — G311 Senile degeneration of brain, not elsewhere classified: Secondary | ICD-10-CM | POA: Diagnosis not present

## 2021-10-09 DIAGNOSIS — E785 Hyperlipidemia, unspecified: Secondary | ICD-10-CM | POA: Diagnosis not present

## 2021-10-09 DIAGNOSIS — F03C0 Unspecified dementia, severe, without behavioral disturbance, psychotic disturbance, mood disturbance, and anxiety: Secondary | ICD-10-CM | POA: Diagnosis not present

## 2021-10-10 DIAGNOSIS — G311 Senile degeneration of brain, not elsewhere classified: Secondary | ICD-10-CM | POA: Diagnosis not present

## 2021-10-10 DIAGNOSIS — E785 Hyperlipidemia, unspecified: Secondary | ICD-10-CM | POA: Diagnosis not present

## 2021-10-10 DIAGNOSIS — M6281 Muscle weakness (generalized): Secondary | ICD-10-CM | POA: Diagnosis not present

## 2021-10-10 DIAGNOSIS — F03C0 Unspecified dementia, severe, without behavioral disturbance, psychotic disturbance, mood disturbance, and anxiety: Secondary | ICD-10-CM | POA: Diagnosis not present

## 2021-10-10 DIAGNOSIS — I10 Essential (primary) hypertension: Secondary | ICD-10-CM | POA: Diagnosis not present

## 2021-10-10 DIAGNOSIS — Z8616 Personal history of COVID-19: Secondary | ICD-10-CM | POA: Diagnosis not present

## 2021-10-12 DIAGNOSIS — J9621 Acute and chronic respiratory failure with hypoxia: Secondary | ICD-10-CM | POA: Diagnosis not present

## 2021-10-12 DIAGNOSIS — G311 Senile degeneration of brain, not elsewhere classified: Secondary | ICD-10-CM | POA: Diagnosis not present

## 2021-10-12 DIAGNOSIS — Z8616 Personal history of COVID-19: Secondary | ICD-10-CM | POA: Diagnosis not present

## 2021-10-12 DIAGNOSIS — I10 Essential (primary) hypertension: Secondary | ICD-10-CM | POA: Diagnosis not present

## 2021-10-12 DIAGNOSIS — M6281 Muscle weakness (generalized): Secondary | ICD-10-CM | POA: Diagnosis not present

## 2021-10-12 DIAGNOSIS — E785 Hyperlipidemia, unspecified: Secondary | ICD-10-CM | POA: Diagnosis not present

## 2021-10-12 DIAGNOSIS — F03C0 Unspecified dementia, severe, without behavioral disturbance, psychotic disturbance, mood disturbance, and anxiety: Secondary | ICD-10-CM | POA: Diagnosis not present

## 2021-10-12 DIAGNOSIS — E559 Vitamin D deficiency, unspecified: Secondary | ICD-10-CM | POA: Diagnosis not present

## 2021-10-12 DIAGNOSIS — F0280 Dementia in other diseases classified elsewhere without behavioral disturbance: Secondary | ICD-10-CM | POA: Diagnosis not present

## 2021-10-12 DIAGNOSIS — R5381 Other malaise: Secondary | ICD-10-CM | POA: Diagnosis not present

## 2021-10-13 DIAGNOSIS — N390 Urinary tract infection, site not specified: Secondary | ICD-10-CM | POA: Diagnosis not present

## 2021-10-16 DIAGNOSIS — R5381 Other malaise: Secondary | ICD-10-CM | POA: Diagnosis not present

## 2021-10-16 DIAGNOSIS — F03C0 Unspecified dementia, severe, without behavioral disturbance, psychotic disturbance, mood disturbance, and anxiety: Secondary | ICD-10-CM | POA: Diagnosis not present

## 2021-10-16 DIAGNOSIS — N390 Urinary tract infection, site not specified: Secondary | ICD-10-CM | POA: Diagnosis not present

## 2021-10-16 DIAGNOSIS — I10 Essential (primary) hypertension: Secondary | ICD-10-CM | POA: Diagnosis not present

## 2021-10-16 DIAGNOSIS — R319 Hematuria, unspecified: Secondary | ICD-10-CM | POA: Diagnosis not present

## 2021-10-16 DIAGNOSIS — Z8616 Personal history of COVID-19: Secondary | ICD-10-CM | POA: Diagnosis not present

## 2021-10-16 DIAGNOSIS — E559 Vitamin D deficiency, unspecified: Secondary | ICD-10-CM | POA: Diagnosis not present

## 2021-10-16 DIAGNOSIS — M6281 Muscle weakness (generalized): Secondary | ICD-10-CM | POA: Diagnosis not present

## 2021-10-16 DIAGNOSIS — R300 Dysuria: Secondary | ICD-10-CM | POA: Diagnosis not present

## 2021-10-16 DIAGNOSIS — G311 Senile degeneration of brain, not elsewhere classified: Secondary | ICD-10-CM | POA: Diagnosis not present

## 2021-10-16 DIAGNOSIS — E785 Hyperlipidemia, unspecified: Secondary | ICD-10-CM | POA: Diagnosis not present

## 2021-10-18 DIAGNOSIS — R4702 Dysphasia: Secondary | ICD-10-CM | POA: Diagnosis not present

## 2021-10-18 DIAGNOSIS — R278 Other lack of coordination: Secondary | ICD-10-CM | POA: Diagnosis not present

## 2021-10-18 DIAGNOSIS — F03C0 Unspecified dementia, severe, without behavioral disturbance, psychotic disturbance, mood disturbance, and anxiety: Secondary | ICD-10-CM | POA: Diagnosis not present

## 2021-10-18 DIAGNOSIS — R2689 Other abnormalities of gait and mobility: Secondary | ICD-10-CM | POA: Diagnosis not present

## 2021-10-18 DIAGNOSIS — R0602 Shortness of breath: Secondary | ICD-10-CM | POA: Diagnosis not present

## 2021-10-18 DIAGNOSIS — G311 Senile degeneration of brain, not elsewhere classified: Secondary | ICD-10-CM | POA: Diagnosis not present

## 2021-10-18 DIAGNOSIS — E785 Hyperlipidemia, unspecified: Secondary | ICD-10-CM | POA: Diagnosis not present

## 2021-10-18 DIAGNOSIS — I10 Essential (primary) hypertension: Secondary | ICD-10-CM | POA: Diagnosis not present

## 2021-10-18 DIAGNOSIS — Z8616 Personal history of COVID-19: Secondary | ICD-10-CM | POA: Diagnosis not present

## 2021-10-18 DIAGNOSIS — H04123 Dry eye syndrome of bilateral lacrimal glands: Secondary | ICD-10-CM | POA: Diagnosis not present

## 2021-10-18 DIAGNOSIS — J9621 Acute and chronic respiratory failure with hypoxia: Secondary | ICD-10-CM | POA: Diagnosis not present

## 2021-10-18 DIAGNOSIS — M6281 Muscle weakness (generalized): Secondary | ICD-10-CM | POA: Diagnosis not present

## 2021-10-18 DIAGNOSIS — R52 Pain, unspecified: Secondary | ICD-10-CM | POA: Diagnosis not present

## 2021-10-19 DIAGNOSIS — E785 Hyperlipidemia, unspecified: Secondary | ICD-10-CM | POA: Diagnosis not present

## 2021-10-19 DIAGNOSIS — I10 Essential (primary) hypertension: Secondary | ICD-10-CM | POA: Diagnosis not present

## 2021-10-19 DIAGNOSIS — F03C0 Unspecified dementia, severe, without behavioral disturbance, psychotic disturbance, mood disturbance, and anxiety: Secondary | ICD-10-CM | POA: Diagnosis not present

## 2021-10-19 DIAGNOSIS — G311 Senile degeneration of brain, not elsewhere classified: Secondary | ICD-10-CM | POA: Diagnosis not present

## 2021-10-19 DIAGNOSIS — M6281 Muscle weakness (generalized): Secondary | ICD-10-CM | POA: Diagnosis not present

## 2021-10-19 DIAGNOSIS — Z8616 Personal history of COVID-19: Secondary | ICD-10-CM | POA: Diagnosis not present

## 2021-10-23 DIAGNOSIS — F03C0 Unspecified dementia, severe, without behavioral disturbance, psychotic disturbance, mood disturbance, and anxiety: Secondary | ICD-10-CM | POA: Diagnosis not present

## 2021-10-23 DIAGNOSIS — E785 Hyperlipidemia, unspecified: Secondary | ICD-10-CM | POA: Diagnosis not present

## 2021-10-23 DIAGNOSIS — I10 Essential (primary) hypertension: Secondary | ICD-10-CM | POA: Diagnosis not present

## 2021-10-23 DIAGNOSIS — G311 Senile degeneration of brain, not elsewhere classified: Secondary | ICD-10-CM | POA: Diagnosis not present

## 2021-10-23 DIAGNOSIS — Z8616 Personal history of COVID-19: Secondary | ICD-10-CM | POA: Diagnosis not present

## 2021-10-23 DIAGNOSIS — M6281 Muscle weakness (generalized): Secondary | ICD-10-CM | POA: Diagnosis not present

## 2021-10-31 DIAGNOSIS — E785 Hyperlipidemia, unspecified: Secondary | ICD-10-CM | POA: Diagnosis not present

## 2021-10-31 DIAGNOSIS — Z8616 Personal history of COVID-19: Secondary | ICD-10-CM | POA: Diagnosis not present

## 2021-10-31 DIAGNOSIS — F03C0 Unspecified dementia, severe, without behavioral disturbance, psychotic disturbance, mood disturbance, and anxiety: Secondary | ICD-10-CM | POA: Diagnosis not present

## 2021-10-31 DIAGNOSIS — G311 Senile degeneration of brain, not elsewhere classified: Secondary | ICD-10-CM | POA: Diagnosis not present

## 2021-10-31 DIAGNOSIS — I10 Essential (primary) hypertension: Secondary | ICD-10-CM | POA: Diagnosis not present

## 2021-10-31 DIAGNOSIS — M6281 Muscle weakness (generalized): Secondary | ICD-10-CM | POA: Diagnosis not present

## 2021-11-07 DIAGNOSIS — M6281 Muscle weakness (generalized): Secondary | ICD-10-CM | POA: Diagnosis not present

## 2021-11-07 DIAGNOSIS — Z8616 Personal history of COVID-19: Secondary | ICD-10-CM | POA: Diagnosis not present

## 2021-11-07 DIAGNOSIS — F03C0 Unspecified dementia, severe, without behavioral disturbance, psychotic disturbance, mood disturbance, and anxiety: Secondary | ICD-10-CM | POA: Diagnosis not present

## 2021-11-07 DIAGNOSIS — G311 Senile degeneration of brain, not elsewhere classified: Secondary | ICD-10-CM | POA: Diagnosis not present

## 2021-11-07 DIAGNOSIS — I10 Essential (primary) hypertension: Secondary | ICD-10-CM | POA: Diagnosis not present

## 2021-11-07 DIAGNOSIS — E785 Hyperlipidemia, unspecified: Secondary | ICD-10-CM | POA: Diagnosis not present

## 2021-11-08 DIAGNOSIS — G311 Senile degeneration of brain, not elsewhere classified: Secondary | ICD-10-CM | POA: Diagnosis not present

## 2021-11-08 DIAGNOSIS — M6281 Muscle weakness (generalized): Secondary | ICD-10-CM | POA: Diagnosis not present

## 2021-11-08 DIAGNOSIS — F03C0 Unspecified dementia, severe, without behavioral disturbance, psychotic disturbance, mood disturbance, and anxiety: Secondary | ICD-10-CM | POA: Diagnosis not present

## 2021-11-08 DIAGNOSIS — Z8616 Personal history of COVID-19: Secondary | ICD-10-CM | POA: Diagnosis not present

## 2021-11-08 DIAGNOSIS — I10 Essential (primary) hypertension: Secondary | ICD-10-CM | POA: Diagnosis not present

## 2021-11-08 DIAGNOSIS — E785 Hyperlipidemia, unspecified: Secondary | ICD-10-CM | POA: Diagnosis not present

## 2021-11-13 DIAGNOSIS — Z20822 Contact with and (suspected) exposure to covid-19: Secondary | ICD-10-CM | POA: Diagnosis not present

## 2021-11-14 DIAGNOSIS — I10 Essential (primary) hypertension: Secondary | ICD-10-CM | POA: Diagnosis not present

## 2021-11-14 DIAGNOSIS — E785 Hyperlipidemia, unspecified: Secondary | ICD-10-CM | POA: Diagnosis not present

## 2021-11-14 DIAGNOSIS — G311 Senile degeneration of brain, not elsewhere classified: Secondary | ICD-10-CM | POA: Diagnosis not present

## 2021-11-14 DIAGNOSIS — M6281 Muscle weakness (generalized): Secondary | ICD-10-CM | POA: Diagnosis not present

## 2021-11-14 DIAGNOSIS — F03C0 Unspecified dementia, severe, without behavioral disturbance, psychotic disturbance, mood disturbance, and anxiety: Secondary | ICD-10-CM | POA: Diagnosis not present

## 2021-11-14 DIAGNOSIS — Z8616 Personal history of COVID-19: Secondary | ICD-10-CM | POA: Diagnosis not present

## 2021-11-18 DIAGNOSIS — Z8616 Personal history of COVID-19: Secondary | ICD-10-CM | POA: Diagnosis not present

## 2021-11-18 DIAGNOSIS — R278 Other lack of coordination: Secondary | ICD-10-CM | POA: Diagnosis not present

## 2021-11-18 DIAGNOSIS — F03C0 Unspecified dementia, severe, without behavioral disturbance, psychotic disturbance, mood disturbance, and anxiety: Secondary | ICD-10-CM | POA: Diagnosis not present

## 2021-11-18 DIAGNOSIS — M6281 Muscle weakness (generalized): Secondary | ICD-10-CM | POA: Diagnosis not present

## 2021-11-18 DIAGNOSIS — J9621 Acute and chronic respiratory failure with hypoxia: Secondary | ICD-10-CM | POA: Diagnosis not present

## 2021-11-18 DIAGNOSIS — E785 Hyperlipidemia, unspecified: Secondary | ICD-10-CM | POA: Diagnosis not present

## 2021-11-18 DIAGNOSIS — R0602 Shortness of breath: Secondary | ICD-10-CM | POA: Diagnosis not present

## 2021-11-18 DIAGNOSIS — M79674 Pain in right toe(s): Secondary | ICD-10-CM | POA: Diagnosis not present

## 2021-11-18 DIAGNOSIS — I10 Essential (primary) hypertension: Secondary | ICD-10-CM | POA: Diagnosis not present

## 2021-11-18 DIAGNOSIS — M79675 Pain in left toe(s): Secondary | ICD-10-CM | POA: Diagnosis not present

## 2021-11-18 DIAGNOSIS — H04123 Dry eye syndrome of bilateral lacrimal glands: Secondary | ICD-10-CM | POA: Diagnosis not present

## 2021-11-18 DIAGNOSIS — G311 Senile degeneration of brain, not elsewhere classified: Secondary | ICD-10-CM | POA: Diagnosis not present

## 2021-11-18 DIAGNOSIS — R4702 Dysphasia: Secondary | ICD-10-CM | POA: Diagnosis not present

## 2021-11-18 DIAGNOSIS — R2689 Other abnormalities of gait and mobility: Secondary | ICD-10-CM | POA: Diagnosis not present

## 2021-11-18 DIAGNOSIS — B351 Tinea unguium: Secondary | ICD-10-CM | POA: Diagnosis not present

## 2021-11-18 DIAGNOSIS — R52 Pain, unspecified: Secondary | ICD-10-CM | POA: Diagnosis not present

## 2021-11-21 DIAGNOSIS — F03C0 Unspecified dementia, severe, without behavioral disturbance, psychotic disturbance, mood disturbance, and anxiety: Secondary | ICD-10-CM | POA: Diagnosis not present

## 2021-11-21 DIAGNOSIS — Z8616 Personal history of COVID-19: Secondary | ICD-10-CM | POA: Diagnosis not present

## 2021-11-21 DIAGNOSIS — I10 Essential (primary) hypertension: Secondary | ICD-10-CM | POA: Diagnosis not present

## 2021-11-21 DIAGNOSIS — E785 Hyperlipidemia, unspecified: Secondary | ICD-10-CM | POA: Diagnosis not present

## 2021-11-21 DIAGNOSIS — G311 Senile degeneration of brain, not elsewhere classified: Secondary | ICD-10-CM | POA: Diagnosis not present

## 2021-11-21 DIAGNOSIS — M6281 Muscle weakness (generalized): Secondary | ICD-10-CM | POA: Diagnosis not present

## 2021-11-28 DIAGNOSIS — F03C0 Unspecified dementia, severe, without behavioral disturbance, psychotic disturbance, mood disturbance, and anxiety: Secondary | ICD-10-CM | POA: Diagnosis not present

## 2021-11-28 DIAGNOSIS — G311 Senile degeneration of brain, not elsewhere classified: Secondary | ICD-10-CM | POA: Diagnosis not present

## 2021-11-28 DIAGNOSIS — Z8616 Personal history of COVID-19: Secondary | ICD-10-CM | POA: Diagnosis not present

## 2021-11-28 DIAGNOSIS — M6281 Muscle weakness (generalized): Secondary | ICD-10-CM | POA: Diagnosis not present

## 2021-11-28 DIAGNOSIS — E785 Hyperlipidemia, unspecified: Secondary | ICD-10-CM | POA: Diagnosis not present

## 2021-11-28 DIAGNOSIS — I10 Essential (primary) hypertension: Secondary | ICD-10-CM | POA: Diagnosis not present

## 2021-12-04 DIAGNOSIS — J9611 Chronic respiratory failure with hypoxia: Secondary | ICD-10-CM | POA: Diagnosis not present

## 2021-12-04 DIAGNOSIS — R5381 Other malaise: Secondary | ICD-10-CM | POA: Diagnosis not present

## 2021-12-04 DIAGNOSIS — E559 Vitamin D deficiency, unspecified: Secondary | ICD-10-CM | POA: Diagnosis not present

## 2021-12-04 DIAGNOSIS — E785 Hyperlipidemia, unspecified: Secondary | ICD-10-CM | POA: Diagnosis not present

## 2021-12-04 DIAGNOSIS — F0280 Dementia in other diseases classified elsewhere without behavioral disturbance: Secondary | ICD-10-CM | POA: Diagnosis not present

## 2021-12-04 DIAGNOSIS — I10 Essential (primary) hypertension: Secondary | ICD-10-CM | POA: Diagnosis not present

## 2021-12-04 DIAGNOSIS — Z79899 Other long term (current) drug therapy: Secondary | ICD-10-CM | POA: Diagnosis not present

## 2021-12-04 DIAGNOSIS — J9621 Acute and chronic respiratory failure with hypoxia: Secondary | ICD-10-CM | POA: Diagnosis not present

## 2021-12-05 DIAGNOSIS — F03C0 Unspecified dementia, severe, without behavioral disturbance, psychotic disturbance, mood disturbance, and anxiety: Secondary | ICD-10-CM | POA: Diagnosis not present

## 2021-12-05 DIAGNOSIS — E785 Hyperlipidemia, unspecified: Secondary | ICD-10-CM | POA: Diagnosis not present

## 2021-12-05 DIAGNOSIS — G311 Senile degeneration of brain, not elsewhere classified: Secondary | ICD-10-CM | POA: Diagnosis not present

## 2021-12-05 DIAGNOSIS — Z8616 Personal history of COVID-19: Secondary | ICD-10-CM | POA: Diagnosis not present

## 2021-12-05 DIAGNOSIS — I10 Essential (primary) hypertension: Secondary | ICD-10-CM | POA: Diagnosis not present

## 2021-12-05 DIAGNOSIS — M6281 Muscle weakness (generalized): Secondary | ICD-10-CM | POA: Diagnosis not present

## 2021-12-08 ENCOUNTER — Other Ambulatory Visit (HOSPITAL_COMMUNITY)
Admission: RE | Admit: 2021-12-08 | Discharge: 2021-12-08 | Disposition: A | Discharge status: Home / Self Care | Payer: Medicare Other | Source: Skilled Nursing Facility | Attending: Internal Medicine | Admitting: Internal Medicine

## 2021-12-08 ENCOUNTER — Non-Acute Institutional Stay (SKILLED_NURSING_FACILITY): Payer: Medicare Other | Admitting: Adult Health

## 2021-12-08 ENCOUNTER — Encounter: Payer: Self-pay | Admitting: Adult Health

## 2021-12-08 DIAGNOSIS — J9611 Chronic respiratory failure with hypoxia: Secondary | ICD-10-CM | POA: Diagnosis not present

## 2021-12-08 DIAGNOSIS — K5909 Other constipation: Secondary | ICD-10-CM

## 2021-12-08 DIAGNOSIS — I10 Essential (primary) hypertension: Secondary | ICD-10-CM | POA: Diagnosis not present

## 2021-12-08 DIAGNOSIS — G301 Alzheimer's disease with late onset: Secondary | ICD-10-CM | POA: Diagnosis not present

## 2021-12-08 DIAGNOSIS — F02C0 Dementia in other diseases classified elsewhere, severe, without behavioral disturbance, psychotic disturbance, mood disturbance, and anxiety: Secondary | ICD-10-CM

## 2021-12-08 DIAGNOSIS — E785 Hyperlipidemia, unspecified: Secondary | ICD-10-CM

## 2021-12-08 DIAGNOSIS — F0280 Dementia in other diseases classified elsewhere without behavioral disturbance: Secondary | ICD-10-CM

## 2021-12-08 DIAGNOSIS — I700 Atherosclerosis of aorta: Secondary | ICD-10-CM

## 2021-12-08 HISTORY — DX: Dementia in other diseases classified elsewhere, unspecified severity, without behavioral disturbance, psychotic disturbance, mood disturbance, and anxiety: F02.80

## 2021-12-08 LAB — COMPREHENSIVE METABOLIC PANEL WITH GFR
ALT: 10 U/L (ref 0–44)
AST: 17 U/L (ref 15–41)
Albumin: 2.9 g/dL — ABNORMAL LOW (ref 3.5–5.0)
Alkaline Phosphatase: 58 U/L (ref 38–126)
Anion gap: 6 (ref 5–15)
BUN: 12 mg/dL (ref 8–23)
CO2: 27 mmol/L (ref 22–32)
Calcium: 8.7 mg/dL — ABNORMAL LOW (ref 8.9–10.3)
Chloride: 108 mmol/L (ref 98–111)
Creatinine, Ser: 0.79 mg/dL (ref 0.44–1.00)
GFR, Estimated: >60 mL/min (ref >60)
Glucose, Bld: 79 mg/dL (ref 70–99)
Potassium: 3.6 mmol/L (ref 3.5–5.1)
Sodium: 141 mmol/L (ref 135–145)
Total Bilirubin: 0.4 mg/dL (ref 0.3–1.2)
Total Protein: 5.7 g/dL — ABNORMAL LOW (ref 6.5–8.1)

## 2021-12-08 NOTE — Progress Notes (Signed)
?Location:  [place] ?Nursing Home Room Number: 149-D ?Place of Service:  SNF (31) ? ? ?CODE STATUS: DNR ? ?No Known Allergies ? ?Chief Complaint  ?Patient presents with  ? Acute Visit  ?  Follow up transfer   ? ? ?HPI: ? ?She is a 88 year old woman who has been transferred from another SNF. Her medical history includes: dementia; hypertension; chronic respiratory failure with hypoxia. There are no reports of agitation;no anxiety; no pain. She did sleep well last night. She will continue to be followed for her chronic illnesses including: Severe late onset alzheimer's disease without behavioral disturbance; psychotic disturbance; mood disturbance or anxiety:      Essential hypertension:  Aortic atherosclerosis Chronic constipation: ? ?Past Medical History:  ?Diagnosis Date  ? Allergic rhinitis 11/25/2016  ? Arthritis   ? mild, no known falls uses cane  ? Dementia ([place])   ? Hypertension   ? Neurofibroma 05/29/2017  ? Right breast skin lesion- pathology neurofibroma   ? ? ?Past Surgical History:  ?Procedure Laterality Date  ? MASS EXCISION Right 06/07/2017  ? Procedure: EXCISION SKIN LESION OF RIGHT BREAST;  Surgeon: Virl Cagey, MD;  Location: [facility];  Service: General;  Laterality: Right;  ? NO PAST SURGERIES    ? ? ?Social History  ? ?Socioeconomic History  ? Marital status: Widowed  ?  Spouse name: Not on file  ? Number of children: 0  ? Years of education: Not on file  ? Highest education level: Not on file  ?Occupational History  ? Not on file  ?Tobacco Use  ? Smoking status: Never  ? Smokeless tobacco: Never  ?Vaping Use  ? Vaping Use: Never used  ?Substance and Sexual Activity  ? Alcohol use: No  ? Drug use: No  ? Sexual activity: Not Currently  ?Other Topics Concern  ? Not on file  ?Social History Narrative  ? Not on file  ? ?Social Determinants of Health  ? ?Financial Resource Strain: Not on file  ?Food Insecurity: Not on file  ?Transportation Needs: Not on file  ?Physical Activity: Not on  file  ?Stress: Not on file  ?Social Connections: Not on file  ?Intimate Partner Violence: Not on file  ? ?Family History  ?Problem Relation Age of Onset  ? Diabetes Sister   ? Hypertension Sister   ? ? ? ? ?VITAL SIGNS ?BP 113/65   Pulse (!) 58   Temp 97.7 ?F (36.5 ?C)   Resp 20   Ht '5\' 2"'$  (1.575 m)   Wt 147 lb 12.8 oz (67 kg)   SpO2 99%   BMI 27.03 kg/m?  ? ?Outpatient Encounter Medications as of 12/08/2021  ?Medication Sig  ? acetaminophen (TYLENOL) 325 MG tablet Take 650 mg by mouth every 6 (six) hours as needed.  ? albuterol (PROVENTIL) (2.5 MG/3ML) 0.083% nebulizer solution Take 2.5 mg by nebulization every 4 (four) hours as needed for wheezing or shortness of breath.  ? amLODipine (NORVASC) 2.5 MG tablet Take 1 tablet (2.5 mg total) by mouth daily.  ? aspirin EC 81 MG tablet Take 1 tablet (81 mg total) by mouth daily.  ? Calcium Carb-Cholecalciferol (CALCIUM 600 + D) 600-5 MG-MCG TABS Take 1 tablet by mouth daily.  ? Multiple Vitamin (MULTIVITAMIN WITH MINERALS) TABS tablet 18 mg iron-400 mcg-25 mcg; oral ?Once A Day  ? NON FORMULARY Diet: Mechanical soft  ? polyethylene glycol (MIRALAX / GLYCOLAX) 17 g packet Take 17 g by mouth daily.  ?  polyvinyl alcohol (ARTIFICIAL TEARS) 1.4 % ophthalmic solution Place 1 drop into both eyes in the morning and at bedtime. Wait at least 5 minutes between multiple drops in same eye.  ? ?No facility-administered encounter medications on file as of 12/08/2021.  ? ? ? ?SIGNIFICANT DIAGNOSTIC EXAMS ? ?LABS REVIEWED:  ? ?02-15-21: wbc 8.4; hgb 11.4; hct 36.1; mcv 98,1 plt 143 ?12-08-21: glucose 79; bun 12; creat 0.79; k+ 3.6; na++ 141; ca 8.7; GFR>60; protein 5.7 albumin 2.9  ? ?Review of Systems  ?Unable to perform ROS: Dementia  ? ?Physical Exam ?Constitutional:   ?   General: She is not in acute distress. ?   Appearance: She is well-developed. She is not diaphoretic.  ?Neck:  ?   Thyroid: No thyromegaly.  ?Cardiovascular:  ?   Rate and Rhythm: Normal rate and regular rhythm.   ?   Pulses: Normal pulses.  ?   Heart sounds: Normal heart sounds.  ?Pulmonary:  ?   Effort: Pulmonary effort is normal. No respiratory distress.  ?   Breath sounds: Normal breath sounds.  ?Abdominal:  ?   General: Bowel sounds are normal. There is no distension.  ?   Palpations: Abdomen is soft.  ?   Tenderness: There is no abdominal tenderness.  ?Musculoskeletal:     ?   General: Normal range of motion.  ?   Cervical back: Neck supple.  ?   Right lower leg: No edema.  ?   Left lower leg: No edema.  ?Lymphadenopathy:  ?   Cervical: No cervical adenopathy.  ?Skin: ?   General: Skin is warm and dry.  ?Neurological:  ?   Mental Status: She is alert. Mental status is at baseline.  ?Psychiatric:     ?   Mood and Affect: Mood normal.  ? ? ? ?ASSESSMENT/ PLAN: ? ?TODAY ? ?Severe late onset alzheimer's disease without behavioral disturbance; psychotic disturbance; mood disturbance or anxiety: her weight is 147 pounds; she is off medications will monitor  ? ?2. Essential hypertension: b/p 113/65 will continue norvasc 2.5 mg daily; if her readings remain in this range may need to consider stopping this medication ? ?3. Aortic atherosclerosis (ct 11-16-16) asa 81 mg daily  ? ?4. Chronic constipation: will continue miralax daily  ? ?5. Hyperlipidemia LDL goal <130: due to her advanced age; will stop lipitor  ? ?6. Chronic respiratory failure with hypoxia: will continue albuterol neb every 6 hours as needed ? ? ? ? ?Ok Edwards NP ?[facility]  ? call [phone]  ? ?

## 2021-12-13 DIAGNOSIS — I10 Essential (primary) hypertension: Secondary | ICD-10-CM | POA: Diagnosis not present

## 2021-12-13 DIAGNOSIS — Z8616 Personal history of COVID-19: Secondary | ICD-10-CM | POA: Diagnosis not present

## 2021-12-13 DIAGNOSIS — G311 Senile degeneration of brain, not elsewhere classified: Secondary | ICD-10-CM | POA: Diagnosis not present

## 2021-12-13 DIAGNOSIS — E785 Hyperlipidemia, unspecified: Secondary | ICD-10-CM | POA: Diagnosis not present

## 2021-12-13 DIAGNOSIS — M6281 Muscle weakness (generalized): Secondary | ICD-10-CM | POA: Diagnosis not present

## 2021-12-13 DIAGNOSIS — F03C0 Unspecified dementia, severe, without behavioral disturbance, psychotic disturbance, mood disturbance, and anxiety: Secondary | ICD-10-CM | POA: Diagnosis not present

## 2021-12-18 DIAGNOSIS — R52 Pain, unspecified: Secondary | ICD-10-CM | POA: Diagnosis not present

## 2021-12-18 DIAGNOSIS — J9621 Acute and chronic respiratory failure with hypoxia: Secondary | ICD-10-CM | POA: Diagnosis not present

## 2021-12-18 DIAGNOSIS — R4702 Dysphasia: Secondary | ICD-10-CM | POA: Diagnosis not present

## 2021-12-18 DIAGNOSIS — I10 Essential (primary) hypertension: Secondary | ICD-10-CM | POA: Diagnosis not present

## 2021-12-18 DIAGNOSIS — R278 Other lack of coordination: Secondary | ICD-10-CM | POA: Diagnosis not present

## 2021-12-18 DIAGNOSIS — M6281 Muscle weakness (generalized): Secondary | ICD-10-CM | POA: Diagnosis not present

## 2021-12-18 DIAGNOSIS — R0602 Shortness of breath: Secondary | ICD-10-CM | POA: Diagnosis not present

## 2021-12-18 DIAGNOSIS — G311 Senile degeneration of brain, not elsewhere classified: Secondary | ICD-10-CM | POA: Diagnosis not present

## 2021-12-18 DIAGNOSIS — R2689 Other abnormalities of gait and mobility: Secondary | ICD-10-CM | POA: Diagnosis not present

## 2021-12-18 DIAGNOSIS — F03C0 Unspecified dementia, severe, without behavioral disturbance, psychotic disturbance, mood disturbance, and anxiety: Secondary | ICD-10-CM | POA: Diagnosis not present

## 2021-12-18 DIAGNOSIS — Z8616 Personal history of COVID-19: Secondary | ICD-10-CM | POA: Diagnosis not present

## 2021-12-18 DIAGNOSIS — H04123 Dry eye syndrome of bilateral lacrimal glands: Secondary | ICD-10-CM | POA: Diagnosis not present

## 2021-12-18 DIAGNOSIS — E785 Hyperlipidemia, unspecified: Secondary | ICD-10-CM | POA: Diagnosis not present

## 2021-12-20 DIAGNOSIS — E785 Hyperlipidemia, unspecified: Secondary | ICD-10-CM | POA: Diagnosis not present

## 2021-12-20 DIAGNOSIS — Z8616 Personal history of COVID-19: Secondary | ICD-10-CM | POA: Diagnosis not present

## 2021-12-20 DIAGNOSIS — I10 Essential (primary) hypertension: Secondary | ICD-10-CM | POA: Diagnosis not present

## 2021-12-20 DIAGNOSIS — M6281 Muscle weakness (generalized): Secondary | ICD-10-CM | POA: Diagnosis not present

## 2021-12-20 DIAGNOSIS — F03C0 Unspecified dementia, severe, without behavioral disturbance, psychotic disturbance, mood disturbance, and anxiety: Secondary | ICD-10-CM | POA: Diagnosis not present

## 2021-12-20 DIAGNOSIS — G311 Senile degeneration of brain, not elsewhere classified: Secondary | ICD-10-CM | POA: Diagnosis not present

## 2021-12-22 ENCOUNTER — Non-Acute Institutional Stay (SKILLED_NURSING_FACILITY): Payer: Medicare Other | Admitting: Adult Health

## 2021-12-22 ENCOUNTER — Encounter: Payer: Self-pay | Admitting: Adult Health

## 2021-12-22 DIAGNOSIS — J9611 Chronic respiratory failure with hypoxia: Secondary | ICD-10-CM

## 2021-12-22 DIAGNOSIS — F02C0 Dementia in other diseases classified elsewhere, severe, without behavioral disturbance, psychotic disturbance, mood disturbance, and anxiety: Secondary | ICD-10-CM | POA: Diagnosis not present

## 2021-12-22 DIAGNOSIS — G301 Alzheimer's disease with late onset: Secondary | ICD-10-CM

## 2021-12-22 DIAGNOSIS — I700 Atherosclerosis of aorta: Secondary | ICD-10-CM

## 2021-12-22 NOTE — Progress Notes (Signed)
?Location:  [place] ?Nursing Home Room Number: 149-D ?Place of Service:  SNF (31) ? ? ?CODE STATUS: DNR ? ?No Known Allergies ? ?Chief Complaint  ?Patient presents with  ? Acute Visit  ?  Care plan meeting  ? ? ?HPI: ? ?We have come together for her care plan meeting. Family present  BIMS 4/15 mood 0/30. She is nonambulatory no falls. She requires limited to extensive assist with adls. Is frequently incontinent of bladder and bowel. Dietary:  regular diet; feeds self slowly; fair appetite; weight is 143 pounds; will spit out food at times . Therapy ;none at this time.  She continues to be followed for her chronic illnesses including: Aortic atherosclerosis  Chronic respiratory failure with hypoxia  Severe late onset alzheimer's dementia without behavioral disturbance psychotic disturbance mood disturbance or anxiety ? ?Past Medical History:  ?Diagnosis Date  ? Allergic rhinitis 11/25/2016  ? Arthritis   ? mild, no known falls uses cane  ? Dementia ([place])   ? Hypertension   ? Neurofibroma 05/29/2017  ? Right breast skin lesion- pathology neurofibroma   ? ? ?Past Surgical History:  ?Procedure Laterality Date  ? MASS EXCISION Right 06/07/2017  ? Procedure: EXCISION SKIN LESION OF RIGHT BREAST;  Surgeon: Virl Cagey, MD;  Location: [facility];  Service: General;  Laterality: Right;  ? NO PAST SURGERIES    ? ? ?Social History  ? ?Socioeconomic History  ? Marital status: Widowed  ?  Spouse name: Not on file  ? Number of children: 0  ? Years of education: Not on file  ? Highest education level: Not on file  ?Occupational History  ? Not on file  ?Tobacco Use  ? Smoking status: Never  ? Smokeless tobacco: Never  ?Vaping Use  ? Vaping Use: Never used  ?Substance and Sexual Activity  ? Alcohol use: No  ? Drug use: No  ? Sexual activity: Not Currently  ?Other Topics Concern  ? Not on file  ?Social History Narrative  ? Not on file  ? ?Social Determinants of Health  ? ?Financial Resource Strain: Not on file  ?Food  Insecurity: Not on file  ?Transportation Needs: Not on file  ?Physical Activity: Not on file  ?Stress: Not on file  ?Social Connections: Not on file  ?Intimate Partner Violence: Not on file  ? ?Family History  ?Problem Relation Age of Onset  ? Diabetes Sister   ? Hypertension Sister   ? ? ? ? ?VITAL SIGNS ?BP (!) 144/80   Pulse (!) 58   Temp (!) 97.3 ?F (36.3 ?C)   Resp 20   Ht '5\' 2"'$  (1.575 m)   Wt 143 lb 9.6 oz (65.1 kg)   SpO2 100%   BMI 26.26 kg/m?  ? ?Outpatient Encounter Medications as of 12/22/2021  ?Medication Sig  ? acetaminophen (TYLENOL) 325 MG tablet Take 650 mg by mouth every 6 (six) hours as needed.  ? albuterol (PROVENTIL) (2.5 MG/3ML) 0.083% nebulizer solution Take 2.5 mg by nebulization every 4 (four) hours as needed for wheezing or shortness of breath.  ? amLODipine (NORVASC) 2.5 MG tablet Take 1 tablet (2.5 mg total) by mouth daily.  ? aspirin EC 81 MG tablet Take 1 tablet (81 mg total) by mouth daily.  ? Calcium Carb-Cholecalciferol (CALCIUM 600+D3) 600-10 MG-MCG TABS Take 400 Units by mouth daily.  ? NON FORMULARY Diet: Mechanical soft  ? polyethylene glycol (MIRALAX / GLYCOLAX) 17 g packet Take 17 g by mouth daily.  ? polyvinyl  alcohol (LIQUIFILM TEARS) 1.4 % ophthalmic solution Place 1 drop into both eyes in the morning and at bedtime. Wait at least 5 minutes between multiple drops in same eye.  ? [DISCONTINUED] Calcium Carb-Cholecalciferol (CALCIUM 600 + D) 600-5 MG-MCG TABS Take 1 tablet by mouth daily.  ? [DISCONTINUED] Multiple Vitamin (MULTIVITAMIN WITH MINERALS) TABS tablet 18 mg iron-400 mcg-25 mcg; oral ?Once A Day  ? ?No facility-administered encounter medications on file as of 12/22/2021.  ? ? ? ?SIGNIFICANT DIAGNOSTIC EXAMS ? ? ?LABS REVIEWED: PREVIOUS  ? ?02-15-21: wbc 8.4; hgb 11.4; hct 36.1; mcv 98,1 plt 143 ?12-08-21: glucose 79; bun 12; creat 0.79; k+ 3.6; na++ 141; ca 8.7; GFR>60; protein 5.7 albumin 2.9  ? ?NO NEW LABS.  ? ?Review of Systems  ?Unable to perform ROS: Dementia   ? ?Physical Exam ?Constitutional:   ?   General: She is not in acute distress. ?   Appearance: She is well-developed. She is not diaphoretic.  ?Neck:  ?   Thyroid: No thyromegaly.  ?Cardiovascular:  ?   Rate and Rhythm: Normal rate and regular rhythm.  ?   Pulses: Normal pulses.  ?   Heart sounds: Normal heart sounds.  ?Pulmonary:  ?   Effort: Pulmonary effort is normal. No respiratory distress.  ?   Breath sounds: Normal breath sounds.  ?Abdominal:  ?   General: Bowel sounds are normal. There is no distension.  ?   Palpations: Abdomen is soft.  ?   Tenderness: There is no abdominal tenderness.  ?Musculoskeletal:     ?   General: Normal range of motion.  ?   Cervical back: Neck supple.  ?   Right lower leg: No edema.  ?   Left lower leg: No edema.  ?Lymphadenopathy:  ?   Cervical: No cervical adenopathy.  ?Skin: ?   General: Skin is warm and dry.  ?Neurological:  ?   Mental Status: She is alert. Mental status is at baseline.  ?Psychiatric:     ?   Mood and Affect: Mood normal.  ? ? ? ?ASSESSMENT/ PLAN: ? ?TODAY ? ?Aortic atherosclerosis ?Chronic respiratory failure with hypoxia ?Severe late onset alzheimer's dementia without behavioral disturbance psychotic disturbance mood disturbance or anxiety ? ?Will continue current medications ?Will continue current plan of care ?Will continue to monitor her status.  ? ? ?Time spent with patient: 40 minutes: medications; plan of care.  ? ? ?Ok Edwards NP ?[facility]  ?call [phone]  ? ?

## 2021-12-25 ENCOUNTER — Non-Acute Institutional Stay (SKILLED_NURSING_FACILITY): Payer: Medicare Other | Admitting: Internal Medicine

## 2021-12-25 ENCOUNTER — Encounter: Payer: Self-pay | Admitting: Adult Health

## 2021-12-25 ENCOUNTER — Encounter: Payer: Self-pay | Admitting: Internal Medicine

## 2021-12-25 DIAGNOSIS — G301 Alzheimer's disease with late onset: Secondary | ICD-10-CM | POA: Diagnosis not present

## 2021-12-25 DIAGNOSIS — I10 Essential (primary) hypertension: Secondary | ICD-10-CM | POA: Diagnosis not present

## 2021-12-25 DIAGNOSIS — E785 Hyperlipidemia, unspecified: Secondary | ICD-10-CM | POA: Diagnosis not present

## 2021-12-25 DIAGNOSIS — F02C0 Dementia in other diseases classified elsewhere, severe, without behavioral disturbance, psychotic disturbance, mood disturbance, and anxiety: Secondary | ICD-10-CM

## 2021-12-25 NOTE — Assessment & Plan Note (Addendum)
She is on low-dose amlodipine.  Blood pressure range has been 113/65 up to the present value of 162/74.  She has exhibited bradycardia intermittently.  Appropriate antihypertensive adjustment will be based on the average rather than outliers. ?Low-dose selective beta-blocker therapy can be at considered if bradycardia is not persistent or progressive. ?

## 2021-12-25 NOTE — Assessment & Plan Note (Addendum)
Atorvastatin has been discontinued; statin is not indicated @ 102. ?

## 2021-12-25 NOTE — Progress Notes (Signed)
? ?  NURSING HOME LOCATION: [place] ?ROOM NUMBER: 149D ? ?CODE STATUS:  DNR ? ?PCP: Tula Nakayama, MD ? ?This is a comprehensive admission note to this SNFperformed on this date less than 30 days from date of admission. ?Included are preadmission medical/surgical history; reconciled medication list; family history; social history and comprehensive review of systems.  ?Corrections and additions to the records were documented. Comprehensive physical exam was also performed. Additionally a clinical summary was entered for each active diagnosis pertinent to this admission in the Problem List to enhance continuity of care. ? ?HPI: She has been transferred from another SNF for permanent residency here as her as her sister is a permanent resident here.  She has a diagnosis of severe, late onset Alzheimer's without behavioral disturbance, psychotic disturbance, mood disturbance or anxiety. ? ?Labs were updated 12/08/2021.  Creatinine was 0.79 with a GFR greater than 60 indicating CKD stage II.  Total protein was 5.7 and albumin 2.9, indicating significant protein/caloric malnutrition.  Mild hypocalcemia was present with a value of 8.7. ?The last CBC on record was 02/15/2021 with normochromic, normocytic anemia with H/H of 11.4/36.1.  Mild thrombocytopenia was present with a value 143,000. ? ?Past medical and surgical history: Includes allergic rhinitis, degenerative joint disease, essential hypertension, aortic atherosclerosis, history of neurofibroma of the breast, and dementia. ? ?Social history: Nondrinker; non-smoker. ? ?Family history: Non contributory due to advanced age. ?  ?Review of systems: Clinical neurocognitive deficits made validity of responses questionable , preventing ROS completion.  She made the comment "thank God nothing is wrong with me".  When asked where she had been living before, her response was "with my people".  She identified a young man in a photo with her on the bulletin  board as "someone I went to school with".  She then began to confabulate about "my daddy had a spring and took water to people".   ? ?Physical exam:  ?Pertinent or positive findings: Facies are blank although she does get a quizzical look on her face when queried.  Dentition is surprisingly good.  Heart sounds are distant.  She has low-grade musical rhonchi with expiration on the left.  Pedal pulses are decreased.  She has 1/2+ edema at the sock line.  When asked to hold up 3 fingers on the right hand, she held up 2.  She has dense hyperpigmentation over the right distal ankle.  Limbs are thin and there is interosseous wasting present. ? ?General appearance: no acute distress, increased work of breathing is present.   ?Lymphatic: No lymphadenopathy about the head, neck, axilla. ?Eyes: No conjunctival inflammation or lid edema is present. There is no scleral icterus. ?Ears:  External ear exam shows no significant lesions or deformities.   ?Nose:  External nasal examination shows no deformity or inflammation. Nasal mucosa are pink and moist without lesions, exudates ?Oral exam: Lips and gums are healthy appearing.There is no oropharyngeal erythema or exudate. ?Neck:  No thyromegaly, masses, tenderness noted.    ?Heart:  No gallop, murmur, click, rub.  ?Lungs:  without wheezes, rales, rubs. ?Abdomen: Bowel sounds are normal.  Abdomen is soft and nontender with no organomegaly, hernias, masses. ?GU: Deferred  ?Extremities:  No cyanosis, clubbing. ?Neurologic exam:  Balance, Rhomberg, finger to nose testing could not be completed due to clinical state ?Skin: Warm & dry w/o tenting. ? ?See clinical summary under each active problem in the Problem List with associated updated therapeutic plan ? ?

## 2021-12-25 NOTE — Progress Notes (Signed)
This encounter was created in error - please disregard.

## 2021-12-25 NOTE — Patient Instructions (Signed)
See assessment and plan under each diagnosis in the problem list and acutely for this visit 

## 2021-12-25 NOTE — Progress Notes (Signed)
?  Location:  [place] ?Nursing Home Room Number: 149-D ?Place of Service:  SNF (31) ? ? ?CODE STATUS: DNR ? ?No Known Allergies ? ?Chief Complaint  ?Patient presents with  ? Medicare Wellness  ?  Annual Wellness Visit  ? ? ?HPI: ? ? ? ?Past Medical History:  ?Diagnosis Date  ? Allergic rhinitis 11/25/2016  ? Arthritis   ? mild, no known falls uses cane  ? Dementia ([place])   ? Hypertension   ? Neurofibroma 05/29/2017  ? Right breast skin lesion- pathology neurofibroma   ? ? ?Past Surgical History:  ?Procedure Laterality Date  ? MASS EXCISION Right 06/07/2017  ? Procedure: EXCISION SKIN LESION OF RIGHT BREAST;  Surgeon: Virl Cagey, MD;  Location: [facility];  Service: General;  Laterality: Right;  ? NO PAST SURGERIES    ? ? ?Social History  ? ?Socioeconomic History  ? Marital status: Widowed  ?  Spouse name: Not on file  ? Number of children: 0  ? Years of education: Not on file  ? Highest education level: Not on file  ?Occupational History  ? Not on file  ?Tobacco Use  ? Smoking status: Never  ? Smokeless tobacco: Never  ?Vaping Use  ? Vaping Use: Never used  ?Substance and Sexual Activity  ? Alcohol use: No  ? Drug use: No  ? Sexual activity: Not Currently  ?Other Topics Concern  ? Not on file  ?Social History Narrative  ? Not on file  ? ?Social Determinants of Health  ? ?Financial Resource Strain: Not on file  ?Food Insecurity: Not on file  ?Transportation Needs: Not on file  ?Physical Activity: Not on file  ?Stress: Not on file  ?Social Connections: Not on file  ?Intimate Partner Violence: Not on file  ? ?Family History  ?Problem Relation Age of Onset  ? Diabetes Sister   ? Hypertension Sister   ? ? ? ? ?VITAL SIGNS ?BP (!) 162/74   Pulse (!) 58   Temp (!) 97.3 ?F (36.3 ?C)   Resp 20   Ht '5\' 2"'$  (1.575 m)   Wt 143 lb 9.6 oz (65.1 kg)   SpO2 100%   BMI 26.26 kg/m?  ? ?Outpatient Encounter Medications as of 12/25/2021  ?Medication Sig  ? acetaminophen (TYLENOL) 325 MG tablet Take 650 mg by mouth every  6 (six) hours as needed.  ? albuterol (PROVENTIL) (2.5 MG/3ML) 0.083% nebulizer solution Take 2.5 mg by nebulization every 4 (four) hours as needed for wheezing or shortness of breath.  ? amLODipine (NORVASC) 2.5 MG tablet Take 1 tablet (2.5 mg total) by mouth daily.  ? aspirin EC 81 MG tablet Take 1 tablet (81 mg total) by mouth daily.  ? Calcium Carb-Cholecalciferol 600-10 MG-MCG TABS Take 400 Units by mouth daily.  ? NON FORMULARY Diet: Dysphagia 1 (puree) with thin liquids  ? polyethylene glycol (MIRALAX / GLYCOLAX) 17 g packet Take 17 g by mouth daily.  ? polyvinyl alcohol (LIQUIFILM TEARS) 1.4 % ophthalmic solution Place 1 drop into both eyes in the morning and at bedtime. Wait at least 5 minutes between multiple drops in same eye.  ? ?No facility-administered encounter medications on file as of 12/25/2021.  ? ? ? ?SIGNIFICANT DIAGNOSTIC EXAMS ? ? ? ? ? ? ?ASSESSMENT/ PLAN: ? ? ? ? ?Ok Edwards NP ?[facility]  ?Contact [phone] Monday through Friday 8am- 5pm  ?After hours call [phone]  ? ?

## 2021-12-25 NOTE — Assessment & Plan Note (Signed)
BIMS (Brief Interview for Mental Status) score varies from 00-15 ,15 being normal ?Her score is 4. ?She could give no history and had difficulty following commands.  She confabulated about the remote past. ?

## 2021-12-27 ENCOUNTER — Non-Acute Institutional Stay (SKILLED_NURSING_FACILITY): Payer: Medicare Other | Admitting: Adult Health

## 2021-12-27 ENCOUNTER — Encounter: Payer: Self-pay | Admitting: Adult Health

## 2021-12-27 DIAGNOSIS — E785 Hyperlipidemia, unspecified: Secondary | ICD-10-CM | POA: Diagnosis not present

## 2021-12-27 DIAGNOSIS — I10 Essential (primary) hypertension: Secondary | ICD-10-CM | POA: Diagnosis not present

## 2021-12-27 DIAGNOSIS — M6281 Muscle weakness (generalized): Secondary | ICD-10-CM | POA: Diagnosis not present

## 2021-12-27 DIAGNOSIS — Z0000 Encounter for general adult medical examination without abnormal findings: Secondary | ICD-10-CM

## 2021-12-27 DIAGNOSIS — Z8616 Personal history of COVID-19: Secondary | ICD-10-CM | POA: Diagnosis not present

## 2021-12-27 DIAGNOSIS — F03C0 Unspecified dementia, severe, without behavioral disturbance, psychotic disturbance, mood disturbance, and anxiety: Secondary | ICD-10-CM | POA: Diagnosis not present

## 2021-12-27 DIAGNOSIS — G311 Senile degeneration of brain, not elsewhere classified: Secondary | ICD-10-CM | POA: Diagnosis not present

## 2021-12-27 NOTE — Progress Notes (Signed)
? ?Subjective:  ? Katrina Berry is a 88 y.o. female who presents for Medicare Annual (Subsequent) preventive examination. ? ?Review of Systems    ?Review of Systems  ?Unable to perform ROS: Dementia  ? ?Cardiac Risk Factors include: advanced age (>84mn, >>71women);sedentary lifestyle ? ?   ?Objective:  ?  ?Today's Vitals  ? 12/27/21 1157  ?BP: 140/88  ?Pulse: 67  ?Temp: (!) 97.3 ?F (36.3 ?C)  ?Weight: 143 lb 9.6 oz (65.1 kg)  ?Height: '5\' 2"'$  (1.575 m)  ? ?Body mass index is 26.26 kg/m?. ? ? ?  12/27/2021  ?  9:17 AM 12/25/2021  ?  8:48 AM 12/22/2021  ?  9:28 AM 12/08/2021  ?  8:24 AM 02/15/2021  ?  5:49 AM 02/12/2021  ?  2:00 AM 03/31/2018  ?  1:35 PM  ?Advanced Directives  ?Does Patient Have a Medical Advance Directive? Yes Yes Yes Yes No Unable to assess, patient is non-responsive or altered mental status No  ?Type of Advance Directive Out of facility DNR (pink MOST or yellow form) Out of facility DNR (pink MOST or yellow form) Out of facility DNR (pink MOST or yellow form) Out of facility DNR (pink MOST or yellow form)     ?Does patient want to make changes to medical advance directive? No - Patient declined No - Patient declined No - Patient declined No - Patient declined     ?Would patient like information on creating a medical advance directive?     No - Patient declined  No - Patient declined  ?Pre-existing out of facility DNR order (yellow form or pink MOST form) Yellow form placed in chart (order not valid for inpatient use) Yellow form placed in chart (order not valid for inpatient use) Yellow form placed in chart (order not valid for inpatient use) Yellow form placed in chart (order not valid for inpatient use)     ? ? ?Current Medications (verified) ?Outpatient Encounter Medications as of 12/27/2021  ?Medication Sig  ? acetaminophen (TYLENOL) 325 MG tablet Take 650 mg by mouth every 6 (six) hours as needed.  ? albuterol (PROVENTIL) (2.5 MG/3ML) 0.083% nebulizer solution Take 2.5 mg by nebulization every 4  (four) hours as needed for wheezing or shortness of breath.  ? amLODipine (NORVASC) 2.5 MG tablet Take 1 tablet (2.5 mg total) by mouth daily.  ? aspirin EC 81 MG tablet Take 1 tablet (81 mg total) by mouth daily.  ? Calcium Carb-Cholecalciferol 600-10 MG-MCG TABS Take 400 Units by mouth daily.  ? NON FORMULARY Diet: Dysphagia 1 (puree) with thin liquids  ? polyethylene glycol (MIRALAX / GLYCOLAX) 17 g packet Take 17 g by mouth daily.  ? polyvinyl alcohol (LIQUIFILM TEARS) 1.4 % ophthalmic solution Place 1 drop into both eyes in the morning and at bedtime. Wait at least 5 minutes between multiple drops in same eye.  ? ?No facility-administered encounter medications on file as of 12/27/2021.  ? ? ?Allergies (verified) ?Patient has no known allergies.  ? ?History: ?Past Medical History:  ?Diagnosis Date  ? Allergic rhinitis 11/25/2016  ? Arthritis   ? mild, no known falls uses cane  ? Dementia ([place]   ? Hypertension   ? Neurofibroma 05/29/2017  ? Right breast skin lesion- pathology neurofibroma   ? ?Past Surgical History:  ?Procedure Laterality Date  ? MASS EXCISION Right 06/07/2017  ? Procedure: EXCISION SKIN LESION OF RIGHT BREAST;  Surgeon: BVirl Cagey MD;  Location: [facility];  Service: General;  Laterality: Right;  ? NO PAST SURGERIES    ? ?Family History  ?Problem Relation Age of Onset  ? Diabetes Sister   ? Hypertension Sister   ? ?Social History  ? ?Socioeconomic History  ? Marital status: Widowed  ?  Spouse name: Not on file  ? Number of children: 0  ? Years of education: Not on file  ? Highest education level: Not on file  ?Occupational History  ? Not on file  ?Tobacco Use  ? Smoking status: Never  ? Smokeless tobacco: Never  ?Vaping Use  ? Vaping Use: Never used  ?Substance and Sexual Activity  ? Alcohol use: No  ? Drug use: No  ? Sexual activity: Not Currently  ?Other Topics Concern  ? Not on file  ?Social History Narrative  ? Not on file  ? ?Social Determinants of Health  ? ?Financial Resource Strain:  Not on file  ?Food Insecurity: Not on file  ?Transportation Needs: Not on file  ?Physical Activity: Not on file  ?Stress: Not on file  ?Social Connections: Not on file  ? ? ?Tobacco Counseling ?Counseling given: Not Answered ? ? ?Clinical Intake: ? ?Pre-visit preparation completed: Yes ? ?Pain : Faces ?Faces Pain Scale: No hurt ? ?Faces Pain Scale: No hurt ? ?BMI - recorded: 26.26 ?Nutritional Status: BMI 25 -29 Overweight ?Nutritional Risks: Unintentional weight loss ?Diabetes: No ? ?How often do you need to have someone help you when you read instructions, pamphlets, or other written materials from your doctor or pharmacy?: 5 - Always ? ?Diabetic?no ? ?Interpreter Needed?: No ? ?  ? ? ?Activities of Daily Living ? ?  12/27/2021  ? 12:04 PM 02/15/2021  ?  5:51 AM  ?In your present state of health, do you have any difficulty performing the following activities:  ?Hearing? 0 0  ?Vision? 0 0  ?Difficulty concentrating or making decisions? 1 1  ?Walking or climbing stairs? 1 1  ?Dressing or bathing? 1 1  ?Doing errands, shopping? 1   ?Preparing Food and eating ? Y   ?Using the Toilet? Y   ?In the past six months, have you accidently leaked urine? Y   ?Do you have problems with loss of bowel control? Y   ?Managing your Medications? Y   ?Managing your Finances? Y   ?Housekeeping or managing your Housekeeping? Y   ? ? ?Patient Care Team: ?Fayrene Helper, MD as PCP - General (Family Medicine) ? ?Indicate any recent Medical Services you may have received from other than [facility] providers in the past year (date may be approximate). ? ?   ?Assessment:  ? This is a routine wellness examination for [place]. ? ?Hearing/Vision screen ?No results found. ? ?Dietary issues and exercise activities discussed: ?Current Exercise Habits: The patient does not participate in regular exercise at present ? ? Goals Addressed   ? ?  ?  ?  ?  ? This Visit's Progress  ?  Absence of Fall and Fall-Related Injury     ?  Evidence-based guidance:   ?Assess fall risk using a validated tool when available. Consider balance and gait impairment, muscle weakness, diminished vision or hearing, environmental hazards, presence of urinary or bowel urgency and/or incontinence.  ?Communicate fall injury risk to interprofessional healthcare team.  ?Develop a fall prevention plan with the patient and family.  ?Promote use of personal vision and auditory aids.  ?Promote reorientation, appropriate sensory stimulation, and routines to decrease risk of fall when changes in mental status are present.  ?  Assess assistance level required for safe and effective self-care; consider referral for home care.  ?Encourage physical activity, such as performance of self-care at highest level of ability, strength and balance exercise program, and provision of appropriate assistive devices; refer to rehabilitation therapy.  ?Refer to community-based fall prevention program where available.  ?If fall occurs, determine the cause and revise fall injury prevention plan.  ?Regularly review medication contribution to fall risk; consider risk related to polypharmacy and age.  ?Refer to pharmacist for consultation when concerns about medications are revealed.  ?Balance adequate pain management with potential for oversedation.  ?Provide guidance related to environmental modifications.  ?Consider supplementation with Vitamin D.   ?Notes:  ?  ?  DIET - INCREASE WATER INTAKE     ?  General - Client will not be readmitted within 30 days (C-SNP)     ? ?  ? ?Depression Screen ? ?  12/27/2021  ? 12:03 PM 06/08/2020  ?  1:26 PM 09/14/2019  ?  2:28 PM 04/29/2019  ?  2:15 PM 01/15/2019  ?  8:40 AM 06/25/2018  ?  2:24 PM 03/31/2018  ?  1:30 PM  ?PHQ 2/9 Scores  ?PHQ - 2 Score  0 0 0 0 0 0  ?Exception Documentation Other- indicate reason in comment box        ?Not completed dementia        ?  ?Fall Risk ? ?  12/27/2021  ? 12:03 PM 06/08/2020  ?  1:25 PM 12/30/2019  ?  3:40 PM 09/14/2019  ?  2:28 PM 04/29/2019  ?  2:15 PM   ?Fall Risk   ?Falls in the past year? 0 1 0 1 1  ?Number falls in past yr: 0 0 0 1 1  ?Injury with Fall? 0 1 0 1 1  ?Risk for fall due to : Impaired balance/gait;Impaired mobility      ? ? ?FALL RISK PRE

## 2021-12-27 NOTE — Patient Instructions (Signed)
?  Katrina Berry , ?Thank you for taking time to come for your Medicare Wellness Visit. I appreciate your ongoing commitment to your health goals. Please review the following plan we discussed and let me know if I can assist you in the future.  ? ?These are the goals we discussed: ? Goals   ? ?  Absence of Fall and Fall-Related Injury   ?  Evidence-based guidance:  ?Assess fall risk using a validated tool when available. Consider balance and gait impairment, muscle weakness, diminished vision or hearing, environmental hazards, presence of urinary or bowel urgency and/or incontinence.  ?Communicate fall injury risk to interprofessional healthcare team.  ?Develop a fall prevention plan with the patient and family.  ?Promote use of personal vision and auditory aids.  ?Promote reorientation, appropriate sensory stimulation, and routines to decrease risk of fall when changes in mental status are present.  ?Assess assistance level required for safe and effective self-care; consider referral for home care.  ?Encourage physical activity, such as performance of self-care at highest level of ability, strength and balance exercise program, and provision of appropriate assistive devices; refer to rehabilitation therapy.  ?Refer to community-based fall prevention program where available.  ?If fall occurs, determine the cause and revise fall injury prevention plan.  ?Regularly review medication contribution to fall risk; consider risk related to polypharmacy and age.  ?Refer to pharmacist for consultation when concerns about medications are revealed.  ?Balance adequate pain management with potential for oversedation.  ?Provide guidance related to environmental modifications.  ?Consider supplementation with Vitamin D.   ?Notes:  ?  ?  DIET - INCREASE WATER INTAKE   ?  General - Client will not be readmitted within 30 days (C-SNP)   ? ?  ?  ?This is a list of the screening recommended for you and due dates:  ?Health Maintenance  ?Topic  Date Due  ? DEXA scan (bone density measurement)  Never done  ? COVID-19 Vaccine (3 - Moderna risk series) 12/14/2020  ? Zoster (Shingles) Vaccine (2 of 2) 02/09/2022  ? Flu Shot  03/20/2022  ? Tetanus Vaccine  07/06/2025  ? Pneumonia Vaccine  Completed  ? HPV Vaccine  Aged Out  ? ? ?

## 2022-01-01 ENCOUNTER — Encounter: Payer: Self-pay | Admitting: Adult Health

## 2022-01-01 ENCOUNTER — Non-Acute Institutional Stay (SKILLED_NURSING_FACILITY): Payer: Medicare Other | Admitting: Adult Health

## 2022-01-01 DIAGNOSIS — G301 Alzheimer's disease with late onset: Secondary | ICD-10-CM | POA: Diagnosis not present

## 2022-01-01 DIAGNOSIS — I10 Essential (primary) hypertension: Secondary | ICD-10-CM | POA: Diagnosis not present

## 2022-01-01 DIAGNOSIS — I700 Atherosclerosis of aorta: Secondary | ICD-10-CM | POA: Diagnosis not present

## 2022-01-01 DIAGNOSIS — F02C0 Dementia in other diseases classified elsewhere, severe, without behavioral disturbance, psychotic disturbance, mood disturbance, and anxiety: Secondary | ICD-10-CM

## 2022-01-01 NOTE — Progress Notes (Signed)
?Location:  [facility] Room Number: 149 D ?Place of Service:  SNF (31) ?Provider: Ok Edwards, NP ? ? ?CODE STATUS: DNR ? ?No Known Allergies ? ?Chief Complaint  ?Patient presents with  ? Medical Management of Chronic Issues  ?                         Severe late onset alzheimer's disease without behavioral disturbance; psychotic disturbance; mood disturbance or anxiety:  Essential hypertension: Aortic atherosclerosis  ? ? ?HPI: ? ?She is a 88 year old long term resident of this facility being seen for the management of her chronic illnesses:  Severe late onset alzheimer's disease without behavioral disturbance; psychotic disturbance; mood disturbance or anxiety:  Essential hypertension: Aortic atherosclerosis. There are no reports of uncontrolled pain; no changes in appetite; weight is stable.  ? ?Past Medical History:  ?Diagnosis Date  ? Allergic rhinitis 11/25/2016  ? Arthritis   ? mild, no known falls uses cane  ? Dementia ([place])   ? Hypertension   ? Neurofibroma 05/29/2017  ? Right breast skin lesion- pathology neurofibroma   ? ? ?Past Surgical History:  ?Procedure Laterality Date  ? MASS EXCISION Right 06/07/2017  ? Procedure: EXCISION SKIN LESION OF RIGHT BREAST;  Surgeon: Virl Cagey, MD;  Location: [facility];  Service: General;  Laterality: Right;  ? NO PAST SURGERIES    ? ? ?Social History  ? ?Socioeconomic History  ? Marital status: Widowed  ?  Spouse name: Not on file  ? Number of children: 0  ? Years of education: Not on file  ? Highest education level: Not on file  ?Occupational History  ? Not on file  ?Tobacco Use  ? Smoking status: Never  ? Smokeless tobacco: Never  ?Vaping Use  ? Vaping Use: Never used  ?Substance and Sexual Activity  ? Alcohol use: No  ? Drug use: No  ? Sexual activity: Not Currently  ?Other Topics Concern  ? Not on file  ?Social History Narrative  ? Not on file  ? ?Social Determinants of Health  ? ?Financial Resource Strain: Not on file  ?Food  Insecurity: Not on file  ?Transportation Needs: Not on file  ?Physical Activity: Not on file  ?Stress: Not on file  ?Social Connections: Not on file  ?Intimate Partner Violence: Not on file  ? ?Family History  ?Problem Relation Age of Onset  ? Diabetes Sister   ? Hypertension Sister   ? ? ? ? ?VITAL SIGNS ?BP 118/72   Pulse (!) 58   Temp (!) 97.3 ?F (36.3 ?C)   Resp 20   Ht '5\' 2"'$  (1.575 m)   Wt 144 lb (65.3 kg)   BMI 26.34 kg/m?  ? ?Outpatient Encounter Medications as of 01/01/2022  ?Medication Sig  ? acetaminophen (TYLENOL) 325 MG tablet Take 650 mg by mouth every 6 (six) hours as needed.  ? albuterol (PROVENTIL) (2.5 MG/3ML) 0.083% nebulizer solution Take 2.5 mg by nebulization every 4 (four) hours as needed for wheezing or shortness of breath.  ? amLODipine (NORVASC) 2.5 MG tablet Take 1 tablet (2.5 mg total) by mouth daily.  ? aspirin EC 81 MG tablet Take 1 tablet (81 mg total) by mouth daily.  ? Calcium Carb-Cholecalciferol 600-10 MG-MCG TABS Take 400 Units by mouth daily.  ? NON FORMULARY Diet: Dysphagia 1 (puree) with thin liquids  ? polyethylene glycol (MIRALAX / GLYCOLAX) 17 g packet Take 17 g by mouth daily.  ?  polyvinyl alcohol (LIQUIFILM TEARS) 1.4 % ophthalmic solution Place 1 drop into both eyes in the morning and at bedtime. Wait at least 5 minutes between multiple drops in same eye.  ? ?No facility-administered encounter medications on file as of 01/01/2022.  ? ? ? ?SIGNIFICANT DIAGNOSTIC EXAMS ? ? ?LABS REVIEWED:  ? ?02-15-21: wbc 8.4; hgb 11.4; hct 36.1; mcv 98,1 plt 143 ?12-08-21: glucose 79; bun 12; creat 0.79; k+ 3.6; na++ 141; ca 8.7; GFR>60; protein 5.7 albumin 2.9  ? ?NO NEW LABS.  ? ?Review of Systems  ?Unable to perform ROS: Dementia  ? ?Physical Exam ?Constitutional:   ?   General: She is not in acute distress. ?   Appearance: She is well-developed. She is not diaphoretic.  ?Neck:  ?   Thyroid: No thyromegaly.  ?Cardiovascular:  ?   Rate and Rhythm: Normal rate and regular rhythm.  ?    Pulses: Normal pulses.  ?   Heart sounds: Normal heart sounds.  ?Pulmonary:  ?   Effort: Pulmonary effort is normal. No respiratory distress.  ?   Breath sounds: Normal breath sounds.  ?Abdominal:  ?   General: Bowel sounds are normal. There is no distension.  ?   Palpations: Abdomen is soft.  ?   Tenderness: There is no abdominal tenderness.  ?Musculoskeletal:     ?   General: Normal range of motion.  ?   Cervical back: Neck supple.  ?   Right lower leg: No edema.  ?   Left lower leg: No edema.  ?Lymphadenopathy:  ?   Cervical: No cervical adenopathy.  ?Skin: ?   General: Skin is warm and dry.  ?Neurological:  ?   Mental Status: She is alert. Mental status is at baseline.  ?Psychiatric:     ?   Mood and Affect: Mood normal.  ? ? ? ? ?ASSESSMENT/ PLAN: ? ?TODAY ? ?Severe late onset alzheimer's disease without behavioral disturbance; psychotic disturbance; mood disturbance or anxiety: her weight is 144 pounds; she is off medications will monitor  ? ?2. Essential hypertension: b/p 118/72 will continue norvasc 2.5 mg daily; is stable will monitor  ? ?3. Aortic atherosclerosis (ct 11-16-16) asa 81 mg daily  ? ?PREVIOUS  ? ?4. Chronic constipation: will continue miralax daily  ? ?5. Hyperlipidemia LDL goal <130: due to her advanced age; will stop lipitor  ? ?6. Chronic respiratory failure with hypoxia: will continue albuterol neb every 6 hours as needed ? ? ? ? ? ? ? ?Ok Edwards NP ?[facility]  ?call [phone]  ? ?

## 2022-01-02 ENCOUNTER — Encounter: Payer: Self-pay | Admitting: Adult Health

## 2022-01-03 DIAGNOSIS — M6281 Muscle weakness (generalized): Secondary | ICD-10-CM | POA: Diagnosis not present

## 2022-01-03 DIAGNOSIS — G311 Senile degeneration of brain, not elsewhere classified: Secondary | ICD-10-CM | POA: Diagnosis not present

## 2022-01-03 DIAGNOSIS — I10 Essential (primary) hypertension: Secondary | ICD-10-CM | POA: Diagnosis not present

## 2022-01-03 DIAGNOSIS — E785 Hyperlipidemia, unspecified: Secondary | ICD-10-CM | POA: Diagnosis not present

## 2022-01-03 DIAGNOSIS — Z8616 Personal history of COVID-19: Secondary | ICD-10-CM | POA: Diagnosis not present

## 2022-01-03 DIAGNOSIS — F03C0 Unspecified dementia, severe, without behavioral disturbance, psychotic disturbance, mood disturbance, and anxiety: Secondary | ICD-10-CM | POA: Diagnosis not present

## 2022-01-09 DIAGNOSIS — Z23 Encounter for immunization: Secondary | ICD-10-CM | POA: Diagnosis not present

## 2022-01-10 DIAGNOSIS — I10 Essential (primary) hypertension: Secondary | ICD-10-CM | POA: Diagnosis not present

## 2022-01-10 DIAGNOSIS — F03C0 Unspecified dementia, severe, without behavioral disturbance, psychotic disturbance, mood disturbance, and anxiety: Secondary | ICD-10-CM | POA: Diagnosis not present

## 2022-01-10 DIAGNOSIS — G311 Senile degeneration of brain, not elsewhere classified: Secondary | ICD-10-CM | POA: Diagnosis not present

## 2022-01-10 DIAGNOSIS — Z8616 Personal history of COVID-19: Secondary | ICD-10-CM | POA: Diagnosis not present

## 2022-01-10 DIAGNOSIS — E785 Hyperlipidemia, unspecified: Secondary | ICD-10-CM | POA: Diagnosis not present

## 2022-01-10 DIAGNOSIS — M6281 Muscle weakness (generalized): Secondary | ICD-10-CM | POA: Diagnosis not present

## 2022-01-17 DIAGNOSIS — Z8616 Personal history of COVID-19: Secondary | ICD-10-CM | POA: Diagnosis not present

## 2022-01-17 DIAGNOSIS — G311 Senile degeneration of brain, not elsewhere classified: Secondary | ICD-10-CM | POA: Diagnosis not present

## 2022-01-17 DIAGNOSIS — I10 Essential (primary) hypertension: Secondary | ICD-10-CM | POA: Diagnosis not present

## 2022-01-17 DIAGNOSIS — E785 Hyperlipidemia, unspecified: Secondary | ICD-10-CM | POA: Diagnosis not present

## 2022-01-17 DIAGNOSIS — F03C0 Unspecified dementia, severe, without behavioral disturbance, psychotic disturbance, mood disturbance, and anxiety: Secondary | ICD-10-CM | POA: Diagnosis not present

## 2022-01-17 DIAGNOSIS — M6281 Muscle weakness (generalized): Secondary | ICD-10-CM | POA: Diagnosis not present

## 2022-01-18 DIAGNOSIS — R0602 Shortness of breath: Secondary | ICD-10-CM | POA: Diagnosis not present

## 2022-01-18 DIAGNOSIS — R52 Pain, unspecified: Secondary | ICD-10-CM | POA: Diagnosis not present

## 2022-01-18 DIAGNOSIS — E785 Hyperlipidemia, unspecified: Secondary | ICD-10-CM | POA: Diagnosis not present

## 2022-01-18 DIAGNOSIS — J9621 Acute and chronic respiratory failure with hypoxia: Secondary | ICD-10-CM | POA: Diagnosis not present

## 2022-01-18 DIAGNOSIS — F03C0 Unspecified dementia, severe, without behavioral disturbance, psychotic disturbance, mood disturbance, and anxiety: Secondary | ICD-10-CM | POA: Diagnosis not present

## 2022-01-18 DIAGNOSIS — R2689 Other abnormalities of gait and mobility: Secondary | ICD-10-CM | POA: Diagnosis not present

## 2022-01-18 DIAGNOSIS — G311 Senile degeneration of brain, not elsewhere classified: Secondary | ICD-10-CM | POA: Diagnosis not present

## 2022-01-18 DIAGNOSIS — H04123 Dry eye syndrome of bilateral lacrimal glands: Secondary | ICD-10-CM | POA: Diagnosis not present

## 2022-01-18 DIAGNOSIS — Z8616 Personal history of COVID-19: Secondary | ICD-10-CM | POA: Diagnosis not present

## 2022-01-18 DIAGNOSIS — R278 Other lack of coordination: Secondary | ICD-10-CM | POA: Diagnosis not present

## 2022-01-18 DIAGNOSIS — I10 Essential (primary) hypertension: Secondary | ICD-10-CM | POA: Diagnosis not present

## 2022-01-18 DIAGNOSIS — M6281 Muscle weakness (generalized): Secondary | ICD-10-CM | POA: Diagnosis not present

## 2022-01-18 DIAGNOSIS — R4702 Dysphasia: Secondary | ICD-10-CM | POA: Diagnosis not present

## 2022-01-23 DIAGNOSIS — E785 Hyperlipidemia, unspecified: Secondary | ICD-10-CM | POA: Diagnosis not present

## 2022-01-23 DIAGNOSIS — Z8616 Personal history of COVID-19: Secondary | ICD-10-CM | POA: Diagnosis not present

## 2022-01-23 DIAGNOSIS — F03C0 Unspecified dementia, severe, without behavioral disturbance, psychotic disturbance, mood disturbance, and anxiety: Secondary | ICD-10-CM | POA: Diagnosis not present

## 2022-01-23 DIAGNOSIS — G311 Senile degeneration of brain, not elsewhere classified: Secondary | ICD-10-CM | POA: Diagnosis not present

## 2022-01-23 DIAGNOSIS — I10 Essential (primary) hypertension: Secondary | ICD-10-CM | POA: Diagnosis not present

## 2022-01-23 DIAGNOSIS — M6281 Muscle weakness (generalized): Secondary | ICD-10-CM | POA: Diagnosis not present

## 2022-01-24 DIAGNOSIS — E785 Hyperlipidemia, unspecified: Secondary | ICD-10-CM | POA: Diagnosis not present

## 2022-01-24 DIAGNOSIS — Z961 Presence of intraocular lens: Secondary | ICD-10-CM | POA: Diagnosis not present

## 2022-01-24 DIAGNOSIS — F03C0 Unspecified dementia, severe, without behavioral disturbance, psychotic disturbance, mood disturbance, and anxiety: Secondary | ICD-10-CM | POA: Diagnosis not present

## 2022-01-24 DIAGNOSIS — G311 Senile degeneration of brain, not elsewhere classified: Secondary | ICD-10-CM | POA: Diagnosis not present

## 2022-01-24 DIAGNOSIS — H524 Presbyopia: Secondary | ICD-10-CM | POA: Diagnosis not present

## 2022-01-24 DIAGNOSIS — Z8616 Personal history of COVID-19: Secondary | ICD-10-CM | POA: Diagnosis not present

## 2022-01-24 DIAGNOSIS — M6281 Muscle weakness (generalized): Secondary | ICD-10-CM | POA: Diagnosis not present

## 2022-01-24 DIAGNOSIS — I10 Essential (primary) hypertension: Secondary | ICD-10-CM | POA: Diagnosis not present

## 2022-01-26 DIAGNOSIS — E785 Hyperlipidemia, unspecified: Secondary | ICD-10-CM | POA: Diagnosis not present

## 2022-01-26 DIAGNOSIS — G311 Senile degeneration of brain, not elsewhere classified: Secondary | ICD-10-CM | POA: Diagnosis not present

## 2022-01-26 DIAGNOSIS — M6281 Muscle weakness (generalized): Secondary | ICD-10-CM | POA: Diagnosis not present

## 2022-01-26 DIAGNOSIS — F03C0 Unspecified dementia, severe, without behavioral disturbance, psychotic disturbance, mood disturbance, and anxiety: Secondary | ICD-10-CM | POA: Diagnosis not present

## 2022-01-26 DIAGNOSIS — I10 Essential (primary) hypertension: Secondary | ICD-10-CM | POA: Diagnosis not present

## 2022-01-26 DIAGNOSIS — Z8616 Personal history of COVID-19: Secondary | ICD-10-CM | POA: Diagnosis not present

## 2022-01-29 DIAGNOSIS — M6281 Muscle weakness (generalized): Secondary | ICD-10-CM | POA: Diagnosis not present

## 2022-01-29 DIAGNOSIS — G311 Senile degeneration of brain, not elsewhere classified: Secondary | ICD-10-CM | POA: Diagnosis not present

## 2022-01-29 DIAGNOSIS — F03C0 Unspecified dementia, severe, without behavioral disturbance, psychotic disturbance, mood disturbance, and anxiety: Secondary | ICD-10-CM | POA: Diagnosis not present

## 2022-01-29 DIAGNOSIS — E785 Hyperlipidemia, unspecified: Secondary | ICD-10-CM | POA: Diagnosis not present

## 2022-01-29 DIAGNOSIS — Z8616 Personal history of COVID-19: Secondary | ICD-10-CM | POA: Diagnosis not present

## 2022-01-29 DIAGNOSIS — I10 Essential (primary) hypertension: Secondary | ICD-10-CM | POA: Diagnosis not present

## 2022-01-30 ENCOUNTER — Encounter: Payer: Self-pay | Admitting: Adult Health

## 2022-01-30 ENCOUNTER — Non-Acute Institutional Stay (SKILLED_NURSING_FACILITY): Payer: Medicare Other | Admitting: Adult Health

## 2022-01-30 DIAGNOSIS — I700 Atherosclerosis of aorta: Secondary | ICD-10-CM | POA: Diagnosis not present

## 2022-01-30 DIAGNOSIS — I10 Essential (primary) hypertension: Secondary | ICD-10-CM

## 2022-01-30 DIAGNOSIS — G301 Alzheimer's disease with late onset: Secondary | ICD-10-CM | POA: Diagnosis not present

## 2022-01-30 DIAGNOSIS — F02C0 Dementia in other diseases classified elsewhere, severe, without behavioral disturbance, psychotic disturbance, mood disturbance, and anxiety: Secondary | ICD-10-CM | POA: Diagnosis not present

## 2022-01-30 NOTE — Progress Notes (Signed)
Location:  [place] Room Number: 149 Place of Service:  SNF (31)   CODE STATUS: dnr   No Known Allergies  Chief Complaint  Patient presents with   Medical Management of Chronic Issues                    Severe late onset alzheimer's disease without behavioral disturbance; psychotic disturbance; mood disturbance or anxiety:  Essential hypertension:  Aortic atherosclerosis    HPI:  She is a 88 year old long term resident of this facility being seen for the management of her chronic illnesses: Severe late onset alzheimer's disease without behavioral disturbance; psychotic disturbance; mood disturbance or anxiety:  Essential hypertension:  Aortic atherosclerosis. She is followed by hospice care. There are no reports of anxiety; distress or pain.   Past Medical History:  Diagnosis Date   Allergic rhinitis 11/25/2016   Arthritis    mild, no known falls uses cane   Dementia ([place])    Hypertension    Neurofibroma 05/29/2017   Right breast skin lesion- pathology neurofibroma     Past Surgical History:  Procedure Laterality Date   MASS EXCISION Right 06/07/2017   Procedure: EXCISION SKIN LESION OF RIGHT BREAST;  Surgeon: Virl Cagey, MD;  Location: [facility];  Service: General;  Laterality: Right;   NO PAST SURGERIES      Social History   Socioeconomic History   Marital status: Widowed    Spouse name: Not on file   Number of children: 0   Years of education: Not on file   Highest education level: Not on file  Occupational History   Not on file  Tobacco Use   Smoking status: Never   Smokeless tobacco: Never  Vaping Use   Vaping Use: Never used  Substance and Sexual Activity   Alcohol use: No   Drug use: No   Sexual activity: Not Currently  Other Topics Concern   Not on file  Social History Narrative   Not on file   Social Determinants of Health   Financial Resource Strain: Not on file  Food Insecurity: Not on file  Transportation  Needs: Not on file  Physical Activity: Inactive (03/31/2018)   Exercise Vital Sign    Days of Exercise per Week: 0 days    Minutes of Exercise per Session: 0 min  Stress: Not on file  Social Connections: Somewhat Isolated (03/31/2018)   Social Connection and Isolation Panel [NHANES]    Frequency of Communication with Friends and Family: More than three times a week    Frequency of Social Gatherings with Friends and Family: Once a week    Attends Religious Services: 1 to 4 times per year    Active Member of [employer] or Organizations: No    Attends Archivist Meetings: Never    Marital Status: Widowed  Human resources officer Violence: Not on file   Family History  Problem Relation Age of Onset   Diabetes Sister    Hypertension Sister       VITAL SIGNS BP (!) 117/58   Pulse (!) 54   Temp 97.9 F (36.6 C)   Ht '5\' 2"'$  (1.575 m)   Wt 149 lb 3.2 oz (67.7 kg)   BMI 27.29 kg/m   Outpatient Encounter Medications as of 01/30/2022  Medication Sig   acetaminophen (TYLENOL) 325 MG tablet Take 650 mg by mouth every 6 (six) hours as needed.   albuterol (PROVENTIL) (2.5 MG/3ML) 0.083% nebulizer solution  Take 2.5 mg by nebulization every 4 (four) hours as needed for wheezing or shortness of breath.   amLODipine (NORVASC) 2.5 MG tablet Take 1 tablet (2.5 mg total) by mouth daily.   aspirin EC 81 MG tablet Take 1 tablet (81 mg total) by mouth daily.   Calcium Carb-Cholecalciferol 600-10 MG-MCG TABS Take 400 Units by mouth daily.   NON FORMULARY Diet: Dysphagia 1 (puree) with thin liquids   polyethylene glycol (MIRALAX / GLYCOLAX) 17 g packet Take 17 g by mouth daily.   polyvinyl alcohol (LIQUIFILM TEARS) 1.4 % ophthalmic solution Place 1 drop into both eyes in the morning and at bedtime. Wait at least 5 minutes between multiple drops in same eye.   No facility-administered encounter medications on file as of 01/30/2022.     SIGNIFICANT DIAGNOSTIC EXAMS  LABS REVIEWED:   02-15-21: wbc 8.4;  hgb 11.4; hct 36.1; mcv 98,1 plt 143 12-08-21: glucose 79; bun 12; creat 0.79; k+ 3.6; na++ 141; ca 8.7; GFR>60; protein 5.7 albumin 2.9   NO NEW LABS.   Review of Systems  Unable to perform ROS: Dementia   Physical Exam Constitutional:      General: She is not in acute distress.    Appearance: She is well-developed. She is not diaphoretic.  Neck:     Thyroid: No thyromegaly.  Cardiovascular:     Rate and Rhythm: Normal rate and regular rhythm.     Pulses: Normal pulses.     Heart sounds: Normal heart sounds.  Pulmonary:     Effort: Pulmonary effort is normal. No respiratory distress.     Breath sounds: Normal breath sounds.  Abdominal:     General: Bowel sounds are normal. There is no distension.     Palpations: Abdomen is soft.     Tenderness: There is no abdominal tenderness.  Musculoskeletal:        General: Normal range of motion.     Cervical back: Neck supple.     Right lower leg: No edema.     Left lower leg: No edema.  Lymphadenopathy:     Cervical: No cervical adenopathy.  Skin:    General: Skin is warm and dry.  Neurological:     Mental Status: She is alert. Mental status is at baseline.  Psychiatric:        Mood and Affect: Mood normal.      ASSESSMENT/ PLAN:  TODAY  Severe late onset alzheimer's disease without behavioral disturbance; psychotic disturbance; mood disturbance or anxiety: her weight is 149 pounds; she is off medications will monitor   2. Essential hypertension: b/p 117/58 will stop norvasc   3. Aortic atherosclerosis (ct 11-16-16) asa 81 mg daily   PREVIOUS   4. Chronic constipation: will continue miralax daily   5. Hyperlipidemia LDL goal <130: due to her advanced age; off lipitor    6. Chronic respiratory failure with hypoxia: will continue albuterol neb every 6 hours as needed    Ok Edwards NP [facility]   call [phone]

## 2022-02-02 DIAGNOSIS — Z8616 Personal history of COVID-19: Secondary | ICD-10-CM | POA: Diagnosis not present

## 2022-02-02 DIAGNOSIS — I10 Essential (primary) hypertension: Secondary | ICD-10-CM | POA: Diagnosis not present

## 2022-02-02 DIAGNOSIS — F03C0 Unspecified dementia, severe, without behavioral disturbance, psychotic disturbance, mood disturbance, and anxiety: Secondary | ICD-10-CM | POA: Diagnosis not present

## 2022-02-02 DIAGNOSIS — M6281 Muscle weakness (generalized): Secondary | ICD-10-CM | POA: Diagnosis not present

## 2022-02-02 DIAGNOSIS — G311 Senile degeneration of brain, not elsewhere classified: Secondary | ICD-10-CM | POA: Diagnosis not present

## 2022-02-02 DIAGNOSIS — E785 Hyperlipidemia, unspecified: Secondary | ICD-10-CM | POA: Diagnosis not present

## 2022-02-07 DIAGNOSIS — G311 Senile degeneration of brain, not elsewhere classified: Secondary | ICD-10-CM | POA: Diagnosis not present

## 2022-02-07 DIAGNOSIS — Z8616 Personal history of COVID-19: Secondary | ICD-10-CM | POA: Diagnosis not present

## 2022-02-07 DIAGNOSIS — I10 Essential (primary) hypertension: Secondary | ICD-10-CM | POA: Diagnosis not present

## 2022-02-07 DIAGNOSIS — E785 Hyperlipidemia, unspecified: Secondary | ICD-10-CM | POA: Diagnosis not present

## 2022-02-07 DIAGNOSIS — M6281 Muscle weakness (generalized): Secondary | ICD-10-CM | POA: Diagnosis not present

## 2022-02-07 DIAGNOSIS — F03C0 Unspecified dementia, severe, without behavioral disturbance, psychotic disturbance, mood disturbance, and anxiety: Secondary | ICD-10-CM | POA: Diagnosis not present

## 2022-02-09 DIAGNOSIS — M6281 Muscle weakness (generalized): Secondary | ICD-10-CM | POA: Diagnosis not present

## 2022-02-09 DIAGNOSIS — Z8616 Personal history of COVID-19: Secondary | ICD-10-CM | POA: Diagnosis not present

## 2022-02-09 DIAGNOSIS — I10 Essential (primary) hypertension: Secondary | ICD-10-CM | POA: Diagnosis not present

## 2022-02-09 DIAGNOSIS — F03C0 Unspecified dementia, severe, without behavioral disturbance, psychotic disturbance, mood disturbance, and anxiety: Secondary | ICD-10-CM | POA: Diagnosis not present

## 2022-02-09 DIAGNOSIS — G311 Senile degeneration of brain, not elsewhere classified: Secondary | ICD-10-CM | POA: Diagnosis not present

## 2022-02-09 DIAGNOSIS — E785 Hyperlipidemia, unspecified: Secondary | ICD-10-CM | POA: Diagnosis not present

## 2022-02-12 DIAGNOSIS — M79675 Pain in left toe(s): Secondary | ICD-10-CM | POA: Diagnosis not present

## 2022-02-12 DIAGNOSIS — I739 Peripheral vascular disease, unspecified: Secondary | ICD-10-CM | POA: Diagnosis not present

## 2022-02-12 DIAGNOSIS — M79674 Pain in right toe(s): Secondary | ICD-10-CM | POA: Diagnosis not present

## 2022-02-12 DIAGNOSIS — B351 Tinea unguium: Secondary | ICD-10-CM | POA: Diagnosis not present

## 2022-02-12 DIAGNOSIS — M2041 Other hammer toe(s) (acquired), right foot: Secondary | ICD-10-CM | POA: Diagnosis not present

## 2022-02-12 DIAGNOSIS — R262 Difficulty in walking, not elsewhere classified: Secondary | ICD-10-CM | POA: Diagnosis not present

## 2022-02-12 DIAGNOSIS — M2042 Other hammer toe(s) (acquired), left foot: Secondary | ICD-10-CM | POA: Diagnosis not present

## 2022-02-14 DIAGNOSIS — M6281 Muscle weakness (generalized): Secondary | ICD-10-CM | POA: Diagnosis not present

## 2022-02-14 DIAGNOSIS — Z8616 Personal history of COVID-19: Secondary | ICD-10-CM | POA: Diagnosis not present

## 2022-02-14 DIAGNOSIS — G311 Senile degeneration of brain, not elsewhere classified: Secondary | ICD-10-CM | POA: Diagnosis not present

## 2022-02-14 DIAGNOSIS — F03C0 Unspecified dementia, severe, without behavioral disturbance, psychotic disturbance, mood disturbance, and anxiety: Secondary | ICD-10-CM | POA: Diagnosis not present

## 2022-02-14 DIAGNOSIS — E785 Hyperlipidemia, unspecified: Secondary | ICD-10-CM | POA: Diagnosis not present

## 2022-02-14 DIAGNOSIS — I10 Essential (primary) hypertension: Secondary | ICD-10-CM | POA: Diagnosis not present

## 2022-02-15 DIAGNOSIS — E785 Hyperlipidemia, unspecified: Secondary | ICD-10-CM | POA: Diagnosis not present

## 2022-02-15 DIAGNOSIS — Z8616 Personal history of COVID-19: Secondary | ICD-10-CM | POA: Diagnosis not present

## 2022-02-15 DIAGNOSIS — I10 Essential (primary) hypertension: Secondary | ICD-10-CM | POA: Diagnosis not present

## 2022-02-15 DIAGNOSIS — G311 Senile degeneration of brain, not elsewhere classified: Secondary | ICD-10-CM | POA: Diagnosis not present

## 2022-02-15 DIAGNOSIS — M6281 Muscle weakness (generalized): Secondary | ICD-10-CM | POA: Diagnosis not present

## 2022-02-15 DIAGNOSIS — F03C0 Unspecified dementia, severe, without behavioral disturbance, psychotic disturbance, mood disturbance, and anxiety: Secondary | ICD-10-CM | POA: Diagnosis not present

## 2022-02-16 DIAGNOSIS — G311 Senile degeneration of brain, not elsewhere classified: Secondary | ICD-10-CM | POA: Diagnosis not present

## 2022-02-16 DIAGNOSIS — M6281 Muscle weakness (generalized): Secondary | ICD-10-CM | POA: Diagnosis not present

## 2022-02-16 DIAGNOSIS — I10 Essential (primary) hypertension: Secondary | ICD-10-CM | POA: Diagnosis not present

## 2022-02-16 DIAGNOSIS — Z8616 Personal history of COVID-19: Secondary | ICD-10-CM | POA: Diagnosis not present

## 2022-02-16 DIAGNOSIS — F03C0 Unspecified dementia, severe, without behavioral disturbance, psychotic disturbance, mood disturbance, and anxiety: Secondary | ICD-10-CM | POA: Diagnosis not present

## 2022-02-16 DIAGNOSIS — E785 Hyperlipidemia, unspecified: Secondary | ICD-10-CM | POA: Diagnosis not present

## 2022-02-17 DIAGNOSIS — J9621 Acute and chronic respiratory failure with hypoxia: Secondary | ICD-10-CM | POA: Diagnosis not present

## 2022-02-17 DIAGNOSIS — R2689 Other abnormalities of gait and mobility: Secondary | ICD-10-CM | POA: Diagnosis not present

## 2022-02-17 DIAGNOSIS — R0602 Shortness of breath: Secondary | ICD-10-CM | POA: Diagnosis not present

## 2022-02-17 DIAGNOSIS — R52 Pain, unspecified: Secondary | ICD-10-CM | POA: Diagnosis not present

## 2022-02-17 DIAGNOSIS — H04123 Dry eye syndrome of bilateral lacrimal glands: Secondary | ICD-10-CM | POA: Diagnosis not present

## 2022-02-17 DIAGNOSIS — Z8616 Personal history of COVID-19: Secondary | ICD-10-CM | POA: Diagnosis not present

## 2022-02-17 DIAGNOSIS — R4702 Dysphasia: Secondary | ICD-10-CM | POA: Diagnosis not present

## 2022-02-17 DIAGNOSIS — E785 Hyperlipidemia, unspecified: Secondary | ICD-10-CM | POA: Diagnosis not present

## 2022-02-17 DIAGNOSIS — M6281 Muscle weakness (generalized): Secondary | ICD-10-CM | POA: Diagnosis not present

## 2022-02-17 DIAGNOSIS — F03C0 Unspecified dementia, severe, without behavioral disturbance, psychotic disturbance, mood disturbance, and anxiety: Secondary | ICD-10-CM | POA: Diagnosis not present

## 2022-02-17 DIAGNOSIS — I10 Essential (primary) hypertension: Secondary | ICD-10-CM | POA: Diagnosis not present

## 2022-02-17 DIAGNOSIS — G311 Senile degeneration of brain, not elsewhere classified: Secondary | ICD-10-CM | POA: Diagnosis not present

## 2022-02-17 DIAGNOSIS — R278 Other lack of coordination: Secondary | ICD-10-CM | POA: Diagnosis not present

## 2022-02-21 DIAGNOSIS — M6281 Muscle weakness (generalized): Secondary | ICD-10-CM | POA: Diagnosis not present

## 2022-02-21 DIAGNOSIS — G311 Senile degeneration of brain, not elsewhere classified: Secondary | ICD-10-CM | POA: Diagnosis not present

## 2022-02-21 DIAGNOSIS — Z8616 Personal history of COVID-19: Secondary | ICD-10-CM | POA: Diagnosis not present

## 2022-02-21 DIAGNOSIS — F03C0 Unspecified dementia, severe, without behavioral disturbance, psychotic disturbance, mood disturbance, and anxiety: Secondary | ICD-10-CM | POA: Diagnosis not present

## 2022-02-21 DIAGNOSIS — E785 Hyperlipidemia, unspecified: Secondary | ICD-10-CM | POA: Diagnosis not present

## 2022-02-21 DIAGNOSIS — I10 Essential (primary) hypertension: Secondary | ICD-10-CM | POA: Diagnosis not present

## 2022-02-22 ENCOUNTER — Encounter: Payer: Self-pay | Admitting: Internal Medicine

## 2022-02-22 ENCOUNTER — Non-Acute Institutional Stay (SKILLED_NURSING_FACILITY): Payer: Medicare Other | Admitting: Internal Medicine

## 2022-02-22 DIAGNOSIS — J9611 Chronic respiratory failure with hypoxia: Secondary | ICD-10-CM

## 2022-02-22 DIAGNOSIS — G301 Alzheimer's disease with late onset: Secondary | ICD-10-CM | POA: Diagnosis not present

## 2022-02-22 DIAGNOSIS — I10 Essential (primary) hypertension: Secondary | ICD-10-CM | POA: Diagnosis not present

## 2022-02-22 DIAGNOSIS — F02C3 Dementia in other diseases classified elsewhere, severe, with mood disturbance: Secondary | ICD-10-CM | POA: Diagnosis not present

## 2022-02-22 NOTE — Progress Notes (Unsigned)
NURSING HOME LOCATION:  [facility] ROOM NUMBER:  149 D  CODE STATUS:  Full Code  PCP:  Ok Edwards NP  This is a nursing facility follow up visit of chronic medical diagnoses & to document compliance with Regulation 483.30 (c) in [employer] [place] Manual Phase 2 which mandates caregiver visit ( visits can alternate among physician, PA or NP as per statutes) within 10 days of 30 days / 60 days/ 90 days post admission to SNF date    Interim medical record and care since last SNF visit was updated with review of diagnostic studies and change in clinical status since last visit were documented.  HPI: She is a permanent resident of this facility with diagnoses of aortic atherosclerosis, essential hypertension, late onset Alzheimer's dementia, dyslipidemia.  Most recent labs on record before/21/23 with albumin of 2.9 and total protein of 5.7.  CKD stage II was present with a creatinine of 0.79 and GFR greater than 60.  TSH has not been checked since 01/18/2021 when it was therapeutic at 2.17.  Review of systems: Dementia invalidated responses.  She introduced herself as Katrina Berry, not Yolanda Bonine. she would not answer questions stating "do not you be checking me."  She maintained that "I really believe appear."  She went on to state that she was leaving because "my sister is not home."  Even after introduced myself and explained the reason for the medical visit; she remained suspicious and somewhat combative.  Constitutional: No fever, significant weight change, fatigue  Eyes: No redness, discharge, pain, vision change ENT/mouth: No nasal congestion,  purulent discharge, earache, change in hearing, sore throat  Cardiovascular: No chest pain, palpitations, paroxysmal nocturnal dyspnea, claudication, edema  Respiratory: No cough, sputum production, hemoptysis, DOE, significant snoring, apnea   Gastrointestinal: No heartburn, dysphagia, abdominal pain, nausea /vomiting,  rectal bleeding, melena, change in bowels Genitourinary: No dysuria, hematuria, pyuria, incontinence, nocturia Musculoskeletal: No joint stiffness, joint swelling, weakness, pain Dermatologic: No rash, pruritus, change in appearance of skin Neurologic: No dizziness, headache, syncope, seizures, numbness, tingling Psychiatric: No significant anxiety, depression, insomnia, anorexia Endocrine: No change in hair/skin/nails, excessive thirst, excessive hunger, excessive urination  Hematologic/lymphatic: No significant bruising, lymphadenopathy, abnormal bleeding Allergy/immunology: No itchy/watery eyes, significant sneezing, urticaria, angioedema  Physical exam:  Pertinent or positive findings: She was in a wheelchair outside her room .she appears much younger than her stated age.  Behavior is noted above.  She has a small papule in the right eyebrow.  The lacrimal glands are prominent.  Arcus senilis is noted.  She exhibits expiratory rhonchi of the low-grade nature bilaterally.  She has 1/2+ edema at the sock line.  Pedal pulses are decreased. General appearance: Adequately nourished; no acute distress, increased work of breathing is present.   Lymphatic: No lymphadenopathy about the head, neck, axilla. Eyes: No conjunctival inflammation or lid edema is present. There is no scleral icterus. Ears:  External ear exam shows no significant lesions or deformities.   Nose:  External nasal examination shows no deformity or inflammation. Nasal mucosa are pink and moist without lesions, exudates Oral exam:  Lips and gums are healthy appearing. There is no oropharyngeal erythema or exudate. Neck:  No thyromegaly, masses, tenderness noted.    Heart:  Normal rate and regular rhythm. S1 and S2 normal without gallop, murmur, click, rub .  Lungs: Chest clear to auscultation without wheezes, rhonchi, rales, rubs. Abdomen: Bowel sounds are normal. Abdomen is soft and nontender with no organomegaly,  hernias,  masses. GU: Deferred  Extremities:  No cyanosis, clubbing, edema  Neurologic exam : Cn 2-7 intact Strength equal  in upper & lower extremities Balance, Rhomberg, finger to nose testing could not be completed due to clinical state Deep tendon reflexes are equal Skin: Warm & dry w/o tenting. No significant lesions or rash.  See summary under each active problem in the Problem List with associated updated therapeutic plan

## 2022-02-23 DIAGNOSIS — M6281 Muscle weakness (generalized): Secondary | ICD-10-CM | POA: Diagnosis not present

## 2022-02-23 DIAGNOSIS — Z8616 Personal history of COVID-19: Secondary | ICD-10-CM | POA: Diagnosis not present

## 2022-02-23 DIAGNOSIS — F03C0 Unspecified dementia, severe, without behavioral disturbance, psychotic disturbance, mood disturbance, and anxiety: Secondary | ICD-10-CM | POA: Diagnosis not present

## 2022-02-23 DIAGNOSIS — I10 Essential (primary) hypertension: Secondary | ICD-10-CM | POA: Diagnosis not present

## 2022-02-23 DIAGNOSIS — E785 Hyperlipidemia, unspecified: Secondary | ICD-10-CM | POA: Diagnosis not present

## 2022-02-23 DIAGNOSIS — G311 Senile degeneration of brain, not elsewhere classified: Secondary | ICD-10-CM | POA: Diagnosis not present

## 2022-02-23 NOTE — Assessment & Plan Note (Addendum)
On exam she does exhibit low-grade expiratory rhonchi without respiratory distress.  O2 sats are excellent on room air.  No change indicated but we will continue to monitor. Most recent portable chest x-ray was performed 02/11/2021 and revealed bibasilar atelectasis versus infiltrate.  Film actually reviewed; it revealed poor inspiration suggesting the etiology of changes is truly atelectasis.  As needed bronchodilators via nebulizer or low-dose steroids will be introduced if there is progression of the bronchospastic findings.

## 2022-02-23 NOTE — Patient Instructions (Signed)
See assessment and plan under each diagnosis in the problem list and acutely for this visit 

## 2022-02-23 NOTE — Assessment & Plan Note (Signed)
Today's blood pressure of 156/67 is an outlier.  Chart was reviewed and the majority of the blood pressures are in the range of 117/58 up to 140/80.  On one occasion it was as high as 162/74.  Unless there is persistent significant systolic hypertension; no change indicated.

## 2022-02-23 NOTE — Assessment & Plan Note (Signed)
Today she exhibited paranoia and became very agitated.  Medication change will be pursued if there is escalation of the mood disorder.

## 2022-02-28 DIAGNOSIS — G311 Senile degeneration of brain, not elsewhere classified: Secondary | ICD-10-CM | POA: Diagnosis not present

## 2022-02-28 DIAGNOSIS — F03C0 Unspecified dementia, severe, without behavioral disturbance, psychotic disturbance, mood disturbance, and anxiety: Secondary | ICD-10-CM | POA: Diagnosis not present

## 2022-02-28 DIAGNOSIS — E785 Hyperlipidemia, unspecified: Secondary | ICD-10-CM | POA: Diagnosis not present

## 2022-02-28 DIAGNOSIS — M6281 Muscle weakness (generalized): Secondary | ICD-10-CM | POA: Diagnosis not present

## 2022-02-28 DIAGNOSIS — I10 Essential (primary) hypertension: Secondary | ICD-10-CM | POA: Diagnosis not present

## 2022-02-28 DIAGNOSIS — Z8616 Personal history of COVID-19: Secondary | ICD-10-CM | POA: Diagnosis not present

## 2022-03-01 DIAGNOSIS — G311 Senile degeneration of brain, not elsewhere classified: Secondary | ICD-10-CM | POA: Diagnosis not present

## 2022-03-01 DIAGNOSIS — I10 Essential (primary) hypertension: Secondary | ICD-10-CM | POA: Diagnosis not present

## 2022-03-01 DIAGNOSIS — M6281 Muscle weakness (generalized): Secondary | ICD-10-CM | POA: Diagnosis not present

## 2022-03-01 DIAGNOSIS — E785 Hyperlipidemia, unspecified: Secondary | ICD-10-CM | POA: Diagnosis not present

## 2022-03-01 DIAGNOSIS — F03C0 Unspecified dementia, severe, without behavioral disturbance, psychotic disturbance, mood disturbance, and anxiety: Secondary | ICD-10-CM | POA: Diagnosis not present

## 2022-03-01 DIAGNOSIS — Z8616 Personal history of COVID-19: Secondary | ICD-10-CM | POA: Diagnosis not present

## 2022-03-07 DIAGNOSIS — M6281 Muscle weakness (generalized): Secondary | ICD-10-CM | POA: Diagnosis not present

## 2022-03-07 DIAGNOSIS — F03C0 Unspecified dementia, severe, without behavioral disturbance, psychotic disturbance, mood disturbance, and anxiety: Secondary | ICD-10-CM | POA: Diagnosis not present

## 2022-03-07 DIAGNOSIS — I10 Essential (primary) hypertension: Secondary | ICD-10-CM | POA: Diagnosis not present

## 2022-03-07 DIAGNOSIS — Z8616 Personal history of COVID-19: Secondary | ICD-10-CM | POA: Diagnosis not present

## 2022-03-07 DIAGNOSIS — E785 Hyperlipidemia, unspecified: Secondary | ICD-10-CM | POA: Diagnosis not present

## 2022-03-07 DIAGNOSIS — G311 Senile degeneration of brain, not elsewhere classified: Secondary | ICD-10-CM | POA: Diagnosis not present

## 2022-03-08 DIAGNOSIS — E785 Hyperlipidemia, unspecified: Secondary | ICD-10-CM | POA: Diagnosis not present

## 2022-03-08 DIAGNOSIS — F03C0 Unspecified dementia, severe, without behavioral disturbance, psychotic disturbance, mood disturbance, and anxiety: Secondary | ICD-10-CM | POA: Diagnosis not present

## 2022-03-08 DIAGNOSIS — M6281 Muscle weakness (generalized): Secondary | ICD-10-CM | POA: Diagnosis not present

## 2022-03-08 DIAGNOSIS — I10 Essential (primary) hypertension: Secondary | ICD-10-CM | POA: Diagnosis not present

## 2022-03-08 DIAGNOSIS — Z8616 Personal history of COVID-19: Secondary | ICD-10-CM | POA: Diagnosis not present

## 2022-03-08 DIAGNOSIS — G311 Senile degeneration of brain, not elsewhere classified: Secondary | ICD-10-CM | POA: Diagnosis not present

## 2022-03-09 DIAGNOSIS — I10 Essential (primary) hypertension: Secondary | ICD-10-CM | POA: Diagnosis not present

## 2022-03-09 DIAGNOSIS — Z8616 Personal history of COVID-19: Secondary | ICD-10-CM | POA: Diagnosis not present

## 2022-03-09 DIAGNOSIS — E785 Hyperlipidemia, unspecified: Secondary | ICD-10-CM | POA: Diagnosis not present

## 2022-03-09 DIAGNOSIS — M6281 Muscle weakness (generalized): Secondary | ICD-10-CM | POA: Diagnosis not present

## 2022-03-09 DIAGNOSIS — G311 Senile degeneration of brain, not elsewhere classified: Secondary | ICD-10-CM | POA: Diagnosis not present

## 2022-03-09 DIAGNOSIS — F03C0 Unspecified dementia, severe, without behavioral disturbance, psychotic disturbance, mood disturbance, and anxiety: Secondary | ICD-10-CM | POA: Diagnosis not present

## 2022-03-14 DIAGNOSIS — G311 Senile degeneration of brain, not elsewhere classified: Secondary | ICD-10-CM | POA: Diagnosis not present

## 2022-03-14 DIAGNOSIS — I10 Essential (primary) hypertension: Secondary | ICD-10-CM | POA: Diagnosis not present

## 2022-03-14 DIAGNOSIS — F03C0 Unspecified dementia, severe, without behavioral disturbance, psychotic disturbance, mood disturbance, and anxiety: Secondary | ICD-10-CM | POA: Diagnosis not present

## 2022-03-14 DIAGNOSIS — M6281 Muscle weakness (generalized): Secondary | ICD-10-CM | POA: Diagnosis not present

## 2022-03-14 DIAGNOSIS — E785 Hyperlipidemia, unspecified: Secondary | ICD-10-CM | POA: Diagnosis not present

## 2022-03-14 DIAGNOSIS — Z8616 Personal history of COVID-19: Secondary | ICD-10-CM | POA: Diagnosis not present

## 2022-03-16 DIAGNOSIS — E785 Hyperlipidemia, unspecified: Secondary | ICD-10-CM | POA: Diagnosis not present

## 2022-03-16 DIAGNOSIS — I10 Essential (primary) hypertension: Secondary | ICD-10-CM | POA: Diagnosis not present

## 2022-03-16 DIAGNOSIS — G311 Senile degeneration of brain, not elsewhere classified: Secondary | ICD-10-CM | POA: Diagnosis not present

## 2022-03-16 DIAGNOSIS — F03C0 Unspecified dementia, severe, without behavioral disturbance, psychotic disturbance, mood disturbance, and anxiety: Secondary | ICD-10-CM | POA: Diagnosis not present

## 2022-03-16 DIAGNOSIS — Z8616 Personal history of COVID-19: Secondary | ICD-10-CM | POA: Diagnosis not present

## 2022-03-16 DIAGNOSIS — M6281 Muscle weakness (generalized): Secondary | ICD-10-CM | POA: Diagnosis not present

## 2022-03-19 DIAGNOSIS — F03C0 Unspecified dementia, severe, without behavioral disturbance, psychotic disturbance, mood disturbance, and anxiety: Secondary | ICD-10-CM | POA: Diagnosis not present

## 2022-03-19 DIAGNOSIS — I10 Essential (primary) hypertension: Secondary | ICD-10-CM | POA: Diagnosis not present

## 2022-03-19 DIAGNOSIS — E785 Hyperlipidemia, unspecified: Secondary | ICD-10-CM | POA: Diagnosis not present

## 2022-03-19 DIAGNOSIS — M6281 Muscle weakness (generalized): Secondary | ICD-10-CM | POA: Diagnosis not present

## 2022-03-19 DIAGNOSIS — Z8616 Personal history of COVID-19: Secondary | ICD-10-CM | POA: Diagnosis not present

## 2022-03-19 DIAGNOSIS — G311 Senile degeneration of brain, not elsewhere classified: Secondary | ICD-10-CM | POA: Diagnosis not present

## 2022-03-20 DIAGNOSIS — M6281 Muscle weakness (generalized): Secondary | ICD-10-CM | POA: Diagnosis not present

## 2022-03-20 DIAGNOSIS — R2689 Other abnormalities of gait and mobility: Secondary | ICD-10-CM | POA: Diagnosis not present

## 2022-03-20 DIAGNOSIS — R278 Other lack of coordination: Secondary | ICD-10-CM | POA: Diagnosis not present

## 2022-03-20 DIAGNOSIS — I10 Essential (primary) hypertension: Secondary | ICD-10-CM | POA: Diagnosis not present

## 2022-03-20 DIAGNOSIS — Z8616 Personal history of COVID-19: Secondary | ICD-10-CM | POA: Diagnosis not present

## 2022-03-20 DIAGNOSIS — R52 Pain, unspecified: Secondary | ICD-10-CM | POA: Diagnosis not present

## 2022-03-20 DIAGNOSIS — H04123 Dry eye syndrome of bilateral lacrimal glands: Secondary | ICD-10-CM | POA: Diagnosis not present

## 2022-03-20 DIAGNOSIS — R4702 Dysphasia: Secondary | ICD-10-CM | POA: Diagnosis not present

## 2022-03-20 DIAGNOSIS — F03C0 Unspecified dementia, severe, without behavioral disturbance, psychotic disturbance, mood disturbance, and anxiety: Secondary | ICD-10-CM | POA: Diagnosis not present

## 2022-03-20 DIAGNOSIS — R0602 Shortness of breath: Secondary | ICD-10-CM | POA: Diagnosis not present

## 2022-03-20 DIAGNOSIS — G311 Senile degeneration of brain, not elsewhere classified: Secondary | ICD-10-CM | POA: Diagnosis not present

## 2022-03-20 DIAGNOSIS — E785 Hyperlipidemia, unspecified: Secondary | ICD-10-CM | POA: Diagnosis not present

## 2022-03-20 DIAGNOSIS — J9621 Acute and chronic respiratory failure with hypoxia: Secondary | ICD-10-CM | POA: Diagnosis not present

## 2022-03-21 DIAGNOSIS — M6281 Muscle weakness (generalized): Secondary | ICD-10-CM | POA: Diagnosis not present

## 2022-03-21 DIAGNOSIS — I10 Essential (primary) hypertension: Secondary | ICD-10-CM | POA: Diagnosis not present

## 2022-03-21 DIAGNOSIS — G311 Senile degeneration of brain, not elsewhere classified: Secondary | ICD-10-CM | POA: Diagnosis not present

## 2022-03-21 DIAGNOSIS — Z8616 Personal history of COVID-19: Secondary | ICD-10-CM | POA: Diagnosis not present

## 2022-03-21 DIAGNOSIS — E785 Hyperlipidemia, unspecified: Secondary | ICD-10-CM | POA: Diagnosis not present

## 2022-03-21 DIAGNOSIS — F03C0 Unspecified dementia, severe, without behavioral disturbance, psychotic disturbance, mood disturbance, and anxiety: Secondary | ICD-10-CM | POA: Diagnosis not present

## 2022-03-23 ENCOUNTER — Non-Acute Institutional Stay (SKILLED_NURSING_FACILITY): Payer: Medicare Other | Admitting: Adult Health

## 2022-03-23 ENCOUNTER — Encounter: Payer: Self-pay | Admitting: Adult Health

## 2022-03-23 DIAGNOSIS — I700 Atherosclerosis of aorta: Secondary | ICD-10-CM | POA: Diagnosis not present

## 2022-03-23 DIAGNOSIS — F02C3 Dementia in other diseases classified elsewhere, severe, with mood disturbance: Secondary | ICD-10-CM | POA: Diagnosis not present

## 2022-03-23 DIAGNOSIS — J9611 Chronic respiratory failure with hypoxia: Secondary | ICD-10-CM | POA: Diagnosis not present

## 2022-03-23 DIAGNOSIS — G301 Alzheimer's disease with late onset: Secondary | ICD-10-CM | POA: Diagnosis not present

## 2022-03-23 NOTE — Progress Notes (Signed)
Location:  [place] Room Number: 149-D Place of Service:  SNF (31)   CODE STATUS: DNR  No Known Allergies  Chief Complaint  Patient presents with   Acute Visit    Care plan meeting    HPI:  We have come together for her care plan meeting. BIMS 4/15 mood 0/30. She is nonambulatory with no falls. She requires limited to extensive assist with her adls. She is frequently incontinent of bladder and bowel. Dietary:  weight is 153.4 pounds up 7 pounds. Is on puree diet 50-100%. Therapy none at this time . Activities she spends most of time in the hallway; one on one visits .    She will continue to be followed for her chronic illnesses including:  Aortic atherosclerosis  Chronic respiratory failure with hypoxia   Severe late onset alzheimer's dementia with mood disorder. She continues to be followed by hospice care.      Past Medical History:  Diagnosis Date   Allergic rhinitis 11/25/2016   Arthritis    mild, no known falls uses cane   Dementia ([place])    Hypertension    Neurofibroma 05/29/2017   Right breast skin lesion- pathology neurofibroma     Past Surgical History:  Procedure Laterality Date   MASS EXCISION Right 06/07/2017   Procedure: EXCISION SKIN LESION OF RIGHT BREAST;  Surgeon: Virl Cagey, MD;  Location: [facility];  Service: General;  Laterality: Right;   NO PAST SURGERIES      Social History   Socioeconomic History   Marital status: Widowed    Spouse name: Not on file   Number of children: 0   Years of education: Not on file   Highest education level: Not on file  Occupational History   Not on file  Tobacco Use   Smoking status: Never   Smokeless tobacco: Never  Vaping Use   Vaping Use: Never used  Substance and Sexual Activity   Alcohol use: No   Drug use: No   Sexual activity: Not Currently  Other Topics Concern   Not on file  Social History Narrative   Not on file   Social Determinants of Health   Financial Resource  Strain: Low Risk  (03/31/2018)   Overall Financial Resource Strain (CARDIA)    Difficulty of Paying Living Expenses: Not hard at all  Food Insecurity: No Food Insecurity (03/31/2018)   Hunger Vital Sign    Worried About Running Out of Food in the Last Year: Never true    Ran Out of Food in the Last Year: Never true  Transportation Needs: No Transportation Needs (03/31/2018)   PRAPARE - Hydrologist (Medical): No    Lack of Transportation (Non-Medical): No  Physical Activity: Inactive (03/31/2018)   Exercise Vital Sign    Days of Exercise per Week: 0 days    Minutes of Exercise per Session: 0 min  Stress: No Stress Concern Present (03/31/2018)   [place]    Feeling of Stress : Not at all  Social Connections: Somewhat Isolated (03/31/2018)   Social Connection and Isolation Panel [NHANES]    Frequency of Communication with Friends and Family: More than three times a week    Frequency of Social Gatherings with Friends and Family: Once a week    Attends Religious Services: 1 to 4 times per year    Active Member of Clubs or Organizations: No    Attends  Club or Organization Meetings: Never    Marital Status: Widowed  Intimate Partner Violence: Not At Risk (03/31/2018)   Humiliation, Afraid, Rape, and Kick questionnaire    Fear of Current or Ex-Partner: No    Emotionally Abused: No    Physically Abused: No    Sexually Abused: No   Family History  Problem Relation Age of Onset   Diabetes Sister    Hypertension Sister       VITAL SIGNS BP 122/80   Pulse 62   Temp (!) 97.3 F (36.3 C)   Resp 20   Ht '5\' 2"'$  (1.575 m)   Wt 157 lb 9.6 oz (71.5 kg)   SpO2 99%   BMI 28.83 kg/m   Outpatient Encounter Medications as of 03/23/2022  Medication Sig   acetaminophen (TYLENOL) 325 MG tablet Take 650 mg by mouth every 6 (six) hours as needed.   albuterol (PROVENTIL) (2.5 MG/3ML) 0.083% nebulizer  solution Take 2.5 mg by nebulization every 4 (four) hours as needed for wheezing or shortness of breath.   aspirin EC 81 MG tablet Take 1 tablet (81 mg total) by mouth daily.   NON FORMULARY Diet: Dysphagia 1 (puree) with thin liquids   polyethylene glycol (MIRALAX / GLYCOLAX) 17 g packet Take 17 g by mouth daily.   polyvinyl alcohol (LIQUIFILM TEARS) 1.4 % ophthalmic solution Place 1 drop into both eyes in the morning and at bedtime. Wait at least 5 minutes between multiple drops in same eye.   [DISCONTINUED] Calcium Carb-Cholecalciferol 600-10 MG-MCG TABS Take 400 Units by mouth daily.   No facility-administered encounter medications on file as of 03/23/2022.     SIGNIFICANT DIAGNOSTIC EXAMS   LABS REVIEWED:   02-15-21: wbc 8.4; hgb 11.4; hct 36.1; mcv 98,1 plt 143 12-08-21: glucose 79; bun 12; creat 0.79; k+ 3.6; na++ 141; ca 8.7; GFR>60; protein 5.7 albumin 2.9   NO NEW LABS.   Review of Systems  Unable to perform ROS: Dementia   Physical Exam Constitutional:      General: She is not in acute distress.    Appearance: She is well-developed. She is not diaphoretic.  Neck:     Thyroid: No thyromegaly.  Cardiovascular:     Rate and Rhythm: Normal rate and regular rhythm.     Pulses: Normal pulses.     Heart sounds: Normal heart sounds.  Pulmonary:     Effort: Pulmonary effort is normal. No respiratory distress.     Breath sounds: Normal breath sounds.  Abdominal:     General: Bowel sounds are normal. There is no distension.     Palpations: Abdomen is soft.     Tenderness: There is no abdominal tenderness.  Musculoskeletal:        General: Normal range of motion.     Cervical back: Neck supple.     Right lower leg: No edema.     Left lower leg: No edema.  Lymphadenopathy:     Cervical: No cervical adenopathy.  Skin:    General: Skin is warm and dry.  Neurological:     Mental Status: She is alert. Mental status is at baseline.  Psychiatric:        Mood and Affect: Mood  normal.       ASSESSMENT/ PLAN:  TODAY  Aortic atherosclerosis  Chronic respiratory failure with hypoxia  Severe late onset alzheimer's dementia with mood disorder  Will continue current medications Will continue current plan of care Will continue to monitor her status.  Time spent with patient: 40 minutes: plan of care; medications; dietary    Ok Edwards NP [facility]   call [phone]

## 2022-03-27 ENCOUNTER — Non-Acute Institutional Stay (SKILLED_NURSING_FACILITY): Payer: Medicare Other | Admitting: Adult Health

## 2022-03-27 ENCOUNTER — Encounter: Payer: Self-pay | Admitting: Adult Health

## 2022-03-27 DIAGNOSIS — E785 Hyperlipidemia, unspecified: Secondary | ICD-10-CM

## 2022-03-27 DIAGNOSIS — J9611 Chronic respiratory failure with hypoxia: Secondary | ICD-10-CM | POA: Diagnosis not present

## 2022-03-27 DIAGNOSIS — K5909 Other constipation: Secondary | ICD-10-CM | POA: Diagnosis not present

## 2022-03-27 NOTE — Progress Notes (Signed)
Location:  [facility] [place] Room Number: 149 Place of Service:  SNF (31)   CODE STATUS: DNR Hospice  No Known Allergies  Chief Complaint  Patient presents with   Medical Management of Chronic Issues                                         Chronic constipation:  Hyperlipidemia LDL goal <130; Chronic respiratory family with hypoxia:         HPI:  She is a 88 year old long term resident of this facility being seen for the management of her chronic illnesses:  Chronic constipation:  Hyperlipidemia LDL goal <130; Chronic respiratory family with hypoxia: . There are no reports of uncontrolled pain. There are no reports of anxiety or agitation present   Past Medical History:  Diagnosis Date   Allergic rhinitis 11/25/2016   Arthritis    mild, no known falls uses cane   Dementia ([place])    Hypertension    Neurofibroma 05/29/2017   Right breast skin lesion- pathology neurofibroma     Past Surgical History:  Procedure Laterality Date   MASS EXCISION Right 06/07/2017   Procedure: EXCISION SKIN LESION OF RIGHT BREAST;  Surgeon: Virl Cagey, MD;  Location: [facility];  Service: General;  Laterality: Right;   NO PAST SURGERIES      Social History   Socioeconomic History   Marital status: Widowed    Spouse name: Not on file   Number of children: 0   Years of education: Not on file   Highest education level: Not on file  Occupational History   Not on file  Tobacco Use   Smoking status: Never   Smokeless tobacco: Never  Vaping Use   Vaping Use: Never used  Substance and Sexual Activity   Alcohol use: No   Drug use: No   Sexual activity: Not Currently  Other Topics Concern   Not on file  Social History Narrative   Not on file   Social Determinants of Health   Financial Resource Strain: Low Risk  (03/31/2018)   Overall Financial Resource Strain (CARDIA)    Difficulty of Paying Living Expenses: Not hard at all  Food Insecurity: No Food Insecurity  (03/31/2018)   Hunger Vital Sign    Worried About Running Out of Food in the Last Year: Never true    Ran Out of Food in the Last Year: Never true  Transportation Needs: No Transportation Needs (03/31/2018)   PRAPARE - Hydrologist (Medical): No    Lack of Transportation (Non-Medical): No  Physical Activity: Inactive (03/31/2018)   Exercise Vital Sign    Days of Exercise per Week: 0 days    Minutes of Exercise per Session: 0 min  Stress: No Stress Concern Present (03/31/2018)   [place]    Feeling of Stress : Not at all  Social Connections: Somewhat Isolated (03/31/2018)   Social Connection and Isolation Panel [NHANES]    Frequency of Communication with Friends and Family: More than three times a week    Frequency of Social Gatherings with Friends and Family: Once a week    Attends Religious Services: 1 to 4 times per year    Active Member of [employer] or Organizations: No    Attends Archivist Meetings: Never  Marital Status: Widowed  Intimate Partner Violence: Not At Risk (03/31/2018)   Humiliation, Afraid, Rape, and Kick questionnaire    Fear of Current or Ex-Partner: No    Emotionally Abused: No    Physically Abused: No    Sexually Abused: No   Family History  Problem Relation Age of Onset   Diabetes Sister    Hypertension Sister       VITAL SIGNS BP (!) 136/56   Pulse 97   Temp 98.2 F (36.8 C)   Resp 14   Ht '5\' 2"'$  (1.575 m)   Wt 157 lb 9.6 oz (71.5 kg)   SpO2 93%   BMI 28.83 kg/m   Outpatient Encounter Medications as of 03/27/2022  Medication Sig   acetaminophen (TYLENOL) 325 MG tablet Take 650 mg by mouth every 6 (six) hours as needed.   albuterol (PROVENTIL) (2.5 MG/3ML) 0.083% nebulizer solution Take 2.5 mg by nebulization every 4 (four) hours as needed for wheezing or shortness of breath.   aspirin EC 81 MG tablet Take 1 tablet (81 mg total) by mouth daily.    NON FORMULARY Diet: Dysphagia 1 (puree) with thin liquids   polyethylene glycol (MIRALAX / GLYCOLAX) 17 g packet Take 17 g by mouth daily.   polyvinyl alcohol (LIQUIFILM TEARS) 1.4 % ophthalmic solution Place 1 drop into both eyes in the morning and at bedtime. Wait at least 5 minutes between multiple drops in same eye.   No facility-administered encounter medications on file as of 03/27/2022.     SIGNIFICANT DIAGNOSTIC EXAMS   LABS REVIEWED:   02-15-21: wbc 8.4; hgb 11.4; hct 36.1; mcv 98,1 plt 143 12-08-21: glucose 79; bun 12; creat 0.79; k+ 3.6; na++ 141; ca 8.7; GFR>60; protein 5.7 albumin 2.9   NO NEW LABS.   Review of Systems  Unable to perform ROS: Dementia    Physical Exam Constitutional:      General: She is not in acute distress.    Appearance: She is well-developed. She is not diaphoretic.  Neck:     Thyroid: No thyromegaly.  Cardiovascular:     Rate and Rhythm: Normal rate and regular rhythm.     Pulses: Normal pulses.     Heart sounds: Normal heart sounds.  Pulmonary:     Effort: Pulmonary effort is normal. No respiratory distress.     Breath sounds: Normal breath sounds.  Abdominal:     General: Bowel sounds are normal. There is no distension.     Palpations: Abdomen is soft.     Tenderness: There is no abdominal tenderness.  Musculoskeletal:        General: Normal range of motion.     Cervical back: Neck supple.     Right lower leg: No edema.     Left lower leg: No edema.  Lymphadenopathy:     Cervical: No cervical adenopathy.  Skin:    General: Skin is warm and dry.  Neurological:     Mental Status: She is alert. Mental status is at baseline.  Psychiatric:        Mood and Affect: Mood normal.      ASSESSMENT/ PLAN:  TODAY  Chronic constipation: will continue miralax daily   2. Hyperlipidemia LDL goal <130; due to her advanced age is off lipitor  3. Chronic respiratory family with hypoxia: has albuterol neb every 6 hours as needed    PREVIOUS   4. Severe late onset alzheimer's disease without behavioral disturbance; psychotic disturbance; mood disturbance or anxiety: her  weight is 157 pounds; she is off medications will monitor   5. Essential hypertension: b/p 136/56 is off norvasc   6. Aortic atherosclerosis (ct 11-16-16) asa 81 mg daily      Ok Edwards NP [facility]   call [phone]

## 2022-03-28 DIAGNOSIS — G311 Senile degeneration of brain, not elsewhere classified: Secondary | ICD-10-CM | POA: Diagnosis not present

## 2022-03-28 DIAGNOSIS — E785 Hyperlipidemia, unspecified: Secondary | ICD-10-CM | POA: Diagnosis not present

## 2022-03-28 DIAGNOSIS — I10 Essential (primary) hypertension: Secondary | ICD-10-CM | POA: Diagnosis not present

## 2022-03-28 DIAGNOSIS — Z8616 Personal history of COVID-19: Secondary | ICD-10-CM | POA: Diagnosis not present

## 2022-03-28 DIAGNOSIS — F03C0 Unspecified dementia, severe, without behavioral disturbance, psychotic disturbance, mood disturbance, and anxiety: Secondary | ICD-10-CM | POA: Diagnosis not present

## 2022-03-28 DIAGNOSIS — M6281 Muscle weakness (generalized): Secondary | ICD-10-CM | POA: Diagnosis not present

## 2022-04-02 DIAGNOSIS — F03C0 Unspecified dementia, severe, without behavioral disturbance, psychotic disturbance, mood disturbance, and anxiety: Secondary | ICD-10-CM | POA: Diagnosis not present

## 2022-04-02 DIAGNOSIS — Z8616 Personal history of COVID-19: Secondary | ICD-10-CM | POA: Diagnosis not present

## 2022-04-02 DIAGNOSIS — E785 Hyperlipidemia, unspecified: Secondary | ICD-10-CM | POA: Diagnosis not present

## 2022-04-02 DIAGNOSIS — I10 Essential (primary) hypertension: Secondary | ICD-10-CM | POA: Diagnosis not present

## 2022-04-02 DIAGNOSIS — M6281 Muscle weakness (generalized): Secondary | ICD-10-CM | POA: Diagnosis not present

## 2022-04-02 DIAGNOSIS — G311 Senile degeneration of brain, not elsewhere classified: Secondary | ICD-10-CM | POA: Diagnosis not present

## 2022-04-04 DIAGNOSIS — M6281 Muscle weakness (generalized): Secondary | ICD-10-CM | POA: Diagnosis not present

## 2022-04-04 DIAGNOSIS — Z8616 Personal history of COVID-19: Secondary | ICD-10-CM | POA: Diagnosis not present

## 2022-04-04 DIAGNOSIS — G311 Senile degeneration of brain, not elsewhere classified: Secondary | ICD-10-CM | POA: Diagnosis not present

## 2022-04-04 DIAGNOSIS — E785 Hyperlipidemia, unspecified: Secondary | ICD-10-CM | POA: Diagnosis not present

## 2022-04-04 DIAGNOSIS — I10 Essential (primary) hypertension: Secondary | ICD-10-CM | POA: Diagnosis not present

## 2022-04-04 DIAGNOSIS — F03C0 Unspecified dementia, severe, without behavioral disturbance, psychotic disturbance, mood disturbance, and anxiety: Secondary | ICD-10-CM | POA: Diagnosis not present

## 2022-04-05 DIAGNOSIS — G311 Senile degeneration of brain, not elsewhere classified: Secondary | ICD-10-CM | POA: Diagnosis not present

## 2022-04-05 DIAGNOSIS — I10 Essential (primary) hypertension: Secondary | ICD-10-CM | POA: Diagnosis not present

## 2022-04-05 DIAGNOSIS — Z8616 Personal history of COVID-19: Secondary | ICD-10-CM | POA: Diagnosis not present

## 2022-04-05 DIAGNOSIS — M6281 Muscle weakness (generalized): Secondary | ICD-10-CM | POA: Diagnosis not present

## 2022-04-05 DIAGNOSIS — F03C0 Unspecified dementia, severe, without behavioral disturbance, psychotic disturbance, mood disturbance, and anxiety: Secondary | ICD-10-CM | POA: Diagnosis not present

## 2022-04-05 DIAGNOSIS — E785 Hyperlipidemia, unspecified: Secondary | ICD-10-CM | POA: Diagnosis not present

## 2022-04-09 DIAGNOSIS — I10 Essential (primary) hypertension: Secondary | ICD-10-CM | POA: Diagnosis not present

## 2022-04-09 DIAGNOSIS — F03C0 Unspecified dementia, severe, without behavioral disturbance, psychotic disturbance, mood disturbance, and anxiety: Secondary | ICD-10-CM | POA: Diagnosis not present

## 2022-04-09 DIAGNOSIS — M6281 Muscle weakness (generalized): Secondary | ICD-10-CM | POA: Diagnosis not present

## 2022-04-09 DIAGNOSIS — G311 Senile degeneration of brain, not elsewhere classified: Secondary | ICD-10-CM | POA: Diagnosis not present

## 2022-04-09 DIAGNOSIS — Z8616 Personal history of COVID-19: Secondary | ICD-10-CM | POA: Diagnosis not present

## 2022-04-09 DIAGNOSIS — E785 Hyperlipidemia, unspecified: Secondary | ICD-10-CM | POA: Diagnosis not present

## 2022-04-11 DIAGNOSIS — I10 Essential (primary) hypertension: Secondary | ICD-10-CM | POA: Diagnosis not present

## 2022-04-11 DIAGNOSIS — M6281 Muscle weakness (generalized): Secondary | ICD-10-CM | POA: Diagnosis not present

## 2022-04-11 DIAGNOSIS — G311 Senile degeneration of brain, not elsewhere classified: Secondary | ICD-10-CM | POA: Diagnosis not present

## 2022-04-11 DIAGNOSIS — Z8616 Personal history of COVID-19: Secondary | ICD-10-CM | POA: Diagnosis not present

## 2022-04-11 DIAGNOSIS — E785 Hyperlipidemia, unspecified: Secondary | ICD-10-CM | POA: Diagnosis not present

## 2022-04-11 DIAGNOSIS — F03C0 Unspecified dementia, severe, without behavioral disturbance, psychotic disturbance, mood disturbance, and anxiety: Secondary | ICD-10-CM | POA: Diagnosis not present

## 2022-04-16 DIAGNOSIS — I10 Essential (primary) hypertension: Secondary | ICD-10-CM | POA: Diagnosis not present

## 2022-04-16 DIAGNOSIS — Z8616 Personal history of COVID-19: Secondary | ICD-10-CM | POA: Diagnosis not present

## 2022-04-16 DIAGNOSIS — E785 Hyperlipidemia, unspecified: Secondary | ICD-10-CM | POA: Diagnosis not present

## 2022-04-16 DIAGNOSIS — F03C0 Unspecified dementia, severe, without behavioral disturbance, psychotic disturbance, mood disturbance, and anxiety: Secondary | ICD-10-CM | POA: Diagnosis not present

## 2022-04-16 DIAGNOSIS — G311 Senile degeneration of brain, not elsewhere classified: Secondary | ICD-10-CM | POA: Diagnosis not present

## 2022-04-16 DIAGNOSIS — M6281 Muscle weakness (generalized): Secondary | ICD-10-CM | POA: Diagnosis not present

## 2022-04-26 ENCOUNTER — Encounter: Payer: Self-pay | Admitting: Adult Health

## 2022-04-26 ENCOUNTER — Non-Acute Institutional Stay (SKILLED_NURSING_FACILITY): Payer: Medicare Other | Admitting: Adult Health

## 2022-04-26 DIAGNOSIS — F02C3 Dementia in other diseases classified elsewhere, severe, with mood disturbance: Secondary | ICD-10-CM | POA: Diagnosis not present

## 2022-04-26 DIAGNOSIS — G301 Alzheimer's disease with late onset: Secondary | ICD-10-CM | POA: Diagnosis not present

## 2022-04-26 DIAGNOSIS — I10 Essential (primary) hypertension: Secondary | ICD-10-CM

## 2022-04-26 DIAGNOSIS — I700 Atherosclerosis of aorta: Secondary | ICD-10-CM

## 2022-04-26 NOTE — Progress Notes (Signed)
Location:  [facility] Room Number: 149 Place of Service:  SNF (31)   CODE STATUS: dnr   No Known Allergies  Chief Complaint  Patient presents with   Medical Management of Chronic Issues                 Severe late onset alzheimer's disease without behavioral disturbance psychotic disturbance; mood disturbance or anxiety:  Essential hypertension:  Aortic atherosclerosis    HPI:  She is a 88 year old long term resident of this facility being seen for the management of her chronic illnesses:  Severe late onset alzheimer's disease without behavioral disturbance psychotic disturbance; mood disturbance or anxiety:  Essential hypertension:  Aortic atherosclerosis. She is off hospice services. There are no reports of uncontrolled pain; weight is stable; no reports of anxiety or agitation.   Past Medical History:  Diagnosis Date   Allergic rhinitis 11/25/2016   Arthritis    mild, no known falls uses cane   Dementia ([place])    Hypertension    Neurofibroma 05/29/2017   Right breast skin lesion- pathology neurofibroma     Past Surgical History:  Procedure Laterality Date   MASS EXCISION Right 06/07/2017   Procedure: EXCISION SKIN LESION OF RIGHT BREAST;  Surgeon: Virl Cagey, MD;  Location: [facility];  Service: General;  Laterality: Right;   NO PAST SURGERIES      Social History   Socioeconomic History   Marital status: Widowed    Spouse name: Not on file   Number of children: 0   Years of education: Not on file   Highest education level: Not on file  Occupational History   Not on file  Tobacco Use   Smoking status: Never   Smokeless tobacco: Never  Vaping Use   Vaping Use: Never used  Substance and Sexual Activity   Alcohol use: No   Drug use: No   Sexual activity: Not Currently  Other Topics Concern   Not on file  Social History Narrative   Not on file   Social Determinants of Health   Financial Resource Strain: Low Risk  (03/31/2018)    Overall Financial Resource Strain (CARDIA)    Difficulty of Paying Living Expenses: Not hard at all  Food Insecurity: No Food Insecurity (03/31/2018)   Hunger Vital Sign    Worried About Running Out of Food in the Last Year: Never true    Ran Out of Food in the Last Year: Never true  Transportation Needs: No Transportation Needs (03/31/2018)   PRAPARE - Hydrologist (Medical): No    Lack of Transportation (Non-Medical): No  Physical Activity: Inactive (03/31/2018)   Exercise Vital Sign    Days of Exercise per Week: 0 days    Minutes of Exercise per Session: 0 min  Stress: No Stress Concern Present (03/31/2018)   [place]    Feeling of Stress : Not at all  Social Connections: Somewhat Isolated (03/31/2018)   Social Connection and Isolation Panel [NHANES]    Frequency of Communication with Friends and Family: More than three times a week    Frequency of Social Gatherings with Friends and Family: Once a week    Attends Religious Services: 1 to 4 times per year    Active Member of [employer] or Organizations: No    Attends Archivist Meetings: Never    Marital Status: Widowed  Intimate Partner Violence: Not  At Risk (03/31/2018)   Humiliation, Afraid, Rape, and Kick questionnaire    Fear of Current or Ex-Partner: No    Emotionally Abused: No    Physically Abused: No    Sexually Abused: No   Family History  Problem Relation Age of Onset   Diabetes Sister    Hypertension Sister       VITAL SIGNS BP (!) 145/69   Pulse (!) 56   Temp 98.3 F (36.8 C)   Resp 20   Ht '5\' 2"'$  (1.575 m)   Wt 162 lb (73.5 kg)   SpO2 95%   BMI 29.63 kg/m   Outpatient Encounter Medications as of 04/26/2022  Medication Sig   acetaminophen (TYLENOL) 325 MG tablet Take 650 mg by mouth every 6 (six) hours as needed.   albuterol (PROVENTIL) (2.5 MG/3ML) 0.083% nebulizer solution Take 2.5 mg by nebulization  every 4 (four) hours as needed for wheezing or shortness of breath.   aspirin EC 81 MG tablet Take 1 tablet (81 mg total) by mouth daily.   NON FORMULARY Diet: Dysphagia 1 (puree) with thin liquids   polyethylene glycol (MIRALAX / GLYCOLAX) 17 g packet Take 17 g by mouth daily.   polyvinyl alcohol (LIQUIFILM TEARS) 1.4 % ophthalmic solution Place 1 drop into both eyes in the morning and at bedtime. Wait at least 5 minutes between multiple drops in same eye.   No facility-administered encounter medications on file as of 04/26/2022.     SIGNIFICANT DIAGNOSTIC EXAMS  LABS REVIEWED:   02-15-21: wbc 8.4; hgb 11.4; hct 36.1; mcv 98,1 plt 143 12-08-21: glucose 79; bun 12; creat 0.79; k+ 3.6; na++ 141; ca 8.7; GFR>60; protein 5.7 albumin 2.9   NO NEW LABS.   Review of Systems  Unable to perform ROS: Dementia   Physical Exam Constitutional:      General: She is not in acute distress.    Appearance: She is well-developed. She is not diaphoretic.  Neck:     Thyroid: No thyromegaly.  Cardiovascular:     Rate and Rhythm: Normal rate and regular rhythm.     Pulses: Normal pulses.     Heart sounds: Normal heart sounds.  Pulmonary:     Effort: Pulmonary effort is normal. No respiratory distress.     Breath sounds: Normal breath sounds.  Abdominal:     General: Bowel sounds are normal. There is no distension.     Palpations: Abdomen is soft.     Tenderness: There is no abdominal tenderness.  Musculoskeletal:        General: Normal range of motion.     Cervical back: Neck supple.     Right lower leg: No edema.     Left lower leg: No edema.  Lymphadenopathy:     Cervical: No cervical adenopathy.  Skin:    General: Skin is warm and dry.  Neurological:     Mental Status: She is alert. Mental status is at baseline.  Psychiatric:        Mood and Affect: Mood normal.      ASSESSMENT/ PLAN:  TODAY  Severe late onset alzheimer's disease without behavioral disturbance psychotic  disturbance; mood disturbance or anxiety: weight is 162 pounds; is off medications  2. Essential hypertension: b/p 145/69 is off norvasc  3. Aortic atherosclerosis (ct 11-16-17) asa 81 mg daily  PREVIOUS   4. Chronic constipation: will continue miralax daily   5. Hyperlipidemia LDL goal <130; due to her advanced age is off lipitor  6.  Chronic respiratory family with hypoxia: has albuterol neb every 6 hours as needed     Will check cbc cmp    Ok Edwards NP [facility]  call [phone]

## 2022-04-30 ENCOUNTER — Encounter: Payer: Self-pay | Admitting: Adult Health

## 2022-04-30 ENCOUNTER — Non-Acute Institutional Stay (SKILLED_NURSING_FACILITY): Payer: Medicare Other | Admitting: Adult Health

## 2022-04-30 ENCOUNTER — Other Ambulatory Visit (HOSPITAL_COMMUNITY)
Admission: RE | Admit: 2022-04-30 | Discharge: 2022-04-30 | Disposition: A | Discharge status: Home / Self Care | Payer: Medicare Other | Source: Skilled Nursing Facility | Attending: Adult Health | Admitting: Adult Health

## 2022-04-30 DIAGNOSIS — I10 Essential (primary) hypertension: Secondary | ICD-10-CM | POA: Insufficient documentation

## 2022-04-30 DIAGNOSIS — D649 Anemia, unspecified: Secondary | ICD-10-CM | POA: Diagnosis not present

## 2022-04-30 DIAGNOSIS — E43 Unspecified severe protein-calorie malnutrition: Secondary | ICD-10-CM

## 2022-04-30 LAB — COMPREHENSIVE METABOLIC PANEL
ALT: 9 U/L (ref 0–44)
AST: 15 U/L (ref 15–41)
Albumin: 2.7 g/dL — ABNORMAL LOW (ref 3.5–5.0)
Alkaline Phosphatase: 57 U/L (ref 38–126)
Anion gap: 6 (ref 5–15)
BUN: 22 mg/dL (ref 8–23)
CO2: 25 mmol/L (ref 22–32)
Calcium: 8.4 mg/dL — ABNORMAL LOW (ref 8.9–10.3)
Chloride: 108 mmol/L (ref 98–111)
Creatinine, Ser: 0.87 mg/dL (ref 0.44–1.00)
GFR, Estimated: 59 mL/min — ABNORMAL LOW (ref >60)
Glucose, Bld: 96 mg/dL (ref 70–99)
Potassium: 3.8 mmol/L (ref 3.5–5.1)
Sodium: 139 mmol/L (ref 135–145)
Total Bilirubin: 0.6 mg/dL (ref 0.3–1.2)
Total Protein: 5.6 g/dL — ABNORMAL LOW (ref 6.5–8.1)

## 2022-04-30 LAB — CBC
HCT: 32.3 % — ABNORMAL LOW (ref 36.0–46.0)
Hemoglobin: 10 g/dL — ABNORMAL LOW (ref 12.0–15.0)
MCH: 28.6 pg (ref 26.0–34.0)
MCHC: 31 g/dL (ref 30.0–36.0)
MCV: 92.3 fL (ref 80.0–100.0)
Platelets: 201 10*3/uL (ref 150–400)
RBC: 3.5 MIL/uL — ABNORMAL LOW (ref 3.87–5.11)
RDW: 14.6 % (ref 11.5–15.5)
WBC: 5.3 10*3/uL (ref 4.0–10.5)
nRBC: 0 % (ref 0.0–0.2)

## 2022-04-30 NOTE — Progress Notes (Signed)
Location:  [place] Room Number: 149 Place of Service:  SNF (31)   CODE STATUS:    No Known Allergies  Chief Complaint  Patient presents with   Acute Visit    Follow up labs      HPI:  She is no longer being followed by hospice care. Her albumin is ow at 2.7. her hgb has dropped over the past year by 1 gm. There are no reports of increased weakness. No changes in appetite. Her weight is stable.   Past Medical History:  Diagnosis Date   Allergic rhinitis 11/25/2016   Arthritis    mild, no known falls uses cane   Dementia ([place])    Hypertension    Neurofibroma 05/29/2017   Right breast skin lesion- pathology neurofibroma     Past Surgical History:  Procedure Laterality Date   MASS EXCISION Right 06/07/2017   Procedure: EXCISION SKIN LESION OF RIGHT BREAST;  Surgeon: Virl Cagey, MD;  Location: [facility];  Service: General;  Laterality: Right;   NO PAST SURGERIES      Social History   Socioeconomic History   Marital status: Widowed    Spouse name: Not on file   Number of children: 0   Years of education: Not on file   Highest education level: Not on file  Occupational History   Not on file  Tobacco Use   Smoking status: Never   Smokeless tobacco: Never  Vaping Use   Vaping Use: Never used  Substance and Sexual Activity   Alcohol use: No   Drug use: No   Sexual activity: Not Currently  Other Topics Concern   Not on file  Social History Narrative   Not on file   Social Determinants of Health   Financial Resource Strain: Low Risk  (03/31/2018)   Overall Financial Resource Strain (CARDIA)    Difficulty of Paying Living Expenses: Not hard at all  Food Insecurity: No Food Insecurity (03/31/2018)   Hunger Vital Sign    Worried About Running Out of Food in the Last Year: Never true    Ran Out of Food in the Last Year: Never true  Transportation Needs: No Transportation Needs (03/31/2018)   PRAPARE - Radiographer, therapeutic (Medical): No    Lack of Transportation (Non-Medical): No  Physical Activity: Inactive (03/31/2018)   Exercise Vital Sign    Days of Exercise per Week: 0 days    Minutes of Exercise per Session: 0 min  Stress: No Stress Concern Present (03/31/2018)   [place]    Feeling of Stress : Not at all  Social Connections: Somewhat Isolated (03/31/2018)   Social Connection and Isolation Panel [NHANES]    Frequency of Communication with Friends and Family: More than three times a week    Frequency of Social Gatherings with Friends and Family: Once a week    Attends Religious Services: 1 to 4 times per year    Active Member of [employer] or Organizations: No    Attends Archivist Meetings: Never    Marital Status: Widowed  Intimate Partner Violence: Not At Risk (03/31/2018)   Humiliation, Afraid, Rape, and Kick questionnaire    Fear of Current or Ex-Partner: No    Emotionally Abused: No    Physically Abused: No    Sexually Abused: No   Family History  Problem Relation Age of Onset   Diabetes Sister  Hypertension Sister       VITAL SIGNS BP (!) 146/68   Pulse 65   Temp 97.9 F (36.6 C)   Resp 20   Ht '5\' 2"'$  (1.575 m)   Wt 162 lb (73.5 kg)   SpO2 93%   BMI 29.63 kg/m   Outpatient Encounter Medications as of 04/30/2022  Medication Sig   acetaminophen (TYLENOL) 325 MG tablet Take 650 mg by mouth every 6 (six) hours as needed.   albuterol (PROVENTIL) (2.5 MG/3ML) 0.083% nebulizer solution Take 2.5 mg by nebulization every 4 (four) hours as needed for wheezing or shortness of breath.   aspirin EC 81 MG tablet Take 1 tablet (81 mg total) by mouth daily.   NON FORMULARY Diet: Dysphagia 1 (puree) with thin liquids   polyethylene glycol (MIRALAX / GLYCOLAX) 17 g packet Take 17 g by mouth daily.   polyvinyl alcohol (LIQUIFILM TEARS) 1.4 % ophthalmic solution Place 1 drop into both eyes in the morning and  at bedtime. Wait at least 5 minutes between multiple drops in same eye.   No facility-administered encounter medications on file as of 04/30/2022.     SIGNIFICANT DIAGNOSTIC EXAMS   LABS REVIEWED:   02-15-21: wbc 8.4; hgb 11.4; hct 36.1; mcv 98,1 plt 143 12-08-21: glucose 79; bun 12; creat 0.79; k+ 3.6; na++ 141; ca 8.7; GFR>60; protein 5.7 albumin 2.9   TODAY  04-30-22: wbc 5.3; hgb 10.0; hct 32.3; mcv 92.3 plt 201; glucose 96; bun 22; creat 0.87; k+ 3.8; na++ 139; ca 8.4; gfr 59; protein 5.6 albumin 2.7   Review of Systems  Unable to perform ROS: Dementia   Physical Exam Constitutional:      General: She is not in acute distress.    Appearance: She is well-developed. She is not diaphoretic.  Neck:     Thyroid: No thyromegaly.  Cardiovascular:     Rate and Rhythm: Normal rate and regular rhythm.     Pulses: Normal pulses.     Heart sounds: Normal heart sounds.  Pulmonary:     Effort: Pulmonary effort is normal. No respiratory distress.     Breath sounds: Normal breath sounds.  Abdominal:     General: Bowel sounds are normal. There is no distension.     Palpations: Abdomen is soft.     Tenderness: There is no abdominal tenderness.  Musculoskeletal:        General: Normal range of motion.     Cervical back: Neck supple.     Right lower leg: No edema.     Left lower leg: No edema.  Lymphadenopathy:     Cervical: No cervical adenopathy.  Skin:    General: Skin is warm and dry.  Neurological:     Mental Status: She is alert. Mental status is at baseline.  Psychiatric:        Mood and Affect: Mood normal.       ASSESSMENT/ PLAN:  TODAY  Severe protein calorie malnutrition Normocytic anemia  Will check iron and b12 levels Will begin prostat 30 mL three times daily  Will monitor her status.    Ok Edwards NP [facility]  call [phone]

## 2022-05-02 DIAGNOSIS — I82431 Acute embolism and thrombosis of right popliteal vein: Secondary | ICD-10-CM | POA: Diagnosis not present

## 2022-05-02 DIAGNOSIS — R2241 Localized swelling, mass and lump, right lower limb: Secondary | ICD-10-CM | POA: Diagnosis not present

## 2022-05-02 DIAGNOSIS — I82451 Acute embolism and thrombosis of right peroneal vein: Secondary | ICD-10-CM | POA: Diagnosis not present

## 2022-05-02 DIAGNOSIS — I82411 Acute embolism and thrombosis of right femoral vein: Secondary | ICD-10-CM | POA: Diagnosis not present

## 2022-05-03 ENCOUNTER — Encounter: Payer: Self-pay | Admitting: Adult Health

## 2022-05-03 ENCOUNTER — Non-Acute Institutional Stay (SKILLED_NURSING_FACILITY): Payer: Medicare Other | Admitting: Adult Health

## 2022-05-03 ENCOUNTER — Other Ambulatory Visit (HOSPITAL_COMMUNITY)
Admission: RE | Admit: 2022-05-03 | Discharge: 2022-05-03 | Disposition: A | Discharge status: Home / Self Care | Payer: Medicare Other | Source: Skilled Nursing Facility | Attending: Adult Health | Admitting: Adult Health

## 2022-05-03 DIAGNOSIS — I82411 Acute embolism and thrombosis of right femoral vein: Secondary | ICD-10-CM

## 2022-05-03 DIAGNOSIS — D509 Iron deficiency anemia, unspecified: Secondary | ICD-10-CM | POA: Diagnosis not present

## 2022-05-03 DIAGNOSIS — I700 Atherosclerosis of aorta: Secondary | ICD-10-CM | POA: Diagnosis not present

## 2022-05-03 DIAGNOSIS — I82401 Acute embolism and thrombosis of unspecified deep veins of right lower extremity: Secondary | ICD-10-CM | POA: Insufficient documentation

## 2022-05-03 DIAGNOSIS — G301 Alzheimer's disease with late onset: Secondary | ICD-10-CM | POA: Diagnosis not present

## 2022-05-03 DIAGNOSIS — J9611 Chronic respiratory failure with hypoxia: Secondary | ICD-10-CM

## 2022-05-03 DIAGNOSIS — F02C3 Dementia in other diseases classified elsewhere, severe, with mood disturbance: Secondary | ICD-10-CM

## 2022-05-03 LAB — IRON AND TIBC
Iron: 41 ug/dL (ref 28–170)
Saturation Ratios: 14 % (ref 10.4–31.8)
TIBC: 303 ug/dL (ref 250–450)
UIBC: 262 ug/dL

## 2022-05-03 LAB — VITAMIN B12: Vitamin B-12: 238 pg/mL (ref 180–914)

## 2022-05-03 NOTE — Progress Notes (Signed)
Location:  [place] Room Number: 149 Place of Service:  SNF (31) Provider: Ok Edwards, NP  CODE STATUS: DNR   No Known Allergies  Chief Complaint  Patient presents with   Acute Visit    Care plan meeting     HPI:  We have come together for her care plan meeting. Family present. BIMS 5/15 mood 0/30. She is nonambulatory and has had no falls. She requires extensive assist to dependent with her adls. She is frequently incontinent of bladder and bowel. Dietary;  weight is 162 pounds. D1 with thin liquids appetite 25-100%. Therapy: none at this time. Activities participates in group activites. She will continue to be followed for her chronic illnesses including:   Aortic atherosclerosis Severe late onset alzheimer's dementia with mood disturbance  Chronic respiratory failure with hypoxia. She has had sudden onset of right lower extremity edema: has tested positive for an acute DVT  Past Medical History:  Diagnosis Date   Allergic rhinitis 11/25/2016   Arthritis    mild, no known falls uses cane   Dementia ([place])    Hypertension    Neurofibroma 05/29/2017   Right breast skin lesion- pathology neurofibroma     Past Surgical History:  Procedure Laterality Date   MASS EXCISION Right 06/07/2017   Procedure: EXCISION SKIN LESION OF RIGHT BREAST;  Surgeon: Virl Cagey, MD;  Location: [facility];  Service: General;  Laterality: Right;   NO PAST SURGERIES      Social History   Socioeconomic History   Marital status: Widowed    Spouse name: Not on file   Number of children: 0   Years of education: Not on file   Highest education level: Not on file  Occupational History   Not on file  Tobacco Use   Smoking status: Never   Smokeless tobacco: Never  Vaping Use   Vaping Use: Never used  Substance and Sexual Activity   Alcohol use: No   Drug use: No   Sexual activity: Not Currently  Other Topics Concern   Not on file  Social History Narrative    Not on file   Social Determinants of Health   Financial Resource Strain: Low Risk  (03/31/2018)   Overall Financial Resource Strain (CARDIA)    Difficulty of Paying Living Expenses: Not hard at all  Food Insecurity: No Food Insecurity (03/31/2018)   Hunger Vital Sign    Worried About Running Out of Food in the Last Year: Never true    Ran Out of Food in the Last Year: Never true  Transportation Needs: No Transportation Needs (03/31/2018)   PRAPARE - Hydrologist (Medical): No    Lack of Transportation (Non-Medical): No  Physical Activity: Inactive (03/31/2018)   Exercise Vital Sign    Days of Exercise per Week: 0 days    Minutes of Exercise per Session: 0 min  Stress: No Stress Concern Present (03/31/2018)   [place]    Feeling of Stress : Not at all  Social Connections: Somewhat Isolated (03/31/2018)   Social Connection and Isolation Panel [NHANES]    Frequency of Communication with Friends and Family: More than three times a week    Frequency of Social Gatherings with Friends and Family: Once a week    Attends Religious Services: 1 to 4 times per year    Active Member of [employer] or Organizations: No    Attends [employer]  or Organization Meetings: Never    Marital Status: Widowed  Intimate Partner Violence: Not At Risk (03/31/2018)   Humiliation, Afraid, Rape, and Kick questionnaire    Fear of Current or Ex-Partner: No    Emotionally Abused: No    Physically Abused: No    Sexually Abused: No   Family History  Problem Relation Age of Onset   Diabetes Sister    Hypertension Sister       VITAL SIGNS BP (!) 140/68   Pulse 68   Temp 97.9 F (36.6 C)   Resp 18   Ht '5\' 2"'$  (1.575 m)   Wt 162 lb (73.5 kg)   SpO2 94%   BMI 29.63 kg/m   Outpatient Encounter Medications as of 05/03/2022  Medication Sig   Amino Acids-Protein Hydrolys (FEEDING SUPPLEMENT, PRO-STAT SUGAR FREE 64,) LIQD Take 30  mLs by mouth 3 (three) times daily with meals.   acetaminophen (TYLENOL) 325 MG tablet Take 650 mg by mouth every 6 (six) hours as needed.   albuterol (PROVENTIL) (2.5 MG/3ML) 0.083% nebulizer solution Take 2.5 mg by nebulization every 4 (four) hours as needed for wheezing or shortness of breath.   aspirin EC 81 MG tablet Take 1 tablet (81 mg total) by mouth daily.   NON FORMULARY Diet: Dysphagia 1 (puree) with thin liquids   polyethylene glycol (MIRALAX / GLYCOLAX) 17 g packet Take 17 g by mouth daily.   polyvinyl alcohol (LIQUIFILM TEARS) 1.4 % ophthalmic solution Place 1 drop into both eyes in the morning and at bedtime. Wait at least 5 minutes between multiple drops in same eye.   No facility-administered encounter medications on file as of 05/03/2022.     SIGNIFICANT DIAGNOSTIC EXAMS  TODAY  05-02-22: right lower extremity doppler: acute DVT common femoral vein    LABS REVIEWED:   02-15-21: wbc 8.4; hgb 11.4; hct 36.1; mcv 98,1 plt 143 12-08-21: glucose 79; bun 12; creat 0.79; k+ 3.6; na++ 141; ca 8.7; GFR>60; protein 5.7 albumin 2.9  04-30-22: wbc 5.3; hgb 10.0; hct 32.3; mcv 92.3 plt 201; glucose 96; bun 22; creat 0.87; k+ 3.8; na++ 139; ca 8.4; gfr 59; protein 5.6 albumin 2.7  TODAY  05-03-22: iron 41; tibc 303; vitamin B 12: 238    Review of Systems  Unable to perform ROS: Dementia    Physical Exam Constitutional:      General: She is not in acute distress.    Appearance: She is well-developed. She is not diaphoretic.  Neck:     Thyroid: No thyromegaly.  Cardiovascular:     Rate and Rhythm: Normal rate and regular rhythm.     Pulses: Normal pulses.     Heart sounds: Normal heart sounds.  Pulmonary:     Effort: Pulmonary effort is normal. No respiratory distress.     Breath sounds: Normal breath sounds.  Abdominal:     General: Bowel sounds are normal. There is no distension.     Palpations: Abdomen is soft.     Tenderness: There is no abdominal tenderness.   Musculoskeletal:        General: Normal range of motion.     Cervical back: Neck supple.     Right lower leg: No edema.     Left lower leg: No edema.  Lymphadenopathy:     Cervical: No cervical adenopathy.  Skin:    General: Skin is warm and dry.  Neurological:     Mental Status: She is alert. Mental status is at baseline.  Psychiatric:        Mood and Affect: Mood normal.       ASSESSMENT/ PLAN:  TODAY  Aortic atherosclerosis Severe late onset alzheimer's dementia with mood disturbance Chronic respiratory failure with hypoxia Acute deep vein dvt (deep vein thrombosis) of femoral vein of right lower extremity   Will continue current medications;in addition to:  has started on 325 mg daily asa; due to her advanced age she is not a good candidate for anticoagulation therapy will start vitamin B 12 1000 mcg daily  Will continue current plan of care Will continue to monitor her status.    Time spent with patient: 40 minutes: medications; plan of care; dietary    Ok Edwards NP [facility]   [phone]

## 2022-05-04 ENCOUNTER — Encounter: Payer: Self-pay | Admitting: Adult Health

## 2022-05-04 ENCOUNTER — Non-Acute Institutional Stay (SKILLED_NURSING_FACILITY): Payer: Medicare Other | Admitting: Adult Health

## 2022-05-04 DIAGNOSIS — D509 Iron deficiency anemia, unspecified: Secondary | ICD-10-CM

## 2022-05-04 DIAGNOSIS — I82411 Acute embolism and thrombosis of right femoral vein: Secondary | ICD-10-CM

## 2022-05-04 NOTE — Progress Notes (Signed)
Location:   [place] Room Number: [place]:  SNF (31) Provider:  Eulas Post, NP  Gerlene Fee, NP  Patient Care Team: Gerlene Fee, NP as PCP - General (Geriatric Medicine) Center, [place] ([place])  Extended Emergency Contact Information Primary Emergency Contact: [facility] Address: [address]          [place], [place] [address] Johnnette Litter of [place] Phone: [phone] Mobile Phone: [phone] Relation: Niece  Code Status:  DNR Goals of care: Advanced Directive information    05/04/2022    3:33 PM  Advanced Directives  Does Patient Have a Medical Advance Directive? Yes  Type of Advance Directive Out of facility DNR (pink MOST or yellow form)  Does patient want to make changes to medical advance directive? No - Patient declined  Pre-existing out of facility DNR order (yellow form or pink MOST form) Yellow form placed in chart (order not valid for inpatient use)     Chief Complaint  Patient presents with   Acute Visit    Low hemoglobin    HPI:  Pt is a 88 y.o. female seen today for an acute visit for low hemoglobin, 10.0, down from 11.4 (02/15/21). Anemia work up showed Iron 41, TIBC 303 and vitamin B12 238. She was seen in her room today. She is alert to self, confused to time and date. She has an acute DVT of RLE . POA does not want any treatment for the acute DVT.  She has a PMH of dementia, hyperlipidemia and hypertension.   Past Surgical History:  Procedure Laterality Date   MASS EXCISION Right 06/07/2017   Procedure: EXCISION SKIN LESION OF RIGHT BREAST;  Surgeon: Virl Cagey, MD;  Location: [facility];  Service: General;  Laterality: Right;   NO PAST SURGERIES      No Known Allergies  Allergies as of 05/04/2022   No Known Allergies      Medication List        Accurate as of May 04, 2022  3:34 PM. If you have any questions, ask your nurse or doctor.           acetaminophen 325 MG tablet Commonly known as: TYLENOL Take 650 mg by mouth every 6 (six) hours as needed.   albuterol (2.5 MG/3ML) 0.083% nebulizer solution Commonly known as: PROVENTIL Take 2.5 mg by nebulization every 4 (four) hours as needed for wheezing or shortness of breath.   ascorbic acid 500 MG tablet Commonly known as: VITAMIN C Take 500 mg by mouth daily.   aspirin EC 81 MG tablet Take 1 tablet (81 mg total) by mouth daily.   feeding supplement (PRO-STAT SUGAR FREE 64) Liqd Take 30 mLs by mouth 3 (three) times daily with meals.   ferrous sulfate 325 (65 FE) MG EC tablet Take 325 mg by mouth daily.   NON FORMULARY Diet: Dysphagia 1 (puree) with thin liquids   polyethylene glycol 17 g packet Commonly known as: MIRALAX / GLYCOLAX Take 17 g by mouth daily.   polyvinyl alcohol 1.4 % ophthalmic solution Commonly known as: LIQUIFILM TEARS Place 1 drop into both eyes in the morning and at bedtime. Wait at least 5 minutes between multiple drops in same eye.        Review of Systems  Unable to obtain due to dementia.  Immunization History  Administered Date(s) Administered   Fluad Quad(high Dose 65+) 04/29/2019, 06/08/2020   Influenza,inj,Quad PF,6+ Mos 07/07/2015, 05/10/2016, 05/29/2017,  06/25/2018   Influenza-Unspecified 06/02/2021   Moderna Covid-19 Vaccine Bivalent Booster 71yr & up 03/16/2021   Moderna SARS-COV2 Booster Vaccination 11/16/2020, 08/02/2021   Moderna Sars-Covid-2 Vaccination 10/04/2019, 11/01/2019   PNEUMOCOCCAL CONJUGATE-20 11/30/2021   Pneumococcal Conjugate-13 12/25/2016   Pneumococcal Polysaccharide-23 06/25/2018   Tdap 07/07/2015   Zoster Recombinat (Shingrix) 12/15/2021, 03/14/2022   Pertinent  Health Maintenance Due  Topic Date Due   INFLUENZA VACCINE  03/20/2022   DEXA SCAN  Discontinued      02/13/2021   10:00 PM 02/14/2021    8:45 AM 02/15/2021   12:00 AM 02/15/2021    7:49 AM 12/27/2021   12:03 PM  Fall Risk   Falls in the past year?     0  Was there an injury with Fall?     0  Fall Risk Category Calculator     0  Fall Risk Category     Low  Patient Fall Risk Level High fall risk High fall risk High fall risk High fall risk Low fall risk  Patient at Risk for Falls Due to     Impaired balance/gait;Impaired mobility   Functional Status Survey:    Vitals:   05/04/22 1529  BP: (!) 146/68  Pulse: 65  Resp: 20  Temp: 97.9 F (36.6 C)  SpO2: 93%  Weight: 162 lb (73.5 kg)  Height: '5\' 2"'$  (1.575 m)   Body mass index is 29.63 kg/m. Physical Exam Constitutional:      General: She is not in acute distress.    Appearance: She is obese.  HENT:     Head: Normocephalic and atraumatic.     Nose: Nose normal.     Mouth/Throat:     Mouth: Mucous membranes are moist.  Eyes:     Conjunctiva/sclera: Conjunctivae normal.  Cardiovascular:     Rate and Rhythm: Normal rate and regular rhythm.  Pulmonary:     Effort: Pulmonary effort is normal.     Breath sounds: Normal breath sounds.  Abdominal:     General: Bowel sounds are normal.     Palpations: Abdomen is soft.  Musculoskeletal:        General: Normal range of motion.     Cervical back: Normal range of motion.     Right lower leg: Edema present.     Comments: RLE 2+edema  Skin:    General: Skin is warm and dry.  Neurological:     General: No focal deficit present.     Mental Status: She is disoriented.     Comments: Alert to self, disoriented to time and place.  Psychiatric:        Mood and Affect: Mood normal.        Behavior: Behavior normal.        Thought Content: Thought content normal.        Judgment: Judgment normal.    Labs reviewed: Recent Labs    12/08/21 0802 04/30/22 0600  NA 141 139  K 3.6 3.8  CL 108 108  CO2 27 25  GLUCOSE 79 96  BUN 12 22  CREATININE 0.79 0.87  CALCIUM 8.7* 8.4*   Recent Labs    12/08/21 0802 04/30/22 0600  AST 17 15  ALT 10 9  ALKPHOS 58 57  BILITOT 0.4 0.6  PROT 5.7* 5.6*   ALBUMIN 2.9* 2.7*   Recent Labs    04/30/22 0600  WBC 5.3  HGB 10.0*  HCT 32.3*  MCV 92.3  PLT 201   Lab Results  Component Value Date   TSH 2.17 01/19/2019   Lab Results  Component Value Date   HGBA1C 5.7 (H) 10/26/2015   Lab Results  Component Value Date   CHOL 206 (H) 09/04/2019   HDL 64 09/04/2019   LDLCALC 117 (H) 09/04/2019   TRIG 72 02/11/2021   CHOLHDL 3.2 09/04/2019    Significant Diagnostic Results in last 30 days:  No results found.  Assessment/Plan . 1. Iron deficiency anemia, unspecified iron deficiency anemia type Lab Results  Component Value Date   HGB 10.0 (L) 04/30/2022   -  Iron 41, low -  will start on FeSO4 325 mg 1 tab daily  2. Acute deep vein thrombosis (DVT) of femoral vein of right lower extremity ([place]) -  POA declined treatment   Family/ staff Communication:  Discussed plan of care with charge nurse.  Labs/tests ordered:   None

## 2022-05-09 DIAGNOSIS — G301 Alzheimer's disease with late onset: Secondary | ICD-10-CM | POA: Diagnosis not present

## 2022-05-09 DIAGNOSIS — Z515 Encounter for palliative care: Secondary | ICD-10-CM | POA: Diagnosis not present

## 2022-05-17 DIAGNOSIS — R059 Cough, unspecified: Secondary | ICD-10-CM | POA: Diagnosis not present

## 2022-05-17 DIAGNOSIS — Z03818 Encounter for observation for suspected exposure to other biological agents ruled out: Secondary | ICD-10-CM | POA: Diagnosis not present

## 2022-05-17 DIAGNOSIS — G301 Alzheimer's disease with late onset: Secondary | ICD-10-CM | POA: Diagnosis not present

## 2022-05-17 DIAGNOSIS — J111 Influenza due to unidentified influenza virus with other respiratory manifestations: Secondary | ICD-10-CM | POA: Diagnosis not present

## 2022-05-22 DIAGNOSIS — Z23 Encounter for immunization: Secondary | ICD-10-CM | POA: Diagnosis not present

## 2022-05-28 DIAGNOSIS — G301 Alzheimer's disease with late onset: Secondary | ICD-10-CM | POA: Diagnosis not present

## 2022-05-28 DIAGNOSIS — Z1159 Encounter for screening for other viral diseases: Secondary | ICD-10-CM | POA: Diagnosis not present

## 2022-05-30 ENCOUNTER — Encounter: Payer: Self-pay | Admitting: Internal Medicine

## 2022-05-30 ENCOUNTER — Non-Acute Institutional Stay (SKILLED_NURSING_FACILITY): Payer: Medicare Other | Admitting: Internal Medicine

## 2022-05-30 DIAGNOSIS — F02C3 Dementia in other diseases classified elsewhere, severe, with mood disturbance: Secondary | ICD-10-CM

## 2022-05-30 DIAGNOSIS — I10 Essential (primary) hypertension: Secondary | ICD-10-CM | POA: Diagnosis not present

## 2022-05-30 DIAGNOSIS — G301 Alzheimer's disease with late onset: Secondary | ICD-10-CM

## 2022-05-30 DIAGNOSIS — E43 Unspecified severe protein-calorie malnutrition: Secondary | ICD-10-CM | POA: Diagnosis not present

## 2022-05-30 DIAGNOSIS — D649 Anemia, unspecified: Secondary | ICD-10-CM

## 2022-05-30 NOTE — Progress Notes (Signed)
   NURSING HOME LOCATION:  [facility] ROOM NUMBER:  [id number] D  CODE STATUS:  Full Code   PCP:  Ok Edwards NP  This is a nursing facility follow up visit of chronic medical diagnoses & to document compliance with Regulation 483.30 (c) in The [place] Manual Phase 2 which mandates caregiver visit ( visits can alternate among physician, PA or NP as per statutes) within 10 days of 30 days / 60 days/ 90 days post admission to SNF date    Interim medical record and care since last SNF visit was updated with review of diagnostic studies and change in clinical status since last visit were documented.  HPI: She is a permanent resident of this facility with medical diagnoses of reactive airways disease, allergic rhinitis, DJD, essential hypertension, and vascular dementia. Labs are current as of 05/03/2022 and reveal hypocalcemia with a value of 8.4 and protein/caloric malnutrition with albumin of 2.7 and total protein of 5.6.  There is been minimal progression of CKD with current creatinine of 0.87 and GFR of 59 indicating high stage IIIa CKD.  Normochromic, normocytic anemia is present with H/H of 10/32.3.  Iron panel is normal.  Review of systems: Dementia invalidated responses. She denied any symptoms or signs. She could not identify her room.  Once in the room she recognized herself in a picture on the bulletin board but still was unsure that it was her residence.  She denies any active symptoms.  Her main concern was to locate another resident whom she thought lived in her room but who lived on another wing.  Physical exam:  Pertinent or positive findings: She appears younger than stated age.Oral exam was limited as she was hesitant to open her mouth fully.  Grade 9/7-0 systolic murmur was suggested but she kept talking during the chest auscultation.  There may be slight increase in S2.  Breath sounds were decreased.  Abdomen is protuberant.  She has 1+ edema in the right  lower extremity and one half of the left lower extremity.  When I attempted to listen to her chest again she became agitated, pushing me away stating "no more checks."  She would not follow commands.  General appearance: Adequately nourished; no acute distress, increased work of breathing is present.   Lymphatic: No lymphadenopathy about the head, neck, axilla. Eyes: No conjunctival inflammation or lid edema is present. There is no scleral icterus. Ears:  External ear exam shows no significant lesions or deformities.   Nose:  External nasal examination shows no deformity or inflammation. Nasal mucosa are pink and moist without lesions, exudates Oral exam:  Lips and gums are healthy appearing. There is no oropharyngeal erythema or exudate. Neck:  No thyromegaly, masses, tenderness noted.    Heart:  No gallop, click, rub .  Lungs: without wheezes, rhonchi, rales, rubs. Abdomen: Bowel sounds are normal. Abdomen is soft and nontender with no organomegaly, hernias, masses. GU: Deferred  Extremities:  No cyanosis, clubbing Neurologic exam :Balance, Rhomberg, finger to nose testing could not be completed due to clinical state Skin: Warm & dry w/o tenting. No significant lesions or rash.  See summary under each active problem in the Problem List with associated updated therapeutic plan

## 2022-05-30 NOTE — Assessment & Plan Note (Signed)
Blood pressure is well controlled without antihypertensives.  No change indicated.

## 2022-05-30 NOTE — Assessment & Plan Note (Addendum)
She is on pureed diet & is @ group table for neurocognitively impaired residents. Current albumin 2.7 total protein 5.6.  Nutritionist to follow at [facility].

## 2022-05-30 NOTE — Assessment & Plan Note (Addendum)
See behavioral issue @ 05/30/22 exam. BIMS (Brief Interview for Mental Status) score varies from 00-15 ,15 being normal. Current score is 5.

## 2022-05-30 NOTE — Patient Instructions (Signed)
See assessment and plan under each diagnosis in the problem list and acutely for this visit 

## 2022-05-30 NOTE — Assessment & Plan Note (Signed)
Current H/H is 10.0/32.3; iron panel is normal.  No bleeding dyscrasias reported by staff.

## 2022-06-05 ENCOUNTER — Other Ambulatory Visit (HOSPITAL_COMMUNITY)
Admission: RE | Admit: 2022-06-05 | Discharge: 2022-06-05 | Disposition: A | Discharge status: Home / Self Care | Payer: Medicare Other | Source: Skilled Nursing Facility | Attending: Adult Health | Admitting: Adult Health

## 2022-06-05 ENCOUNTER — Non-Acute Institutional Stay (SKILLED_NURSING_FACILITY): Payer: Medicare Other | Admitting: Adult Health

## 2022-06-05 DIAGNOSIS — D696 Thrombocytopenia, unspecified: Secondary | ICD-10-CM | POA: Diagnosis not present

## 2022-06-05 DIAGNOSIS — D509 Iron deficiency anemia, unspecified: Secondary | ICD-10-CM | POA: Diagnosis not present

## 2022-06-05 DIAGNOSIS — Z515 Encounter for palliative care: Secondary | ICD-10-CM | POA: Diagnosis not present

## 2022-06-05 DIAGNOSIS — G301 Alzheimer's disease with late onset: Secondary | ICD-10-CM | POA: Diagnosis not present

## 2022-06-05 LAB — CBC WITH DIFFERENTIAL/PLATELET
Abs Immature Granulocytes: 0.01 10*3/uL (ref 0.00–0.07)
Basophils Absolute: 0 10*3/uL (ref 0.0–0.1)
Basophils Relative: 0 %
Eosinophils Absolute: 0.1 10*3/uL (ref 0.0–0.5)
Eosinophils Relative: 2 %
HCT: 37.5 % (ref 36.0–46.0)
Hemoglobin: 12.2 g/dL (ref 12.0–15.0)
Immature Granulocytes: 0 %
Lymphocytes Relative: 32 %
Lymphs Abs: 1 10*3/uL (ref 0.7–4.0)
MCH: 31 pg (ref 26.0–34.0)
MCHC: 32.5 g/dL (ref 30.0–36.0)
MCV: 95.2 fL (ref 80.0–100.0)
Monocytes Absolute: 0.4 10*3/uL (ref 0.1–1.0)
Monocytes Relative: 12 %
Neutro Abs: 1.8 10*3/uL (ref 1.7–7.7)
Neutrophils Relative %: 54 %
Platelets: 82 10*3/uL — ABNORMAL LOW (ref 150–400)
RBC: 3.94 MIL/uL (ref 3.87–5.11)
RDW: 16.5 % — ABNORMAL HIGH (ref 11.5–15.5)
WBC: 3.2 10*3/uL — ABNORMAL LOW (ref 4.0–10.5)
nRBC: 0 % (ref 0.0–0.2)

## 2022-06-08 ENCOUNTER — Encounter: Payer: Self-pay | Admitting: Adult Health

## 2022-06-08 NOTE — Progress Notes (Unsigned)
Location:  [place] Room Number: 149 Place of Service:  SNF (31)   CODE STATUS: dnr  No Known Allergies  Chief Complaint  Patient presents with   Acute Visit    Follow up la work     HPI:  She has chronic thrombocytopenia. Her plt count is lower today at 82. She is on asa 325 mg daily for an acute dvt. There are no reports of bleeding present.   Past Medical History:  Diagnosis Date   Allergic rhinitis 11/25/2016   Arthritis    mild, no known falls uses cane   Dementia ([place])    Hypertension    Neurofibroma 05/29/2017   Right breast skin lesion- pathology neurofibroma     Past Surgical History:  Procedure Laterality Date   MASS EXCISION Right 06/07/2017   Procedure: EXCISION SKIN LESION OF RIGHT BREAST;  Surgeon: Virl Cagey, MD;  Location: [facility];  Service: General;  Laterality: Right;    Social History   Socioeconomic History   Marital status: Widowed    Spouse name: Not on file   Number of children: 0   Years of education: Not on file   Highest education level: Not on file  Occupational History   Not on file  Tobacco Use   Smoking status: Never   Smokeless tobacco: Never  Vaping Use   Vaping Use: Never used  Substance and Sexual Activity   Alcohol use: No   Drug use: No   Sexual activity: Not Currently  Other Topics Concern   Not on file  Social History Narrative   Not on file   Social Determinants of Health   Financial Resource Strain: Low Risk  (03/31/2018)   Overall Financial Resource Strain (CARDIA)    Difficulty of Paying Living Expenses: Not hard at all  Food Insecurity: No Food Insecurity (03/31/2018)   Hunger Vital Sign    Worried About Running Out of Food in the Last Year: Never true    Ran Out of Food in the Last Year: Never true  Transportation Needs: No Transportation Needs (03/31/2018)   PRAPARE - Hydrologist (Medical): No    Lack of Transportation (Non-Medical): No   Physical Activity: Inactive (03/31/2018)   Exercise Vital Sign    Days of Exercise per Week: 0 days    Minutes of Exercise per Session: 0 min  Stress: No Stress Concern Present (03/31/2018)   [place]    Feeling of Stress : Not at all  Social Connections: Somewhat Isolated (03/31/2018)   Social Connection and Isolation Panel [NHANES]    Frequency of Communication with Friends and Family: More than three times a week    Frequency of Social Gatherings with Friends and Family: Once a week    Attends Religious Services: 1 to 4 times per year    Active Member of [employer] or Organizations: No    Attends [employer] Meetings: Never    Marital Status: Widowed  Intimate Partner Violence: Not At Risk (03/31/2018)   Humiliation, Afraid, Rape, and Kick questionnaire    Fear of Current or Ex-Partner: No    Emotionally Abused: No    Physically Abused: No    Sexually Abused: No   Family History  Problem Relation Age of Onset   Diabetes Sister    Hypertension Sister       VITAL SIGNS BP (!) 135/57   Pulse Marland Kitchen)  52   Temp 97.8 F (36.6 C)   Resp 20   Ht '5\' 2"'$  (1.575 m)   Wt 162 lb (73.5 kg)   SpO2 94%   BMI 29.63 kg/m   Outpatient Encounter Medications as of 06/05/2022  Medication Sig   acetaminophen (TYLENOL) 325 MG tablet Take 650 mg by mouth every 6 (six) hours as needed.   albuterol (PROVENTIL) (2.5 MG/3ML) 0.083% nebulizer solution Take 2.5 mg by nebulization every 4 (four) hours as needed for wheezing or shortness of breath.   Amino Acids-Protein Hydrolys (FEEDING SUPPLEMENT, PRO-STAT SUGAR FREE 64,) LIQD Take 30 mLs by mouth 3 (three) times daily with meals.   ascorbic acid (VITAMIN C) 500 MG tablet Take 500 mg by mouth daily.   aspirin EC 81 MG tablet Take 1 tablet (81 mg total) by mouth daily.   ferrous sulfate 325 (65 FE) MG EC tablet Take 325 mg by mouth daily.   NON FORMULARY Diet: Dysphagia 1 (puree)  with thin liquids   polyethylene glycol (MIRALAX / GLYCOLAX) 17 g packet Take 17 g by mouth daily.   polyvinyl alcohol (LIQUIFILM TEARS) 1.4 % ophthalmic solution Place 1 drop into both eyes in the morning and at bedtime. Wait at least 5 minutes between multiple drops in same eye.   No facility-administered encounter medications on file as of 06/05/2022.     SIGNIFICANT DIAGNOSTIC EXAMS  PREVIOUS   05-02-22: right lower extremity doppler: acute DVT common femoral vein   NO NEW EXAMS    LABS REVIEWED:   12-08-21: glucose 79; bun 12; creat 0.79; k+ 3.6; na++ 141; ca 8.7; GFR>60; protein 5.7 albumin 2.9  04-30-22: wbc 5.3; hgb 10.0; hct 32.3; mcv 92.3 plt 201; glucose 96; bun 22; creat 0.87; k+ 3.8; na++ 139; ca 8.4; gfr 59; protein 5.6 albumin 2.7 05-03-22: iron 41; tibc 303; vitamin B 12: 238  TODAY  06-05-22: wbc 3.2; hgb 12.2; hct 37.5; mcv 95.2 plt 82     Review of Systems  Unable to perform ROS: Dementia    Physical Exam Constitutional:      General: She is not in acute distress.    Appearance: She is well-developed. She is not diaphoretic.  Neck:     Thyroid: No thyromegaly.  Cardiovascular:     Rate and Rhythm: Normal rate and regular rhythm.     Pulses: Normal pulses.     Heart sounds: Normal heart sounds.  Pulmonary:     Effort: Pulmonary effort is normal. No respiratory distress.     Breath sounds: Normal breath sounds.  Abdominal:     General: Bowel sounds are normal. There is no distension.     Palpations: Abdomen is soft.     Tenderness: There is no abdominal tenderness.  Musculoskeletal:        General: Normal range of motion.     Cervical back: Neck supple.     Right lower leg: No edema.     Left lower leg: No edema.  Lymphadenopathy:     Cervical: No cervical adenopathy.  Skin:    General: Skin is warm and dry.  Neurological:     Mental Status: She is alert. Mental status is at baseline.  Psychiatric:        Mood and Affect: Mood normal.        ASSESSMENT/ PLAN:  TODAY  Thrombocytopenia: current is 82. Will continue to monitor her status.    Ok Edwards NP [facility]   call [phone]

## 2022-06-11 DIAGNOSIS — D696 Thrombocytopenia, unspecified: Secondary | ICD-10-CM | POA: Insufficient documentation

## 2022-06-13 DIAGNOSIS — Z23 Encounter for immunization: Secondary | ICD-10-CM | POA: Diagnosis not present

## 2022-07-11 ENCOUNTER — Non-Acute Institutional Stay (SKILLED_NURSING_FACILITY): Payer: Medicare Other | Admitting: Adult Health

## 2022-07-11 DIAGNOSIS — D696 Thrombocytopenia, unspecified: Secondary | ICD-10-CM | POA: Diagnosis not present

## 2022-07-11 DIAGNOSIS — K5909 Other constipation: Secondary | ICD-10-CM | POA: Diagnosis not present

## 2022-07-11 DIAGNOSIS — E785 Hyperlipidemia, unspecified: Secondary | ICD-10-CM | POA: Diagnosis not present

## 2022-07-16 ENCOUNTER — Encounter: Payer: Self-pay | Admitting: Adult Health

## 2022-07-16 NOTE — Progress Notes (Unsigned)
Location:  [facility] Room Number: 149 Place of Service:  SNF (31)   CODE STATUS: dnr   No Known Allergies  Chief Complaint  Patient presents with   Medical Management of Chronic Issues              Thrombocytopenia: Chronic constipation:  Hyperlipidemia LDL goal <130:    HPI:  She is a 88 year old long term resident of this facility being seen for the management of her chronic illnesses: Thrombocytopenia: Chronic constipation:  Hyperlipidemia LDL goal <130. There are no reports of uncontrolled pain. Her weight is stable at 159 pounds; there are no reports of anxiety present. She is not on therapy at this time.   Past Medical History:  Diagnosis Date   Allergic rhinitis 11/25/2016   Arthritis    mild, no known falls uses cane   Dementia ([place])    Hypertension    Neurofibroma 05/29/2017   Right breast skin lesion- pathology neurofibroma     Past Surgical History:  Procedure Laterality Date   MASS EXCISION Right 06/07/2017   Procedure: EXCISION SKIN LESION OF RIGHT BREAST;  Surgeon: Virl Cagey, MD;  Location: [facility];  Service: General;  Laterality: Right;    Social History   Socioeconomic History   Marital status: Widowed    Spouse name: Not on file   Number of children: 0   Years of education: Not on file   Highest education level: Not on file  Occupational History   Not on file  Tobacco Use   Smoking status: Never   Smokeless tobacco: Never  Vaping Use   Vaping Use: Never used  Substance and Sexual Activity   Alcohol use: No   Drug use: No   Sexual activity: Not Currently  Other Topics Concern   Not on file  Social History Narrative   Not on file   Social Determinants of Health   Financial Resource Strain: Low Risk  (03/31/2018)   Overall Financial Resource Strain (CARDIA)    Difficulty of Paying Living Expenses: Not hard at all  Food Insecurity: No Food Insecurity (03/31/2018)   Hunger Vital Sign    Worried About  Running Out of Food in the Last Year: Never true    Ran Out of Food in the Last Year: Never true  Transportation Needs: No Transportation Needs (03/31/2018)   PRAPARE - Hydrologist (Medical): No    Lack of Transportation (Non-Medical): No  Physical Activity: Inactive (03/31/2018)   Exercise Vital Sign    Days of Exercise per Week: 0 days    Minutes of Exercise per Session: 0 min  Stress: No Stress Concern Present (03/31/2018)   [place]    Feeling of Stress : Not at all  Social Connections: Somewhat Isolated (03/31/2018)   Social Connection and Isolation Panel [NHANES]    Frequency of Communication with Friends and Family: More than three times a week    Frequency of Social Gatherings with Friends and Family: Once a week    Attends Religious Services: 1 to 4 times per year    Active Member of [employer] or Organizations: No    Attends Archivist Meetings: Never    Marital Status: Widowed  Intimate Partner Violence: Not At Risk (03/31/2018)   Humiliation, Afraid, Rape, and Kick questionnaire    Fear of Current or Ex-Partner: No    Emotionally Abused: No  Physically Abused: No    Sexually Abused: No   Family History  Problem Relation Age of Onset   Diabetes Sister    Hypertension Sister       VITAL SIGNS BP (!) 146/80   Pulse 64   Temp (!) 97.1 F (36.2 C)   Resp 20   Ht '5\' 2"'$  (1.575 m)   Wt 159 lb 6.4 oz (72.3 kg)   SpO2 99%   BMI 29.15 kg/m   Outpatient Encounter Medications as of 07/11/2022  Medication Sig   acetaminophen (TYLENOL) 325 MG tablet Take 650 mg by mouth every 6 (six) hours as needed.   albuterol (PROVENTIL) (2.5 MG/3ML) 0.083% nebulizer solution Take 2.5 mg by nebulization every 4 (four) hours as needed for wheezing or shortness of breath.   Amino Acids-Protein Hydrolys (FEEDING SUPPLEMENT, PRO-STAT SUGAR FREE 64,) LIQD Take 30 mLs by mouth 3 (three)  times daily with meals.   ascorbic acid (VITAMIN C) 500 MG tablet Take 500 mg by mouth daily.   aspirin EC 81 MG tablet Take 1 tablet (81 mg total) by mouth daily.   ferrous sulfate 325 (65 FE) MG EC tablet Take 325 mg by mouth daily.   NON FORMULARY Diet: Dysphagia 1 (puree) with thin liquids   polyethylene glycol (MIRALAX / GLYCOLAX) 17 g packet Take 17 g by mouth daily.   polyvinyl alcohol (LIQUIFILM TEARS) 1.4 % ophthalmic solution Place 1 drop into both eyes in the morning and at bedtime. Wait at least 5 minutes between multiple drops in same eye.   No facility-administered encounter medications on file as of 07/11/2022.     SIGNIFICANT DIAGNOSTIC EXAMS  PREVIOUS   05-02-22: right lower extremity doppler: acute DVT common femoral vein   NO NEW EXAMS    LABS REVIEWED:   12-08-21: glucose 79; bun 12; creat 0.79; k+ 3.6; na++ 141; ca 8.7; GFR>60; protein 5.7 albumin 2.9  04-30-22: wbc 5.3; hgb 10.0; hct 32.3; mcv 92.3 plt 201; glucose 96; bun 22; creat 0.87; k+ 3.8; na++ 139; ca 8.4; gfr 59; protein 5.6 albumin 2.7 05-03-22: iron 41; tibc 303; vitamin B 12: 238 06-05-22: wbc 3.2; hgb 12.2; hct 37.5; mcv 95.2 plt 82    NO NEW LABS.    Review of Systems  Unable to perform ROS: Dementia   Physical Exam Constitutional:      General: She is not in acute distress.    Appearance: She is well-developed. She is not diaphoretic.  Neck:     Thyroid: No thyromegaly.  Cardiovascular:     Rate and Rhythm: Normal rate and regular rhythm.     Pulses: Normal pulses.     Heart sounds: Normal heart sounds.  Pulmonary:     Effort: Pulmonary effort is normal. No respiratory distress.     Breath sounds: Normal breath sounds.  Abdominal:     General: Bowel sounds are normal. There is no distension.     Palpations: Abdomen is soft.     Tenderness: There is no abdominal tenderness.  Musculoskeletal:        General: Normal range of motion.     Cervical back: Neck supple.     Right lower leg:  No edema.     Left lower leg: No edema.  Lymphadenopathy:     Cervical: No cervical adenopathy.  Skin:    General: Skin is warm and dry.  Neurological:     Mental Status: She is alert. Mental status is at baseline.  Psychiatric:  Mood and Affect: Mood normal.     ASSESSMENT/ PLAN:  TODAY  Thrombocytopenia: plt 82; will repeat cbc   2. Chronic constipation: will continue miralax daily   3. Hyperlipidemia LDL goal <130: is off lipitor due to her advanced age.   PREVIOUS   4. Chronic respiratory failure  with hypoxia: has albuterol neb every 6 hours as needed   5. Severe late onset alzheimer's disease without behavioral disturbance psychotic disturbance; mood disturbance or anxiety: weight is 159 pounds; is off medications  6. Essential hypertension: b/p 146/80 is off norvasc  7. Aortic atherosclerosis (ct 11-16-17) asa 81 mg daily; no statin due to advanced age    Ok Edwards NP [facility]   call [phone]

## 2022-07-19 ENCOUNTER — Other Ambulatory Visit (HOSPITAL_COMMUNITY)
Admission: RE | Admit: 2022-07-19 | Discharge: 2022-07-19 | Disposition: A | Discharge status: Home / Self Care | Payer: Medicare Other | Source: Skilled Nursing Facility | Attending: Adult Health | Admitting: Adult Health

## 2022-07-19 DIAGNOSIS — D696 Thrombocytopenia, unspecified: Secondary | ICD-10-CM | POA: Diagnosis not present

## 2022-07-19 LAB — CBC WITH DIFFERENTIAL/PLATELET
Abs Immature Granulocytes: 0 10*3/uL (ref 0.00–0.07)
Basophils Absolute: 0 10*3/uL (ref 0.0–0.1)
Basophils Relative: 0 %
Eosinophils Absolute: 0.2 10*3/uL (ref 0.0–0.5)
Eosinophils Relative: 4 %
HCT: 41.1 % (ref 36.0–46.0)
Hemoglobin: 12.8 g/dL (ref 12.0–15.0)
Immature Granulocytes: 0 %
Lymphocytes Relative: 47 %
Lymphs Abs: 2.3 10*3/uL (ref 0.7–4.0)
MCH: 29.4 pg (ref 26.0–34.0)
MCHC: 31.1 g/dL (ref 30.0–36.0)
MCV: 94.3 fL (ref 80.0–100.0)
Monocytes Absolute: 0.4 10*3/uL (ref 0.1–1.0)
Monocytes Relative: 9 %
Neutro Abs: 1.9 10*3/uL (ref 1.7–7.7)
Neutrophils Relative %: 40 %
Platelets: 161 10*3/uL (ref 150–400)
RBC: 4.36 MIL/uL (ref 3.87–5.11)
RDW: 16.7 % — ABNORMAL HIGH (ref 11.5–15.5)
WBC: 4.8 10*3/uL (ref 4.0–10.5)
nRBC: 0 % (ref 0.0–0.2)

## 2022-07-25 DIAGNOSIS — Z515 Encounter for palliative care: Secondary | ICD-10-CM | POA: Diagnosis not present

## 2022-07-25 DIAGNOSIS — G301 Alzheimer's disease with late onset: Secondary | ICD-10-CM | POA: Diagnosis not present

## 2022-07-26 ENCOUNTER — Non-Acute Institutional Stay (SKILLED_NURSING_FACILITY): Payer: Medicare Other | Admitting: Adult Health

## 2022-07-26 ENCOUNTER — Encounter: Payer: Self-pay | Admitting: Adult Health

## 2022-07-26 DIAGNOSIS — J9611 Chronic respiratory failure with hypoxia: Secondary | ICD-10-CM | POA: Diagnosis not present

## 2022-07-26 DIAGNOSIS — G301 Alzheimer's disease with late onset: Secondary | ICD-10-CM

## 2022-07-26 DIAGNOSIS — F02C3 Dementia in other diseases classified elsewhere, severe, with mood disturbance: Secondary | ICD-10-CM | POA: Diagnosis not present

## 2022-07-26 DIAGNOSIS — D696 Thrombocytopenia, unspecified: Secondary | ICD-10-CM | POA: Diagnosis not present

## 2022-07-26 NOTE — Progress Notes (Signed)
Location:  [facility] Room Number: 149 Place of Service:  SNF (31)   CODE STATUS: dnr   No Known Allergies  Chief Complaint  Patient presents with   Acute Visit    Care plan meeting     HPI:  We have come together for her care plan meeting. Family present  BIMS 5/15 mood 0/30. She is nonambulatory with no falls. She requires moderate to maximum assist with her adls. She is frequently incontinent of bladder and bowel. Dietary:  weight is 155.6 pounds; weight stable in the past 5 months; puree with thin liquids; appetite good; feeds self after setup. Therapy: none at this time. Activities: will attend at times. She continues to be followed for her chronic illnesses including: Chronic respiratory failure with hypoxia  Severe late onset alzheimer;s disease with mood disturbance  Thrombocytopenia  Past Medical History:  Diagnosis Date   Allergic rhinitis 11/25/2016   Arthritis    mild, no known falls uses cane   Dementia ([place])    Hypertension    Neurofibroma 05/29/2017   Right breast skin lesion- pathology neurofibroma     Past Surgical History:  Procedure Laterality Date   MASS EXCISION Right 06/07/2017   Procedure: EXCISION SKIN LESION OF RIGHT BREAST;  Surgeon: Virl Cagey, MD;  Location: [facility];  Service: General;  Laterality: Right;    Social History   Socioeconomic History   Marital status: Widowed    Spouse name: Not on file   Number of children: 0   Years of education: Not on file   Highest education level: Not on file  Occupational History   Not on file  Tobacco Use   Smoking status: Never   Smokeless tobacco: Never  Vaping Use   Vaping Use: Never used  Substance and Sexual Activity   Alcohol use: No   Drug use: No   Sexual activity: Not Currently  Other Topics Concern   Not on file  Social History Narrative   Not on file   Social Determinants of Health   Financial Resource Strain: Low Risk  (03/31/2018)   Overall  Financial Resource Strain (CARDIA)    Difficulty of Paying Living Expenses: Not hard at all  Food Insecurity: No Food Insecurity (03/31/2018)   Hunger Vital Sign    Worried About Running Out of Food in the Last Year: Never true    Ran Out of Food in the Last Year: Never true  Transportation Needs: No Transportation Needs (03/31/2018)   PRAPARE - Hydrologist (Medical): No    Lack of Transportation (Non-Medical): No  Physical Activity: Inactive (03/31/2018)   Exercise Vital Sign    Days of Exercise per Week: 0 days    Minutes of Exercise per Session: 0 min  Stress: No Stress Concern Present (03/31/2018)   [place]    Feeling of Stress : Not at all  Social Connections: Somewhat Isolated (03/31/2018)   Social Connection and Isolation Panel [NHANES]    Frequency of Communication with Friends and Family: More than three times a week    Frequency of Social Gatherings with Friends and Family: Once a week    Attends Religious Services: 1 to 4 times per year    Active Member of [employer] or Organizations: No    Attends Archivist Meetings: Never    Marital Status: Widowed  Intimate Partner Violence: Not At Risk (03/31/2018)  Humiliation, Afraid, Rape, and Kick questionnaire    Fear of Current or Ex-Partner: No    Emotionally Abused: No    Physically Abused: No    Sexually Abused: No   Family History  Problem Relation Age of Onset   Diabetes Sister    Hypertension Sister       VITAL SIGNS BP 118/70   Pulse 70   Temp 98 F (36.7 C)   Resp 18   Ht '5\' 2"'$  (1.575 m)   Wt 155 lb 9.6 oz (70.6 kg)   SpO2 96%   BMI 28.46 kg/m   Outpatient Encounter Medications as of 07/26/2022  Medication Sig   acetaminophen (TYLENOL) 325 MG tablet Take 650 mg by mouth every 6 (six) hours as needed.   albuterol (PROVENTIL) (2.5 MG/3ML) 0.083% nebulizer solution Take 2.5 mg by nebulization every 4 (four)  hours as needed for wheezing or shortness of breath.   Amino Acids-Protein Hydrolys (FEEDING SUPPLEMENT, PRO-STAT SUGAR FREE 64,) LIQD Take 30 mLs by mouth 3 (three) times daily with meals.   ascorbic acid (VITAMIN C) 500 MG tablet Take 500 mg by mouth daily.   aspirin EC 81 MG tablet Take 1 tablet (81 mg total) by mouth daily.   ferrous sulfate 325 (65 FE) MG EC tablet Take 325 mg by mouth daily.   NON FORMULARY Diet: Dysphagia 1 (puree) with thin liquids   polyethylene glycol (MIRALAX / GLYCOLAX) 17 g packet Take 17 g by mouth daily.   polyvinyl alcohol (LIQUIFILM TEARS) 1.4 % ophthalmic solution Place 1 drop into both eyes in the morning and at bedtime. Wait at least 5 minutes between multiple drops in same eye.   No facility-administered encounter medications on file as of 07/26/2022.     SIGNIFICANT DIAGNOSTIC EXAMS  PREVIOUS   05-02-22: right lower extremity doppler: acute DVT common femoral vein   NO NEW EXAMS    LABS REVIEWED:   12-08-21: glucose 79; bun 12; creat 0.79; k+ 3.6; na++ 141; ca 8.7; GFR>60; protein 5.7 albumin 2.9  04-30-22: wbc 5.3; hgb 10.0; hct 32.3; mcv 92.3 plt 201; glucose 96; bun 22; creat 0.87; k+ 3.8; na++ 139; ca 8.4; gfr 59; protein 5.6 albumin 2.7 05-03-22: iron 41; tibc 303; vitamin B 12: 238 06-05-22: wbc 3.2; hgb 12.2; hct 37.5; mcv 95.2 plt 82    NO NEW LABS.    Review of Systems  Unable to perform ROS: Dementia   Physical Exam Constitutional:      General: She is not in acute distress.    Appearance: She is well-developed. She is not diaphoretic.  Neck:     Thyroid: No thyromegaly.  Cardiovascular:     Rate and Rhythm: Normal rate and regular rhythm.     Pulses: Normal pulses.     Heart sounds: Normal heart sounds.  Pulmonary:     Effort: Pulmonary effort is normal. No respiratory distress.     Breath sounds: Normal breath sounds.  Abdominal:     General: Bowel sounds are normal. There is no distension.     Palpations: Abdomen is soft.      Tenderness: There is no abdominal tenderness.  Musculoskeletal:        General: Normal range of motion.     Cervical back: Neck supple.     Right lower leg: No edema.     Left lower leg: No edema.  Lymphadenopathy:     Cervical: No cervical adenopathy.  Skin:    General: Skin is  warm and dry.  Neurological:     Mental Status: She is alert. Mental status is at baseline.  Psychiatric:        Mood and Affect: Mood normal.      ASSESSMENT/ PLAN:  TODAY  Chronic respiratory failure with hypoxia Severe late onset alzheimer;s disease with mood disturbance Thrombocytopenia   Will continue current medications Will continue current plan of care Will continue to monitor her status.   Time spent with patient: 40 minutes: medications; dietary; plan of care    Ok Edwards NP [facility]   call [phone]

## 2022-08-17 ENCOUNTER — Encounter: Payer: Self-pay | Admitting: Adult Health

## 2022-08-17 ENCOUNTER — Non-Acute Institutional Stay (SKILLED_NURSING_FACILITY): Payer: Medicare Other | Admitting: Adult Health

## 2022-08-17 DIAGNOSIS — J9611 Chronic respiratory failure with hypoxia: Secondary | ICD-10-CM | POA: Diagnosis not present

## 2022-08-17 DIAGNOSIS — F02C3 Dementia in other diseases classified elsewhere, severe, with mood disturbance: Secondary | ICD-10-CM

## 2022-08-17 DIAGNOSIS — I10 Essential (primary) hypertension: Secondary | ICD-10-CM | POA: Diagnosis not present

## 2022-08-17 DIAGNOSIS — G301 Alzheimer's disease with late onset: Secondary | ICD-10-CM

## 2022-08-17 NOTE — Progress Notes (Signed)
Location:  [place] [facility] Room Number: 149-D Place of Service:  SNF (31)   CODE STATUS: DNR  No Known Allergies  Chief Complaint  Patient presents with   Medical Management of Chronic Issues                         Chronic respiratory failure with hypoxia Severe late onset alzheimer's disease without behavioral disturbance; psychotic disturbance; mood disturbance or anxiety:  Essential hypertension    HPI:  She is a 88 long term resident of this facility being seen for the management of her chronic illnesses: Chronic respiratory failure with hypoxia Severe late onset alzheimer's disease without behavioral disturbance; psychotic disturbance; mood disturbance or anxiety:  Essential hypertension. There are no reports of uncontrolled pain. Her weight is stable; no reports of anxiety or depressive thoughts.   Past Medical History:  Diagnosis Date   Allergic rhinitis 11/25/2016   Arthritis    mild, no known falls uses cane   Dementia ([place])    Hypertension    Neurofibroma 05/29/2017   Right breast skin lesion- pathology neurofibroma     Past Surgical History:  Procedure Laterality Date   MASS EXCISION Right 06/07/2017   Procedure: EXCISION SKIN LESION OF RIGHT BREAST;  Surgeon: Virl Cagey, MD;  Location: [facility];  Service: General;  Laterality: Right;    Social History   Socioeconomic History   Marital status: Widowed    Spouse name: Not on file   Number of children: 0   Years of education: Not on file   Highest education level: Not on file  Occupational History   Not on file  Tobacco Use   Smoking status: Never   Smokeless tobacco: Never  Vaping Use   Vaping Use: Never used  Substance and Sexual Activity   Alcohol use: No   Drug use: No   Sexual activity: Not Currently  Other Topics Concern   Not on file  Social History Narrative   Not on file   Social Determinants of Health   Financial Resource Strain: Low Risk  (03/31/2018)    Overall Financial Resource Strain (CARDIA)    Difficulty of Paying Living Expenses: Not hard at all  Food Insecurity: No Food Insecurity (03/31/2018)   Hunger Vital Sign    Worried About Running Out of Food in the Last Year: Never true    Ran Out of Food in the Last Year: Never true  Transportation Needs: No Transportation Needs (03/31/2018)   PRAPARE - Hydrologist (Medical): No    Lack of Transportation (Non-Medical): No  Physical Activity: Inactive (03/31/2018)   Exercise Vital Sign    Days of Exercise per Week: 0 days    Minutes of Exercise per Session: 0 min  Stress: No Stress Concern Present (03/31/2018)   [place]    Feeling of Stress : Not at all  Social Connections: Somewhat Isolated (03/31/2018)   Social Connection and Isolation Panel [NHANES]    Frequency of Communication with Friends and Family: More than three times a week    Frequency of Social Gatherings with Friends and Family: Once a week    Attends Religious Services: 1 to 4 times per year    Active Member of [employer] or Organizations: No    Attends Archivist Meetings: Never    Marital Status: Widowed  Intimate Partner Violence: Not At Risk (03/31/2018)  Humiliation, Afraid, Rape, and Kick questionnaire    Fear of Current or Ex-Partner: No    Emotionally Abused: No    Physically Abused: No    Sexually Abused: No   Family History  Problem Relation Age of Onset   Diabetes Sister    Hypertension Sister       VITAL SIGNS BP (!) 146/74   Pulse (!) 56   Temp 98 F (36.7 C)   Resp (!) 21   Ht '5\' 2"'$  (1.575 m)   Wt 155 lb 9.6 oz (70.6 kg)   SpO2 96%   BMI 28.46 kg/m   Outpatient Encounter Medications as of 08/17/2022  Medication Sig   acetaminophen (TYLENOL) 325 MG tablet Take 650 mg by mouth every 6 (six) hours as needed.   albuterol (PROVENTIL) (2.5 MG/3ML) 0.083% nebulizer solution Take 2.5 mg by  nebulization every 4 (four) hours as needed for wheezing or shortness of breath.   Amino Acids-Protein Hydrolys (FEEDING SUPPLEMENT, PRO-STAT SUGAR FREE 64,) LIQD Take 30 mLs by mouth 3 (three) times daily with meals.   ascorbic acid (VITAMIN C) 500 MG tablet Take 500 mg by mouth daily.   aspirin EC 81 MG tablet Take 1 tablet (81 mg total) by mouth daily.   ferrous sulfate 325 (65 FE) MG EC tablet Take 325 mg by mouth daily.   Magnesium Hydroxide (MILK OF MAGNESIA PO) Take 30 mLs by mouth once as needed.   NON FORMULARY Diet: Dysphagia 1 (puree) with thin liquids   polyethylene glycol (MIRALAX / GLYCOLAX) 17 g packet Take 17 g by mouth daily.   polyvinyl alcohol (LIQUIFILM TEARS) 1.4 % ophthalmic solution Place 1 drop into both eyes in the morning and at bedtime. Wait at least 5 minutes between multiple drops in same eye.   No facility-administered encounter medications on file as of 08/17/2022.     SIGNIFICANT DIAGNOSTIC EXAMS  PREVIOUS   05-02-22: right lower extremity doppler: acute DVT common femoral vein   NO NEW EXAMS    LABS REVIEWED:   12-08-21: glucose 79; bun 12; creat 0.79; k+ 3.6; na++ 141; ca 8.7; GFR>60; protein 5.7 albumin 2.9  04-30-22: wbc 5.3; hgb 10.0; hct 32.3; mcv 92.3 plt 201; glucose 96; bun 22; creat 0.87; k+ 3.8; na++ 139; ca 8.4; gfr 59; protein 5.6 albumin 2.7 05-03-22: iron 41; tibc 303; vitamin B 12: 238 06-05-22: wbc 3.2; hgb 12.2; hct 37.5; mcv 95.2 plt 82    TODAY  07-19-22: wbc 4.8; hgb 12.8; hct 41.1; mcv 94.3 plt 161      Review of Systems  Unable to perform ROS: Dementia    Physical Exam Constitutional:      General: She is not in acute distress.    Appearance: She is well-developed. She is not diaphoretic.  Neck:     Thyroid: No thyromegaly.  Cardiovascular:     Rate and Rhythm: Normal rate and regular rhythm.     Pulses: Normal pulses.     Heart sounds: Normal heart sounds.  Pulmonary:     Effort: Pulmonary effort is normal. No  respiratory distress.     Breath sounds: Normal breath sounds.  Abdominal:     General: Bowel sounds are normal. There is no distension.     Palpations: Abdomen is soft.     Tenderness: There is no abdominal tenderness.  Musculoskeletal:        General: Normal range of motion.     Cervical back: Neck supple.  Right lower leg: No edema.     Left lower leg: No edema.  Lymphadenopathy:     Cervical: No cervical adenopathy.  Skin:    General: Skin is warm and dry.  Neurological:     Mental Status: She is alert. Mental status is at baseline.  Psychiatric:        Mood and Affect: Mood normal.      ASSESSMENT/ PLAN:  TODAY  Chronic respiratory failure with hypoxia will monitor  2. Severe late onset alzheimer's disease without behavioral disturbance; psychotic disturbance; mood disturbance or anxiety: weight is 158 pounds; will monitor  3. Essential hypertension: b/p 146/74  PREVIOUS   4. Aortic atherosclerosis (ct 11-16-17) asa 81 mg daily; no statin due to advanced age   29. Thrombocytopenia: plt 161; will monitor   6. Chronic constipation: will continue miralax daily   7. Hyperlipidemia LDL goal <130: is off lipitor due to her advanced age.    Ok Edwards NP [facility]   call [phone]

## 2022-08-20 DIAGNOSIS — 419620001 Death: Secondary | SNOMED CT | POA: Diagnosis not present

## 2022-08-20 DEATH — deceased

## 2022-08-24 ENCOUNTER — Non-Acute Institutional Stay (SKILLED_NURSING_FACILITY): Payer: Medicare Other | Admitting: Internal Medicine

## 2022-08-24 ENCOUNTER — Encounter: Payer: Self-pay | Admitting: Internal Medicine

## 2022-08-24 DIAGNOSIS — N1832 Chronic kidney disease, stage 3b: Secondary | ICD-10-CM | POA: Insufficient documentation

## 2022-08-24 DIAGNOSIS — I10 Essential (primary) hypertension: Secondary | ICD-10-CM

## 2022-08-24 DIAGNOSIS — N183 Chronic kidney disease, stage 3 unspecified: Secondary | ICD-10-CM | POA: Insufficient documentation

## 2022-08-24 DIAGNOSIS — D649 Anemia, unspecified: Secondary | ICD-10-CM

## 2022-08-24 DIAGNOSIS — N1831 Chronic kidney disease, stage 3a: Secondary | ICD-10-CM | POA: Diagnosis not present

## 2022-08-24 DIAGNOSIS — D696 Thrombocytopenia, unspecified: Secondary | ICD-10-CM

## 2022-08-24 NOTE — Progress Notes (Unsigned)
   NURSING HOME LOCATION:  [facility] ROOM NUMBER:  [id number] D  CODE STATUS:  Full Code  PCP:  Ok Edwards NP  This is a nursing facility follow up visit of chronic medical diagnoses & to document compliance with Regulation 483.30 (c) in The [place] Manual Phase 2 which mandates caregiver visit ( visits can alternate among physician, PA or NP as per statutes) within 10 days of 30 days / 60 days/ 90 days post admission to SNF date    Interim medical record and care since last SNF visit was updated with review of diagnostic studies and change in clinical status since last visit were documented.  HPI:  Review of systems: Dementia invalidated responses. Date given as   Constitutional: No fever, significant weight change, fatigue  Eyes: No redness, discharge, pain, vision change ENT/mouth: No nasal congestion,  purulent discharge, earache, change in hearing, sore throat  Cardiovascular: No chest pain, palpitations, paroxysmal nocturnal dyspnea, claudication, edema  Respiratory: No cough, sputum production, hemoptysis, DOE, significant snoring, apnea   Gastrointestinal: No heartburn, dysphagia, abdominal pain, nausea /vomiting, rectal bleeding, melena, change in bowels Genitourinary: No dysuria, hematuria, pyuria, incontinence, nocturia Musculoskeletal: No joint stiffness, joint swelling, weakness, pain Dermatologic: No rash, pruritus, change in appearance of skin Neurologic: No dizziness, headache, syncope, seizures, numbness, tingling Psychiatric: No significant anxiety, depression, insomnia, anorexia Endocrine: No change in hair/skin/nails, excessive thirst, excessive hunger, excessive urination  Hematologic/lymphatic: No significant bruising, lymphadenopathy, abnormal bleeding Allergy/immunology: No itchy/watery eyes, significant sneezing, urticaria, angioedema  Physical exam:  Pertinent or positive findings: General appearance: Adequately nourished; no  acute distress, increased work of breathing is present.   Lymphatic: No lymphadenopathy about the head, neck, axilla. Eyes: No conjunctival inflammation or lid edema is present. There is no scleral icterus. Ears:  External ear exam shows no significant lesions or deformities.   Nose:  External nasal examination shows no deformity or inflammation. Nasal mucosa are pink and moist without lesions, exudates Oral exam:  Lips and gums are healthy appearing. There is no oropharyngeal erythema or exudate. Neck:  No thyromegaly, masses, tenderness noted.    Heart:  Normal rate and regular rhythm. S1 and S2 normal without gallop, murmur, click, rub .  Lungs: Chest clear to auscultation without wheezes, rhonchi, rales, rubs. Abdomen: Bowel sounds are normal. Abdomen is soft and nontender with no organomegaly, hernias, masses. GU: Deferred  Extremities:  No cyanosis, clubbing, edema  Neurologic exam : Cn 2-7 intact Strength equal  in upper & lower extremities Balance, Rhomberg, finger to nose testing could not be completed due to clinical state Deep tendon reflexes are equal Skin: Warm & dry w/o tenting. No significant lesions or rash.  See summary under each active problem in the Problem List with associated updated therapeutic plan

## 2022-08-24 NOTE — Assessment & Plan Note (Signed)
BP controlled w/o antihypertensive medications. Continue monitor.

## 2022-08-24 NOTE — Assessment & Plan Note (Signed)
04/30/22 creat 0.87/ GFR 59, CKD high Stage 3A.  Med list reviewed; no indication for change in medications or dosages unless there is progression of CKD.

## 2022-08-24 NOTE — Assessment & Plan Note (Signed)
07/19/22 plat # [id number]

## 2022-08-24 NOTE — Assessment & Plan Note (Signed)
07/19/22 H/H 12.8/41.1 , up from 10/32.3 on 04/30/22.  No bleeding dyscrasias reported at the facility.

## 2022-08-25 NOTE — Patient Instructions (Signed)
See assessment and plan under each diagnosis in the problem list and acutely for this visit 

## 2022-08-27 DIAGNOSIS — Z20828 Contact with and (suspected) exposure to other viral communicable diseases: Secondary | ICD-10-CM | POA: Diagnosis not present

## 2022-08-27 DIAGNOSIS — I739 Peripheral vascular disease, unspecified: Secondary | ICD-10-CM | POA: Diagnosis not present

## 2022-08-27 DIAGNOSIS — G301 Alzheimer's disease with late onset: Secondary | ICD-10-CM | POA: Diagnosis not present

## 2022-08-27 DIAGNOSIS — M2041 Other hammer toe(s) (acquired), right foot: Secondary | ICD-10-CM | POA: Diagnosis not present

## 2022-08-27 DIAGNOSIS — Z1152 Encounter for screening for COVID-19: Secondary | ICD-10-CM | POA: Diagnosis not present

## 2022-08-27 DIAGNOSIS — M2042 Other hammer toe(s) (acquired), left foot: Secondary | ICD-10-CM | POA: Diagnosis not present

## 2022-08-27 DIAGNOSIS — B351 Tinea unguium: Secondary | ICD-10-CM | POA: Diagnosis not present

## 2022-09-13 DIAGNOSIS — G301 Alzheimer's disease with late onset: Secondary | ICD-10-CM | POA: Diagnosis not present

## 2022-09-13 DIAGNOSIS — M6281 Muscle weakness (generalized): Secondary | ICD-10-CM | POA: Diagnosis not present

## 2022-09-13 DIAGNOSIS — Z515 Encounter for palliative care: Secondary | ICD-10-CM | POA: Diagnosis not present

## 2022-09-16 ENCOUNTER — Encounter (HOSPITAL_COMMUNITY)
Admission: RE | Admit: 2022-09-16 | Discharge: 2022-09-16 | Disposition: A | Discharge status: Home / Self Care | Payer: Medicare Other | Source: Skilled Nursing Facility | Attending: Adult Health | Admitting: Adult Health

## 2022-09-16 DIAGNOSIS — D509 Iron deficiency anemia, unspecified: Secondary | ICD-10-CM | POA: Insufficient documentation

## 2022-09-16 LAB — CBC
HCT: 39.6 % (ref 36.0–46.0)
Hemoglobin: 12.4 g/dL (ref 12.0–15.0)
MCH: 30.5 pg (ref 26.0–34.0)
MCHC: 31.3 g/dL (ref 30.0–36.0)
MCV: 97.5 fL (ref 80.0–100.0)
Platelets: 125 10*3/uL — ABNORMAL LOW (ref 150–400)
RBC: 4.06 MIL/uL (ref 3.87–5.11)
RDW: 14.7 % (ref 11.5–15.5)
WBC: 5.1 10*3/uL (ref 4.0–10.5)
nRBC: 0 % (ref 0.0–0.2)

## 2022-09-16 LAB — BASIC METABOLIC PANEL
Anion gap: 8 (ref 5–15)
BUN: 22 mg/dL (ref 8–23)
CO2: 26 mmol/L (ref 22–32)
Calcium: 7.9 mg/dL — ABNORMAL LOW (ref 8.9–10.3)
Chloride: 108 mmol/L (ref 98–111)
Creatinine, Ser: 1.17 mg/dL — ABNORMAL HIGH (ref 0.44–1.00)
GFR, Estimated: 41 mL/min — ABNORMAL LOW (ref >60)
Glucose, Bld: 88 mg/dL (ref 70–99)
Potassium: 3.4 mmol/L — ABNORMAL LOW (ref 3.5–5.1)
Sodium: 142 mmol/L (ref 135–145)

## 2022-09-27 ENCOUNTER — Non-Acute Institutional Stay (SKILLED_NURSING_FACILITY): Payer: Medicare Other | Admitting: Adult Health

## 2022-09-27 ENCOUNTER — Encounter: Payer: Self-pay | Admitting: Adult Health

## 2022-09-27 DIAGNOSIS — D696 Thrombocytopenia, unspecified: Secondary | ICD-10-CM | POA: Diagnosis not present

## 2022-09-27 DIAGNOSIS — K5909 Other constipation: Secondary | ICD-10-CM | POA: Diagnosis not present

## 2022-09-27 DIAGNOSIS — I700 Atherosclerosis of aorta: Secondary | ICD-10-CM | POA: Diagnosis not present

## 2022-09-27 NOTE — Progress Notes (Signed)
Location:  [facility] Room Number: 149D Place of Service:  SNF (31)   CODE STATUS: DNR   No Known Allergies  Chief Complaint  Patient presents with   Medical Management of Chronic Issues                 Aortic atherosclerosis Thrombocytopenia:Chronic constipation    HPI:  She is a 89 year old long term resident of this facility being seen for the management of her chronic illnesses:  Aortic atherosclerosis Thrombocytopenia:Chronic constipation. There are no reports of uncontrolled pain; no reports of anxiety or agitation.   Past Medical History:  Diagnosis Date   Allergic rhinitis 11/25/2016   Arthritis    mild, no known falls uses cane   Dementia ([place])    Hypertension    Neurofibroma 05/29/2017   Right breast skin lesion- pathology neurofibroma     Past Surgical History:  Procedure Laterality Date   MASS EXCISION Right 06/07/2017   Procedure: EXCISION SKIN LESION OF RIGHT BREAST;  Surgeon: Virl Cagey, MD;  Location: [facility];  Service: General;  Laterality: Right;    Social History   Socioeconomic History   Marital status: Widowed    Spouse name: Not on file   Number of children: 0   Years of education: Not on file   Highest education level: Not on file  Occupational History   Not on file  Tobacco Use   Smoking status: Never   Smokeless tobacco: Never  Vaping Use   Vaping Use: Never used  Substance and Sexual Activity   Alcohol use: No   Drug use: No   Sexual activity: Not Currently  Other Topics Concern   Not on file  Social History Narrative   Not on file   Social Determinants of Health   Financial Resource Strain: Low Risk  (03/31/2018)   Overall Financial Resource Strain (CARDIA)    Difficulty of Paying Living Expenses: Not hard at all  Food Insecurity: No Food Insecurity (03/31/2018)   Hunger Vital Sign    Worried About Running Out of Food in the Last Year: Never true    Ran Out of Food in the Last Year: Never true   Transportation Needs: No Transportation Needs (03/31/2018)   PRAPARE - Hydrologist (Medical): No    Lack of Transportation (Non-Medical): No  Physical Activity: Inactive (03/31/2018)   Exercise Vital Sign    Days of Exercise per Week: 0 days    Minutes of Exercise per Session: 0 min  Stress: No Stress Concern Present (03/31/2018)   [place]    Feeling of Stress : Not at all  Social Connections: Somewhat Isolated (03/31/2018)   Social Connection and Isolation Panel [NHANES]    Frequency of Communication with Friends and Family: More than three times a week    Frequency of Social Gatherings with Friends and Family: Once a week    Attends Religious Services: 1 to 4 times per year    Active Member of [employer] or Organizations: No    Attends Archivist Meetings: Never    Marital Status: Widowed  Intimate Partner Violence: Not At Risk (03/31/2018)   Humiliation, Afraid, Rape, and Kick questionnaire    Fear of Current or Ex-Partner: No    Emotionally Abused: No    Physically Abused: No    Sexually Abused: No   Family History  Problem Relation Age of Onset  Diabetes Sister    Hypertension Sister       VITAL SIGNS BP 135/68   Pulse 65   Temp 98.1 F (36.7 C)   Resp 20   Ht 5' 2"$  (1.575 m)   Wt 160 lb 8 oz (72.8 kg)   SpO2 98%   BMI 29.36 kg/m   Outpatient Encounter Medications as of 09/27/2022  Medication Sig   acetaminophen (TYLENOL) 325 MG tablet Take 650 mg by mouth every 6 (six) hours as needed.   albuterol (PROVENTIL) (2.5 MG/3ML) 0.083% nebulizer solution Take 2.5 mg by nebulization every 4 (four) hours as needed for wheezing or shortness of breath.   Amino Acids-Protein Hydrolys (FEEDING SUPPLEMENT, PRO-STAT SUGAR FREE 64,) LIQD Take 30 mLs by mouth 3 (three) times daily with meals.   ascorbic acid (VITAMIN C) 500 MG tablet Take 500 mg by mouth daily.   aspirin EC 81  MG tablet Take 1 tablet (81 mg total) by mouth daily.   ferrous sulfate 325 (65 FE) MG EC tablet Take 325 mg by mouth daily.   Magnesium Hydroxide (MILK OF MAGNESIA PO) Take 30 mLs by mouth once as needed.   NON FORMULARY Diet: Dysphagia 1 (puree) with thin liquids   polyethylene glycol (MIRALAX / GLYCOLAX) 17 g packet Take 17 g by mouth daily.   polyvinyl alcohol (LIQUIFILM TEARS) 1.4 % ophthalmic solution Place 1 drop into both eyes in the morning and at bedtime. Wait at least 5 minutes between multiple drops in same eye.   No facility-administered encounter medications on file as of 09/27/2022.     SIGNIFICANT DIAGNOSTIC EXAMS   PREVIOUS   05-02-22: right lower extremity doppler: acute DVT common femoral vein   NO NEW EXAMS    LABS REVIEWED:   12-08-21: glucose 79; bun 12; creat 0.79; k+ 3.6; na++ 141; ca 8.7; GFR>60; protein 5.7 albumin 2.9  04-30-22: wbc 5.3; hgb 10.0; hct 32.3; mcv 92.3 plt 201; glucose 96; bun 22; creat 0.87; k+ 3.8; na++ 139; ca 8.4; gfr 59; protein 5.6 albumin 2.7 05-03-22: iron 41; tibc 303; vitamin B 12: 238 06-05-22: wbc 3.2; hgb 12.2; hct 37.5; mcv 95.2 plt 82    TODAY  07-19-22: wbc 4.8; hgb 12.8; hct 41.1; mcv 94.3 plt 161 09-16-22: wbc 5.1; hgb 12.4; hct 39.6; mcv 97.5 plt 125; glucose 88; bun 22; creat 1.17; k+ 3.4; na++ 142; ca 7.9; gfr 41       Review of Systems  Unable to perform ROS: Dementia   Physical Exam Constitutional:      General: She is not in acute distress.    Appearance: She is well-developed. She is not diaphoretic.  Neck:     Thyroid: No thyromegaly.  Cardiovascular:     Rate and Rhythm: Normal rate and regular rhythm.     Pulses: Normal pulses.     Heart sounds: Normal heart sounds.  Pulmonary:     Effort: Pulmonary effort is normal. No respiratory distress.     Breath sounds: Normal breath sounds.  Abdominal:     General: Bowel sounds are normal. There is no distension.     Palpations: Abdomen is soft.     Tenderness:  There is no abdominal tenderness.  Musculoskeletal:        General: Normal range of motion.     Cervical back: Neck supple.     Right lower leg: No edema.     Left lower leg: No edema.  Lymphadenopathy:     Cervical: No  cervical adenopathy.  Skin:    General: Skin is warm and dry.  Neurological:     Mental Status: She is alert. Mental status is at baseline.  Psychiatric:        Mood and Affect: Mood normal.       ASSESSMENT/ PLAN:  TODAY  Aortic atherosclerosis (ct 11-16-17) not on statin due to advanced age  67. Thrombocytopenia: plt 125 will monitor  3. Chronic constipation: will continue miralax daily   PREVIOUS   4. Hyperlipidemia LDL goal <130: is off lipitor due to her advanced age.  5. Chronic kidney disease stage 3a; bun 22; creat 1.17; gfr 41    6. Chronic respiratory failure with hypoxia will monitor  7. Severe late onset alzheimer's disease without behavioral disturbance; psychotic disturbance; mood disturbance or anxiety: weight is 160 pounds; will monitor  8. Essential hypertension: b/p 135/68  9. Severe protein calorie malnutrition: protein 5.6 albumin 2.7; is on prostat three times daily   10. Anemia due to CKD stage 3: hgb 12.4; will stop iron      Ok Edwards NP [facility]  call [phone]

## 2022-10-18 DIAGNOSIS — G301 Alzheimer's disease with late onset: Secondary | ICD-10-CM | POA: Diagnosis not present

## 2022-10-18 DIAGNOSIS — Z515 Encounter for palliative care: Secondary | ICD-10-CM | POA: Diagnosis not present

## 2022-10-26 ENCOUNTER — Encounter: Payer: Self-pay | Admitting: Adult Health

## 2022-10-26 ENCOUNTER — Non-Acute Institutional Stay (SKILLED_NURSING_FACILITY): Payer: Medicare Other | Admitting: Adult Health

## 2022-10-26 DIAGNOSIS — J9611 Chronic respiratory failure with hypoxia: Secondary | ICD-10-CM | POA: Diagnosis not present

## 2022-10-26 DIAGNOSIS — G301 Alzheimer's disease with late onset: Secondary | ICD-10-CM | POA: Diagnosis not present

## 2022-10-26 DIAGNOSIS — I700 Atherosclerosis of aorta: Secondary | ICD-10-CM

## 2022-10-26 DIAGNOSIS — F02C0 Dementia in other diseases classified elsewhere, severe, without behavioral disturbance, psychotic disturbance, mood disturbance, and anxiety: Secondary | ICD-10-CM

## 2022-10-26 NOTE — Progress Notes (Signed)
Location:  [facility] Room Number: 149D Place of Service:  SNF (31)   CODE STATUS: DNR   No Known Allergies  Chief Complaint  Patient presents with   Acute Visit    Patient is being seen for Acute Care Plan meeting     HPI:  We have come together for her care plan meeting. Family present  BIMS 5/15 mood 0/30. Is non ambulatory with no falls. She requires moderate to dependent assist with her adls. She is incontinent of bladder and bowel. Dietary:  weight is 158 pounds; puree diet; appetite 50-100%. Therapy: none at this time. Activities: religious activities. She will continue to be followed for her chronic illnesses including: Aortic atherosclerosis Chronic respiratory failure with hypoxia Severe late onset alzheimer's disease without behavioral disturbance; psychotic disturbance; mood disturbance or anxiety  Past Medical History:  Diagnosis Date   Allergic rhinitis 11/25/2016   Arthritis    mild, no known falls uses cane   Dementia ([place])    Hypertension    Neurofibroma 05/29/2017   Right breast skin lesion- pathology neurofibroma     Past Surgical History:  Procedure Laterality Date   MASS EXCISION Right 06/07/2017   Procedure: EXCISION SKIN LESION OF RIGHT BREAST;  Surgeon: Virl Cagey, MD;  Location: [facility];  Service: General;  Laterality: Right;    Social History   Socioeconomic History   Marital status: Widowed    Spouse name: Not on file   Number of children: 0   Years of education: Not on file   Highest education level: Not on file  Occupational History   Not on file  Tobacco Use   Smoking status: Never   Smokeless tobacco: Never  Vaping Use   Vaping Use: Never used  Substance and Sexual Activity   Alcohol use: No   Drug use: No   Sexual activity: Not Currently  Other Topics Concern   Not on file  Social History Narrative   Not on file   Social Determinants of Health   Financial Resource Strain: Low Risk  (03/31/2018)    Overall Financial Resource Strain (CARDIA)    Difficulty of Paying Living Expenses: Not hard at all  Food Insecurity: No Food Insecurity (03/31/2018)   Hunger Vital Sign    Worried About Running Out of Food in the Last Year: Never true    Ran Out of Food in the Last Year: Never true  Transportation Needs: No Transportation Needs (03/31/2018)   PRAPARE - Hydrologist (Medical): No    Lack of Transportation (Non-Medical): No  Physical Activity: Inactive (03/31/2018)   Exercise Vital Sign    Days of Exercise per Week: 0 days    Minutes of Exercise per Session: 0 min  Stress: No Stress Concern Present (03/31/2018)   [place]    Feeling of Stress : Not at all  Social Connections: Somewhat Isolated (03/31/2018)   Social Connection and Isolation Panel [NHANES]    Frequency of Communication with Friends and Family: More than three times a week    Frequency of Social Gatherings with Friends and Family: Once a week    Attends Religious Services: 1 to 4 times per year    Active Member of [employer] or Organizations: No    Attends Archivist Meetings: Never    Marital Status: Widowed  Intimate Partner Violence: Not At Risk (03/31/2018)   Humiliation, Afraid, Rape, and Kick  questionnaire    Fear of Current or Ex-Partner: No    Emotionally Abused: No    Physically Abused: No    Sexually Abused: No   Family History  Problem Relation Age of Onset   Diabetes Sister    Hypertension Sister       VITAL SIGNS BP (!) 148/74   Pulse 80   Temp (!) 97.5 F (36.4 C) (Temporal)   Resp 20   Ht '5\' 2"'$  (1.575 m)   Wt 158 lb 6.4 oz (71.8 kg)   SpO2 90%   BMI 28.97 kg/m   Outpatient Encounter Medications as of 10/26/2022  Medication Sig   acetaminophen (TYLENOL) 325 MG tablet Take 650 mg by mouth every 6 (six) hours as needed.   albuterol (PROVENTIL) (2.5 MG/3ML) 0.083% nebulizer solution Take 2.5 mg  by nebulization every 4 (four) hours as needed for wheezing or shortness of breath.   Amino Acids-Protein Hydrolys (FEEDING SUPPLEMENT, PRO-STAT SUGAR FREE 64,) LIQD Take 30 mLs by mouth 3 (three) times daily with meals.   ascorbic acid (VITAMIN C) 500 MG tablet Take 500 mg by mouth daily.   aspirin EC 325 MG tablet Take 325 mg by mouth daily.   ferrous sulfate 325 (65 FE) MG EC tablet Take 325 mg by mouth daily.   NON FORMULARY Diet: Dysphagia 1 (puree) with thin liquids   polyethylene glycol (MIRALAX / GLYCOLAX) 17 g packet Take 17 g by mouth daily.   polyvinyl alcohol (LIQUIFILM TEARS) 1.4 % ophthalmic solution Place 1 drop into both eyes in the morning and at bedtime. Wait at least 5 minutes between multiple drops in same eye.   aspirin EC 81 MG tablet Take 1 tablet (81 mg total) by mouth daily. (Patient not taking: Reported on 10/26/2022)   Magnesium Hydroxide (MILK OF MAGNESIA PO) Take 30 mLs by mouth once as needed. (Patient not taking: Reported on 10/26/2022)   No facility-administered encounter medications on file as of 10/26/2022.     SIGNIFICANT DIAGNOSTIC EXAMS  PREVIOUS   05-02-22: right lower extremity doppler: acute DVT common femoral vein   NO NEW EXAMS    LABS REVIEWED:   12-08-21: glucose 79; bun 12; creat 0.79; k+ 3.6; na++ 141; ca 8.7; GFR>60; protein 5.7 albumin 2.9  04-30-22: wbc 5.3; hgb 10.0; hct 32.3; mcv 92.3 plt 201; glucose 96; bun 22; creat 0.87; k+ 3.8; na++ 139; ca 8.4; gfr 59; protein 5.6 albumin 2.7 05-03-22: iron 41; tibc 303; vitamin B 12: 238 06-05-22: wbc 3.2; hgb 12.2; hct 37.5; mcv 95.2 plt 82   07-19-22: wbc 4.8; hgb 12.8; hct 41.1; mcv 94.3 plt 161 09-16-22: wbc 5.1; hgb 12.4; hct 39.6; mcv 97.5 plt 125; glucose 88; bun 22; creat 1.17; k+ 3.4; na++ 142; ca 7.9; gfr 41  NO NEW LABS.   Review of Systems  Unable to perform ROS: Dementia   Physical Exam Constitutional:      General: She is not in acute distress.    Appearance: She is well-developed.  She is not diaphoretic.  Neck:     Thyroid: No thyromegaly.  Cardiovascular:     Rate and Rhythm: Normal rate and regular rhythm.     Pulses: Normal pulses.     Heart sounds: Normal heart sounds.  Pulmonary:     Effort: Pulmonary effort is normal. No respiratory distress.     Breath sounds: Normal breath sounds.  Abdominal:     General: Bowel sounds are normal. There is no distension.  Palpations: Abdomen is soft.     Tenderness: There is no abdominal tenderness.  Musculoskeletal:        General: Normal range of motion.     Cervical back: Neck supple.     Right lower leg: No edema.     Left lower leg: No edema.  Lymphadenopathy:     Cervical: No cervical adenopathy.  Skin:    General: Skin is warm and dry.  Neurological:     Mental Status: She is alert. Mental status is at baseline.  Psychiatric:        Mood and Affect: Mood normal.     ASSESSMENT/ PLAN:  TODAY  Aortic atherosclerosis Chronic respiratory failure with hypoxia Severe late onset alzheimer's disease without behavioral disturbance; psychotic disturbance; mood disturbance or anxiety  Will continue current medications Will continue current plan of care Will continue to monitor her status.   Time spent with patient: 40 minutes: medications; activities; dietary    Ok Edwards NP [facility]   call [phone]

## 2022-11-05 ENCOUNTER — Encounter: Payer: Self-pay | Admitting: Adult Health

## 2022-11-05 ENCOUNTER — Non-Acute Institutional Stay (SKILLED_NURSING_FACILITY): Payer: Medicare Other | Admitting: Adult Health

## 2022-11-05 DIAGNOSIS — E785 Hyperlipidemia, unspecified: Secondary | ICD-10-CM | POA: Diagnosis not present

## 2022-11-05 DIAGNOSIS — J9611 Chronic respiratory failure with hypoxia: Secondary | ICD-10-CM | POA: Diagnosis not present

## 2022-11-05 DIAGNOSIS — N1831 Chronic kidney disease, stage 3a: Secondary | ICD-10-CM

## 2022-11-05 NOTE — Progress Notes (Unsigned)
Location:  [place] Room Number: 149D Place of Service:  SNF (31)   CODE STATUS: DNR No Known Allergies  Chief Complaint  Patient presents with   Medical Management of Chronic Issues                Hyperlipidemia: LDL goal <130: Chronic kidney disease stage 3a:  Chronic respiratory failure with hypoxia:     HPI:  She is a 89 year old long term resident of this facility being seen for the management of her chronic illnesses:   Hyperlipidemia: LDL goal <130: Chronic kidney disease stage 3a:  Chronic respiratory failure with hypoxia. There are no reports of uncontrolled pain. There are no reports of changes in appetite. There are no reports of anxiety present.   Past Medical History:  Diagnosis Date   Allergic rhinitis 11/25/2016   Arthritis    mild, no known falls uses cane   Dementia ([place])    Hypertension    Neurofibroma 05/29/2017   Right breast skin lesion- pathology neurofibroma     Past Surgical History:  Procedure Laterality Date   MASS EXCISION Right 06/07/2017   Procedure: EXCISION SKIN LESION OF RIGHT BREAST;  Surgeon: Virl Cagey, MD;  Location: [facility];  Service: General;  Laterality: Right;    Social History   Socioeconomic History   Marital status: Widowed    Spouse name: Not on file   Number of children: 0   Years of education: Not on file   Highest education level: Not on file  Occupational History   Not on file  Tobacco Use   Smoking status: Never   Smokeless tobacco: Never  Vaping Use   Vaping Use: Never used  Substance and Sexual Activity   Alcohol use: No   Drug use: No   Sexual activity: Not Currently  Other Topics Concern   Not on file  Social History Narrative   Not on file   Social Determinants of Health   Financial Resource Strain: Low Risk  (03/31/2018)   Overall Financial Resource Strain (CARDIA)    Difficulty of Paying Living Expenses: Not hard at all  Food Insecurity: No Food Insecurity  (03/31/2018)   Hunger Vital Sign    Worried About Running Out of Food in the Last Year: Never true    Ran Out of Food in the Last Year: Never true  Transportation Needs: No Transportation Needs (03/31/2018)   PRAPARE - Hydrologist (Medical): No    Lack of Transportation (Non-Medical): No  Physical Activity: Inactive (03/31/2018)   Exercise Vital Sign    Days of Exercise per Week: 0 days    Minutes of Exercise per Session: 0 min  Stress: No Stress Concern Present (03/31/2018)   [place]    Feeling of Stress : Not at all  Social Connections: Somewhat Isolated (03/31/2018)   Social Connection and Isolation Panel [NHANES]    Frequency of Communication with Friends and Family: More than three times a week    Frequency of Social Gatherings with Friends and Family: Once a week    Attends Religious Services: 1 to 4 times per year    Active Member of [employer] or Organizations: No    Attends Archivist Meetings: Never    Marital Status: Widowed  Intimate Partner Violence: Not At Risk (03/31/2018)   Humiliation, Afraid, Rape, and Kick questionnaire    Fear of Current or  Ex-Partner: No    Emotionally Abused: No    Physically Abused: No    Sexually Abused: No   Family History  Problem Relation Age of Onset   Diabetes Sister    Hypertension Sister       VITAL SIGNS BP 136/80   Pulse (!) 56   Temp 97.6 F (36.4 C)   Resp 20   Ht 5\' 2"  (1.575 m)   Wt 158 lb 4 oz (71.8 kg)   SpO2 100%   BMI 28.94 kg/m   Outpatient Encounter Medications as of 11/05/2022  Medication Sig   acetaminophen (TYLENOL) 325 MG tablet Take 650 mg by mouth every 6 (six) hours as needed.   albuterol (PROVENTIL) (2.5 MG/3ML) 0.083% nebulizer solution Take 2.5 mg by nebulization every 4 (four) hours as needed for wheezing or shortness of breath.   Amino Acids-Protein Hydrolys (FEEDING SUPPLEMENT, PRO-STAT SUGAR FREE  64,) LIQD Take 30 mLs by mouth 3 (three) times daily with meals.   ascorbic acid (VITAMIN C) 500 MG tablet Take 500 mg by mouth daily.   aspirin EC 325 MG tablet Take 325 mg by mouth daily.   NON FORMULARY Diet: Dysphagia 1 (puree) with thin liquids   polyethylene glycol (MIRALAX / GLYCOLAX) 17 g packet Take 17 g by mouth daily.   polyvinyl alcohol (LIQUIFILM TEARS) 1.4 % ophthalmic solution Place 1 drop into both eyes in the morning and at bedtime. Wait at least 5 minutes between multiple drops in same eye.   [DISCONTINUED] aspirin EC 81 MG tablet Take 1 tablet (81 mg total) by mouth daily. (Patient not taking: Reported on 10/26/2022)   [DISCONTINUED] ferrous sulfate 325 (65 FE) MG EC tablet Take 325 mg by mouth daily.   [DISCONTINUED] Magnesium Hydroxide (MILK OF MAGNESIA PO) Take 30 mLs by mouth once as needed. (Patient not taking: Reported on 10/26/2022)   No facility-administered encounter medications on file as of 11/05/2022.     SIGNIFICANT DIAGNOSTIC EXAMS  PREVIOUS   05-02-22: right lower extremity doppler: acute DVT common femoral vein   NO NEW EXAMS    LABS REVIEWED:   12-08-21: glucose 79; bun 12; creat 0.79; k+ 3.6; na++ 141; ca 8.7; GFR>60; protein 5.7 albumin 2.9  04-30-22: wbc 5.3; hgb 10.0; hct 32.3; mcv 92.3 plt 201; glucose 96; bun 22; creat 0.87; k+ 3.8; na++ 139; ca 8.4; gfr 59; protein 5.6 albumin 2.7 05-03-22: iron 41; tibc 303; vitamin B 12: 238 06-05-22: wbc 3.2; hgb 12.2; hct 37.5; mcv 95.2 plt 82   07-19-22: wbc 4.8; hgb 12.8; hct 41.1; mcv 94.3 plt 161 09-16-22: wbc 5.1; hgb 12.4; hct 39.6; mcv 97.5 plt 125; glucose 88; bun 22; creat 1.17; k+ 3.4; na++ 142; ca 7.9; gfr 41  NO NEW LABS.       Review of Systems  Unable to perform ROS: Dementia   Physical Exam Constitutional:      General: She is not in acute distress.    Appearance: She is well-developed. She is not diaphoretic.  Neck:     Thyroid: No thyromegaly.  Cardiovascular:     Rate and Rhythm:  Regular rhythm. Bradycardia present.     Heart sounds: Normal heart sounds.  Pulmonary:     Effort: Pulmonary effort is normal. No respiratory distress.     Breath sounds: Normal breath sounds.  Abdominal:     General: Bowel sounds are normal. There is no distension.     Palpations: Abdomen is soft.     Tenderness:  There is no abdominal tenderness.  Musculoskeletal:        General: Normal range of motion.     Cervical back: Neck supple.     Right lower leg: No edema.     Left lower leg: No edema.  Lymphadenopathy:     Cervical: No cervical adenopathy.  Skin:    General: Skin is warm and dry.  Neurological:     Mental Status: She is alert. Mental status is at baseline.  Psychiatric:        Mood and Affect: Mood normal.    ASSESSMENT/ PLAN:  TODAY  Hyperlipidemia: LDL goal <130: is off lipitor due to advanced age  48. Chronic kidney disease stage 3a: bun 22; creat 1.17; gfr 41  3. Chronic respiratory failure with hypoxia: will monitor    PREVIOUS   4. Severe late onset alzheimer's disease without behavioral disturbance; psychotic disturbance; mood disturbance or anxiety: weight is 16 pounds; will monitor  5. Essential hypertension: b/p 136/80  6. Severe protein calorie malnutrition: protein 5.6 albumin 2.7; is on prostat three times daily   7. Anemia due to CKD stage 3: hgb 12.4; is off iron   8. Aortic atherosclerosis (ct 11-16-17) not on statin due to advanced age  68. Thrombocytopenia: plt 125 will monitor  10. Chronic constipation: will continue miralax daily    Ok Edwards NP [facility]   call [phone]

## 2022-11-07 ENCOUNTER — Encounter: Payer: Self-pay | Admitting: Adult Health

## 2022-11-07 NOTE — Progress Notes (Signed)
This encounter was created in error - please disregard.

## 2022-11-08 ENCOUNTER — Other Ambulatory Visit (HOSPITAL_COMMUNITY)
Admission: RE | Admit: 2022-11-08 | Discharge: 2022-11-08 | Disposition: A | Discharge status: Home / Self Care | Payer: Medicare Other | Source: Skilled Nursing Facility | Attending: Adult Health | Admitting: Adult Health

## 2022-11-08 DIAGNOSIS — E538 Deficiency of other specified B group vitamins: Secondary | ICD-10-CM | POA: Diagnosis not present

## 2022-11-08 LAB — VITAMIN B12: Vitamin B-12: 256 pg/mL (ref 180–914)

## 2022-11-22 DIAGNOSIS — Z515 Encounter for palliative care: Secondary | ICD-10-CM | POA: Diagnosis not present

## 2022-11-22 DIAGNOSIS — G301 Alzheimer's disease with late onset: Secondary | ICD-10-CM | POA: Diagnosis not present

## 2022-12-03 ENCOUNTER — Non-Acute Institutional Stay (SKILLED_NURSING_FACILITY): Payer: Medicare Other | Admitting: Internal Medicine

## 2022-12-03 ENCOUNTER — Encounter: Payer: Self-pay | Admitting: Internal Medicine

## 2022-12-03 DIAGNOSIS — G301 Alzheimer's disease with late onset: Secondary | ICD-10-CM

## 2022-12-03 DIAGNOSIS — I700 Atherosclerosis of aorta: Secondary | ICD-10-CM

## 2022-12-03 DIAGNOSIS — D696 Thrombocytopenia, unspecified: Secondary | ICD-10-CM

## 2022-12-03 DIAGNOSIS — F02C11 Dementia in other diseases classified elsewhere, severe, with agitation: Secondary | ICD-10-CM | POA: Diagnosis not present

## 2022-12-03 DIAGNOSIS — N1832 Chronic kidney disease, stage 3b: Secondary | ICD-10-CM

## 2022-12-03 NOTE — Assessment & Plan Note (Signed)
She denies any anginal equivalent.

## 2022-12-03 NOTE — Assessment & Plan Note (Signed)
Staff reports that she is becoming more agitated and angry and noncompliant.  Meal intake is averaging approximately 25%.  She can provide no meaningful history to me.  She could give me her year of birth but not her age. Discussed trial of low-dose SSRI with NP.

## 2022-12-03 NOTE — Assessment & Plan Note (Signed)
09/16/2022 platelet count 125,000; prior values had ranged from a low of 82,000 up to 161,000.  No bleeding dyscrasias reported at the SNF; continue to monitor.

## 2022-12-03 NOTE — Assessment & Plan Note (Signed)
On 09/16/2022 creatinine was 1.17 with a GFR 41 indicating stage IIIb CKD.  She had previously exhibited high stage IIIa or stage II CKD.  Med list reviewed; no change in meds or dosages indicated.

## 2022-12-03 NOTE — Patient Instructions (Signed)
See assessment and plan under each diagnosis in the problem list and acutely for this visit 

## 2022-12-03 NOTE — Progress Notes (Signed)
NURSING HOME LOCATION:  [facility] ROOM NUMBER:  [id number]  CODE STATUS:  DNR  PCP:  Synthia Innocent NP  This is a nursing facility follow up visit of chronic medical diagnoses & to document compliance with Regulation 483.30 (c) in The Long Term Care Survey Manual Phase 2 which mandates caregiver visit ( visits can alternate among physician, PA or NP as per statutes) within 10 days of 30 days / 60 days/ 90 days post admission to SNF date    Interim medical record and care since last SNF visit was updated with review of diagnostic studies and change in clinical status since last visit were documented.  HPI: She is a permanent resident of facility with medical diagnoses of extrinsic rhinitis, essential hypertension, history of neurofibroma, aortic atherosclerosis, history of DVT, and late onset Alzheimer's dementia. On 09/16/2022 creatinine was 1.17 with a GFR of 41; prior values were 0.87 and 59 four months prior.  Thrombocytopenia was present with a platelet count of 125,000; prior range had been as low as 82,000 with peak of 161,000.  CBC was otherwise normal.  Review of systems: Dementia invalidated responses.  She confabulated that her niece had just brought her meal tray when it was a SNF staff member.  All entre items were pured.  I attempted to ask her if she were having any dysphagia symptoms.  I asked her if food would "hang up or stick" when she would eat; she did not comprehend this concept.  There also may be an issue with her auditory acuity as she asked me "is it sick?" At this point , I told her I would return after she had finished lunch. Staff states that she is becoming more agitated and angry on occasion.  At times she will not eat her meal; apparently she only ate 25% of today's lunch.Last week she had gone to the activities program but refused to leave the room at the end of the event. When I returned to interview her and examine her, she was confused about  having seen another doctor earlier. She could give me the year of her birth (1919),but she could not tell me how old she was.  She then went on to confabulate about [place] her father's property which has been of such health benefit to her, her family, and the community members.  Constitutional: No fever, significant weight change, fatigue  Eyes: No redness, discharge, pain, vision change ENT/mouth: No nasal congestion,  purulent discharge, earache, change in hearing, sore throat  Cardiovascular: No chest pain, palpitations, paroxysmal nocturnal dyspnea, claudication, edema  Respiratory: No cough, sputum production, hemoptysis, DOE, significant snoring, apnea   Gastrointestinal: No heartburn, dysphagia, abdominal pain, nausea /vomiting, rectal bleeding, melena, change in bowels Genitourinary: No dysuria, hematuria, pyuria, incontinence, nocturia Musculoskeletal: No joint stiffness, joint swelling, weakness, pain Dermatologic: No rash, pruritus, change in appearance of skin Neurologic: No dizziness, headache, syncope, seizures, numbness, tingling Psychiatric: No significant anxiety, depression, insomnia, anorexia Endocrine: No change in hair/skin/nails, excessive thirst, excessive hunger, excessive urination  Hematologic/lymphatic: No significant bruising, lymphadenopathy, abnormal bleeding Allergy/immunology: No itchy/watery eyes, significant sneezing, urticaria, angioedema  Physical exam:  Pertinent or positive findings: She is hard of hearing.  She is wearing a wig.  She appears decades younger than her stated age.  There is slight asymmetry of the nasolabial folds with the right minimally decreased.  Dentition is amazingly good.  She has low-grade generalized musical expiratory rhonchi.  Grade 1/2 systolic  murmur is present at the right base.  There is slight accentuation of S2.  Abdomen is protuberant.  Pedal pulses are decreased.  She has trace edema at the sock line.  She is  amazingly strong to opposition in all extremities.  General appearance: Adequately nourished; no acute distress, increased work of breathing is present.   Lymphatic: No lymphadenopathy about the head, neck, axilla. Eyes: No conjunctival inflammation or lid edema is present. There is no scleral icterus. Ears:  External ear exam shows no significant lesions or deformities.   Nose:  External nasal examination shows no deformity or inflammation. Nasal mucosa are pink and moist without lesions, exudates Oral exam:  Lips and gums are healthy appearing. There is no oropharyngeal erythema or exudate. Neck:  No thyromegaly, masses, tenderness noted.    Heart:  Normal rate and regular rhythm. S1 normal without gallop, click, rub .  Lungs:  without wheezes, rales, rubs. Abdomen: Bowel sounds are normal. Abdomen is soft and nontender with no organomegaly, hernias, masses. GU: Deferred  Extremities:  No cyanosis, clubbing  Neurologic exam :Balance, Rhomberg, finger to nose testing could not be completed due to clinical state Skin: Warm & dry w/o tenting. No significant lesions or rash.  See summary under each active problem in the Problem List with associated updated therapeutic plan

## 2022-12-10 DIAGNOSIS — I739 Peripheral vascular disease, unspecified: Secondary | ICD-10-CM | POA: Diagnosis not present

## 2022-12-10 DIAGNOSIS — M2041 Other hammer toe(s) (acquired), right foot: Secondary | ICD-10-CM | POA: Diagnosis not present

## 2022-12-10 DIAGNOSIS — B351 Tinea unguium: Secondary | ICD-10-CM | POA: Diagnosis not present

## 2022-12-10 DIAGNOSIS — M2042 Other hammer toe(s) (acquired), left foot: Secondary | ICD-10-CM | POA: Diagnosis not present

## 2023-01-07 ENCOUNTER — Encounter: Payer: Self-pay | Admitting: Adult Health

## 2023-01-07 ENCOUNTER — Non-Acute Institutional Stay (SKILLED_NURSING_FACILITY): Payer: Medicare Other | Admitting: Adult Health

## 2023-01-07 DIAGNOSIS — Z0000 Encounter for general adult medical examination without abnormal findings: Secondary | ICD-10-CM

## 2023-01-07 NOTE — Patient Instructions (Signed)
   Katrina Berry , Thank you for taking time to come for your Medicare Wellness Visit. I appreciate your ongoing commitment to your health goals. Please review the following plan we discussed and let me know if I can assist you in the future.   These are the goals we discussed:  Goals      Absence of Fall and Fall-Related Injury     Evidence-based guidance:  Assess fall risk using a validated tool when available. Consider balance and gait impairment, muscle weakness, diminished vision or hearing, environmental hazards, presence of urinary or bowel urgency and/or incontinence.  Communicate fall injury risk to interprofessional healthcare team.  Develop a fall prevention plan with the patient and family.  Promote use of personal vision and auditory aids.  Promote reorientation, appropriate sensory stimulation, and routines to decrease risk of fall when changes in mental status are present.  Assess assistance level required for safe and effective self-care; consider referral for home care.  Encourage physical activity, such as performance of self-care at highest level of ability, strength and balance exercise program, and provision of appropriate assistive devices; refer to rehabilitation therapy.  Refer to community-based fall prevention program where available.  If fall occurs, determine the cause and revise fall injury prevention plan.  Regularly review medication contribution to fall risk; consider risk related to polypharmacy and age.  Refer to pharmacist for consultation when concerns about medications are revealed.  Balance adequate pain management with potential for oversedation.  Provide guidance related to environmental modifications.  Consider supplementation with Vitamin D.   Notes:      DIET - INCREASE WATER INTAKE     General - Client will not be readmitted within 30 days (C-SNP)        This is a list of the screening recommended for you and due dates:  Health Maintenance   Topic Date Due   Flu Shot  03/21/2023   Medicare Annual Wellness Visit  01/07/2024   DTaP/Tdap/Td vaccine (2 - Td or Tdap) 07/06/2025   Pneumonia Vaccine  Completed   Zoster (Shingles) Vaccine  Completed   HPV Vaccine  Aged Out   DEXA scan (bone density measurement)  Discontinued   COVID-19 Vaccine  Discontinued

## 2023-01-07 NOTE — Progress Notes (Signed)
Subjective:   Katrina Berry is a 89 y.o. female who presents for Medicare Annual (Subsequent) preventive examination.  Review of Systems    Review of Systems  Unable to perform ROS: Dementia    Cardiac Risk Factors include: advanced age (>89men, >51 women);sedentary lifestyle     Objective:    Today's Vitals   01/07/23 1050  BP: 129/68  Pulse: 62  Resp: 20  Temp: 97.9 F (36.6 C)  SpO2: 93%  Weight: 156 lb 6.4 oz (70.9 kg)  Height: 5\' 2"  (1.575 m)   Body mass index is 28.61 kg/m.     11/05/2022   10:03 AM 10/26/2022    9:34 AM 09/27/2022    9:23 AM 08/17/2022   12:32 PM 05/04/2022    3:33 PM 05/03/2022   10:17 AM 03/27/2022   10:36 AM  Advanced Directives  Does Patient Have a Medical Advance Directive? Yes Yes Yes Yes Yes Yes Yes  Type of Advance Directive Out of facility DNR (pink MOST or yellow form) Out of facility DNR (pink MOST or yellow form) Out of facility DNR (pink MOST or yellow form) Out of facility DNR (pink MOST or yellow form) Out of facility DNR (pink MOST or yellow form) Out of facility DNR (pink MOST or yellow form) Out of facility DNR (pink MOST or yellow form)  Does patient want to make changes to medical advance directive? No - Patient declined No - Patient declined No - Patient declined No - Patient declined No - Patient declined No - Patient declined No - Patient declined  Pre-existing out of facility DNR order (yellow form or pink MOST form) Pink MOST form placed in chart (order not valid for inpatient use);Yellow form placed in chart (order not valid for inpatient use) Pink MOST form placed in chart (order not valid for inpatient use);Yellow form placed in chart (order not valid for inpatient use) Pink MOST form placed in chart (order not valid for inpatient use);Yellow form placed in chart (order not valid for inpatient use) Pink MOST/Yellow Form most recent copy in chart - Physician notified to receive inpatient order Yellow form placed in chart  (order not valid for inpatient use)  Yellow form placed in chart (order not valid for inpatient use)    Current Medications (verified) Outpatient Encounter Medications as of 01/07/2023  Medication Sig   acetaminophen (TYLENOL) 325 MG tablet Take 650 mg by mouth every 6 (six) hours as needed.   albuterol (PROVENTIL) (2.5 MG/3ML) 0.083% nebulizer solution Take 2.5 mg by nebulization every 4 (four) hours as needed for wheezing or shortness of breath.   Amino Acids-Protein Hydrolys (FEEDING SUPPLEMENT, PRO-STAT SUGAR FREE 64,) LIQD Take 30 mLs by mouth 3 (three) times daily with meals.   ascorbic acid (VITAMIN C) 500 MG tablet Take 500 mg by mouth daily.   aspirin EC 325 MG tablet Take 325 mg by mouth daily.   NON FORMULARY Diet: Dysphagia 1 (puree) with thin liquids   polyethylene glycol (MIRALAX / GLYCOLAX) 17 g packet Take 17 g by mouth daily.   polyvinyl alcohol (LIQUIFILM TEARS) 1.4 % ophthalmic solution Place 1 drop into both eyes in the morning and at bedtime. Wait at least 5 minutes between multiple drops in same eye.   No facility-administered encounter medications on file as of 01/07/2023.    Allergies (verified) Patient has no known allergies.   History: Past Medical History:  Diagnosis Date   Allergic rhinitis 11/25/2016   Arthritis    mild,  no known falls uses cane   Dementia (HCC)    Hypertension    Neurofibroma 05/29/2017   Right breast skin lesion- pathology neurofibroma    Past Surgical History:  Procedure Laterality Date   MASS EXCISION Right 06/07/2017   Procedure: EXCISION SKIN LESION OF RIGHT BREAST;  Surgeon: Lucretia Roers, MD;  Location: [facility];  Service: General;  Laterality: Right;   Family History  Problem Relation Age of Onset   Diabetes Sister    Hypertension Sister    Social History   Socioeconomic History   Marital status: Widowed    Spouse name: Not on file   Number of children: 0   Years of education: Not on file   Highest education level:  Not on file  Occupational History   Not on file  Tobacco Use   Smoking status: Never   Smokeless tobacco: Never  Vaping Use   Vaping Use: Never used  Substance and Sexual Activity   Alcohol use: No   Drug use: No   Sexual activity: Not Currently  Other Topics Concern   Not on file  Social History Narrative   Not on file   Social Determinants of Health   Financial Resource Strain: Low Risk  (03/31/2018)   Overall Financial Resource Strain (CARDIA)    Difficulty of Paying Living Expenses: Not hard at all  Food Insecurity: No Food Insecurity (03/31/2018)   Hunger Vital Sign    Worried About Running Out of Food in the Last Year: Never true    Ran Out of Food in the Last Year: Never true  Transportation Needs: No Transportation Needs (03/31/2018)   PRAPARE - Administrator, Civil Service (Medical): No    Lack of Transportation (Non-Medical): No  Physical Activity: Inactive (03/31/2018)   Exercise Vital Sign    Days of Exercise per Week: 0 days    Minutes of Exercise per Session: 0 min  Stress: No Stress Concern Present (03/31/2018)   [employer] of Occupational Health - Occupational Stress Questionnaire    Feeling of Stress : Not at all  Social Connections: Somewhat Isolated (03/31/2018)   Social Connection and Isolation Panel [NHANES]    Frequency of Communication with Friends and Family: More than three times a week    Frequency of Social Gatherings with Friends and Family: Once a week    Attends Religious Services: 1 to 4 times per year    Active Member of [employer] or Organizations: No    Attends Banker Meetings: Never    Marital Status: Widowed    Tobacco Counseling Counseling given: Not Answered   Clinical Intake:  Pre-visit preparation completed: Yes  Pain : No/denies pain     BMI - recorded: 28.61 Nutritional Status: BMI 25 -29 Overweight Nutritional Risks: Unintentional weight loss Diabetes: No  How often do you need to have  someone help you when you read instructions, pamphlets, or other written materials from your doctor or pharmacy?: 5 - Always  Diabetic?no  Interpreter Needed?: No  Comments: long term resident of [facility]   Activities of Daily Living    01/07/2023   10:56 AM  In your present state of health, do you have any difficulty performing the following activities:  Hearing? 1  Vision? 0  Difficulty concentrating or making decisions? 1  Walking or climbing stairs? 1  Dressing or bathing? 1  Doing errands, shopping? 1  Preparing Food and eating ? Y  Using the Toilet? Y  In  the past six months, have you accidently leaked urine? Y  Do you have problems with loss of bowel control? Y  Managing your Medications? Y  Managing your Finances? Y  Housekeeping or managing your Housekeeping? Y    Patient Care Team: Sharee Holster, NP as PCP - [facility] ([facility]iatric [facility]  Indicate any recent Medical Services you may have received from other than [facility] providers in the past year (date may be approximate).     Assessment:   This is a routine wellness examination for Allyn.  Hearing/Vision screen No results found.  Dietary issues and exercise activities discussed: Current Exercise Habits: The patient does not participate in regular exercise at present, Exercise limited by: None identified   Goals Addressed             This Visit's Progress    Absence of Fall and Fall-Related Injury   On track    Evidence-based guidance:  Assess fall risk using a validated tool when available. Consider balance and gait impairment, muscle weakness, diminished vision or hearing, environmental hazards, presence of urinary or bowel urgency and/or incontinence.  Communicate fall injury risk to interprofessional healthcare team.  Develop a fall prevention plan with the patient and family.  Promote use of personal vision and auditory aids.  Promote reorientation,  appropriate sensory stimulation, and routines to decrease risk of fall when changes in mental status are present.  Assess assistance level required for safe and effective self-care; consider referral for home care.  Encourage physical activity, such as performance of self-care at highest level of ability, strength and balance exercise program, and provision of appropriate assistive devices; refer to rehabilitation therapy.  Refer to community-based fall prevention program where available.  If fall occurs, determine the cause and revise fall injury prevention plan.  Regularly review medication contribution to fall risk; consider risk related to polypharmacy and age.  Refer to pharmacist for consultation when concerns about medications are revealed.  Balance adequate pain management with potential for oversedation.  Provide guidance related to environmental modifications.  Consider supplementation with Vitamin D.   Notes:      DIET - INCREASE WATER INTAKE   Not on track    General - Client will not be readmitted within 30 days (C-SNP)   On track      Depression Screen    01/07/2023   10:55 AM 11/05/2022   10:02 AM 10/03/2022   11:17 AM 09/27/2022    9:22 AM 12/27/2021   12:03 PM 06/08/2020    1:26 PM 09/14/2019    2:28 PM  PHQ 2/9 Scores  PHQ - 2 Score  0 0 0  0 0  PHQ- 9 Score  0 0      Exception Documentation Other- indicate reason in comment box    Other- indicate reason in comment box    Not completed unable to participate    dementia      Fall Risk    01/07/2023   10:55 AM 11/05/2022   10:02 AM 09/27/2022    9:22 AM 12/27/2021   12:03 PM 06/08/2020    1:25 PM  Fall Risk   Falls in the past year? 1 0 0 0 1  Number falls in past yr: 0 0 0 0 0  Injury with Fall? 0 0 0 0 1  Risk for fall due to : History of fall(s);Impaired balance/gait;Impaired mobility No Fall Risks No Fall Risks Impaired balance/gait;Impaired mobility   Follow up  Falls evaluation completed Falls evaluation  completed       FALL RISK PREVENTION PERTAINING TO THE HOME:  Any stairs in or around the home? Yes  If so, are there any without handrails? No  Home free of loose throw rugs in walkways, pet beds, electrical cords, etc? No  Adequate lighting in your home to reduce risk of falls? No   ASSISTIVE DEVICES UTILIZED TO PREVENT FALLS:  Life alert? No  Use of a cane, walker or w/c? Yes  Grab bars in the bathroom? Yes  Shower chair or bench in shower? Yes  Elevated toilet seat or a handicapped toilet? Yes   TIMED UP AND GO:  Was the test performed? No .  Nonambulatory   Cognitive Function:    01/07/2023   10:55 AM 12/27/2021   12:04 PM 07/07/2015    2:43 PM  MMSE - Mini Mental State Exam  Not completed: Unable to complete Unable to complete   Orientation to time   2  Orientation to Place   2  Registration   3  Attention/ Calculation   5  Recall   0  Language- name 2 objects   2  Language- repeat   0  Language- follow 3 step command   3  Language- read & follow direction   1  Write a sentence   1  Copy design   0  Total score   19        03/31/2018    1:38 PM  6CIT Screen  What Year? 4 points  What month? 3 points  What time? 3 points  Count back from 20 4 points  Months in reverse 4 points  Repeat phrase 6 points  Total Score 24 points    Immunizations Immunization History  Administered Date(s) Administered   Fluad Quad(high Dose 65+) 04/29/2019, 06/08/2020   Influenza,inj,Quad PF,6+ Mos 07/07/2015, 05/10/2016, 05/29/2017, 06/25/2018   Influenza-Unspecified 06/02/2021, 05/24/2022   Moderna Covid-19 Vaccine Bivalent Booster 91yrs & up 03/16/2021   Moderna SARS-COV2 Booster Vaccination 11/16/2020, 08/02/2021   Moderna Sars-Covid-2 Vaccination 10/04/2019, 11/01/2019   PNEUMOCOCCAL CONJUGATE-20 11/30/2021   Pneumococcal Conjugate-13 12/25/2016   Pneumococcal Polysaccharide-23 06/25/2018   Rsv, Bivalent, Protein Subunit Rsvpref,pf Verdis Frederickson) 07/23/2022   Tdap  07/07/2015   Zoster Recombinat (Shingrix) 12/15/2021, 03/14/2022    TDAP status: Due, Education has been provided regarding the importance of this vaccine. Advised may receive this vaccine at local pharmacy or Health Dept. Aware to provide a copy of the vaccination record if obtained from local pharmacy or Health Dept. Verbalized acceptance and understanding.  Flu Vaccine status: Up to date  Pneumococcal vaccine status: Up to date  Covid-19 vaccine status: Completed vaccines  Qualifies for Shingles Vaccine? No   Zostavax completed No   Shingrix Completed?: No.    Education has been provided regarding the importance of this vaccine. Patient has been advised to call insurance company to determine out of pocket expense if they have not yet received this vaccine. Advised may also receive vaccine at local pharmacy or Health Dept. Verbalized acceptance and understanding.  Screening Tests Health Maintenance  Topic Date Due   INFLUENZA VACCINE  03/21/2023   Medicare Annual Wellness (AWV)  01/07/2024   DTaP/Tdap/Td (2 - Td or Tdap) 07/06/2025   Pneumonia Vaccine 25+ Years old  Completed   Zoster Vaccines- Shingrix  Completed   HPV VACCINES  Aged Out   DEXA SCAN  Discontinued   COVID-19 Vaccine  Discontinued    Health Maintenance  There are no preventive care reminders to display for this patient.   Colorectal cancer screening: No longer required.   Mammogram status: No longer required due to age.  Bone Density status: Completed 04-04-22. Results reflect: Bone density results: OSTEOPENIA. Repeat every 2 years.  Lung Cancer Screening: (Low Dose CT Chest recommended if Age 44-80 years, 30 pack-year currently smoking OR have quit w/in 15years.) does not qualify.   Lung Cancer Screening Referral: n/a  Additional Screening:  Hepatitis C Screening: does not qualify; Completed   Vision Screening: Recommended annual ophthalmology exams for early detection of glaucoma and other disorders  of the eye. Is the patient up to date with their annual eye exam?  No  Who is the provider or what is the name of the office in which the patient attends annual eye exams?  If pt is not established with a provider, would they like to be referred to a provider to establish care? No .   Dental Screening: Recommended annual dental exams for proper oral hygiene  Community Resource Referral / Chronic Care Management: CRR required this visit?  No   CCM required this visit?  No      Plan:     I have personally reviewed and noted the following in the patient's chart:   Medical and social history Use of alcohol, tobacco or illicit drugs  Current medications and supplements including opioid prescriptions. Patient is not currently taking opioid prescriptions. Functional ability and status Nutritional status Physical activity Advanced directives List of other physicians Hospitalizations, surgeries, and ER visits in previous 12 months Vitals Screenings to include cognitive, depression, and falls Referrals and appointments  In addition, I have reviewed and discussed with patient certain preventive protocols, quality metrics, and best practice recommendations. A written personalized care plan for preventive services as well as general preventive health recommendations were provided to patient.     Sharee Holster, NP   01/07/2023   Nurse Notes: this exam was performed at this facility by myself

## 2023-01-10 ENCOUNTER — Non-Acute Institutional Stay (SKILLED_NURSING_FACILITY): Payer: Medicare Other | Admitting: Adult Health

## 2023-01-10 ENCOUNTER — Encounter: Payer: Self-pay | Admitting: Adult Health

## 2023-01-10 DIAGNOSIS — R001 Bradycardia, unspecified: Secondary | ICD-10-CM | POA: Diagnosis not present

## 2023-01-10 DIAGNOSIS — F02C0 Dementia in other diseases classified elsewhere, severe, without behavioral disturbance, psychotic disturbance, mood disturbance, and anxiety: Secondary | ICD-10-CM

## 2023-01-10 DIAGNOSIS — Z515 Encounter for palliative care: Secondary | ICD-10-CM | POA: Diagnosis not present

## 2023-01-10 DIAGNOSIS — E43 Unspecified severe protein-calorie malnutrition: Secondary | ICD-10-CM | POA: Diagnosis not present

## 2023-01-10 DIAGNOSIS — G301 Alzheimer's disease with late onset: Secondary | ICD-10-CM

## 2023-01-10 DIAGNOSIS — I10 Essential (primary) hypertension: Secondary | ICD-10-CM | POA: Diagnosis not present

## 2023-01-10 DIAGNOSIS — E46 Unspecified protein-calorie malnutrition: Secondary | ICD-10-CM | POA: Diagnosis not present

## 2023-01-10 NOTE — Progress Notes (Signed)
Location:  [facility] Room Number: 149 Place of Service:  SNF (31)   CODE STATUS: DNR  No Known Allergies  Chief Complaint  Patient presents with   Medical Management of Chronic Issues               Severe late onset alzheimer's disease without behavioral disturbance; psychotic disturbance; mood disturbance or anxiety:     Essential hypertension:   Severe protein calorie malnutrition      HPI:  She is a 89 year old long term resident of this facility being seen for the management of her chronic illnesses: Severe late onset alzheimer's disease without behavioral disturbance; psychotic disturbance; mood disturbance or anxiety:     Essential hypertension:   Severe protein calorie malnutrition. There are no reports of uncontrolled pain; her weight is stable with a good appetite   Past Medical History:  Diagnosis Date   Allergic rhinitis 11/25/2016   Arthritis    mild, no known falls uses cane   Dementia (HCC)    Hypertension    Neurofibroma 05/29/2017   Right breast skin lesion- pathology neurofibroma     Past Surgical History:  Procedure Laterality Date   MASS EXCISION Right 06/07/2017   Procedure: EXCISION SKIN LESION OF RIGHT BREAST;  Surgeon: Lucretia Roers, MD;  Location: [facility];  Service: General;  Laterality: Right;    Social History   Socioeconomic History   Marital status: Widowed    Spouse name: Not on file   Number of children: 0   Years of education: Not on file   Highest education level: Not on file  Occupational History   Not on file  Tobacco Use   Smoking status: Never   Smokeless tobacco: Never  Vaping Use   Vaping Use: Never used  Substance and Sexual Activity   Alcohol use: No   Drug use: No   Sexual activity: Not Currently  Other Topics Concern   Not on file  Social History Narrative   Not on file   Social Determinants of Health   Financial Resource Strain: Low Risk  (03/31/2018)   Overall Financial Resource  Strain (CARDIA)    Difficulty of Paying Living Expenses: Not hard at all  Food Insecurity: No Food Insecurity (03/31/2018)   Hunger Vital Sign    Worried About Running Out of Food in the Last Year: Never true    Ran Out of Food in the Last Year: Never true  Transportation Needs: No Transportation Needs (03/31/2018)   PRAPARE - Administrator, Civil Service (Medical): No    Lack of Transportation (Non-Medical): No  Physical Activity: Inactive (03/31/2018)   Exercise Vital Sign    Days of Exercise per Week: 0 days    Minutes of Exercise per Session: 0 min  Stress: No Stress Concern Present (03/31/2018)   [employer] of Occupational Health - Occupational Stress Questionnaire    Feeling of Stress : Not at all  Social Connections: Somewhat Isolated (03/31/2018)   Social Connection and Isolation Panel [NHANES]    Frequency of Communication with Friends and Family: More than three times a week    Frequency of Social Gatherings with Friends and Family: Once a week    Attends Religious Services: 1 to 4 times per year    Active Member of [employer] or Organizations: No    Attends Banker Meetings: Never    Marital Status: Widowed  Intimate Partner Violence: Not At Risk (03/31/2018)  Humiliation, Afraid, Rape, and Kick questionnaire    Fear of Current or Ex-Partner: No    Emotionally Abused: No    Physically Abused: No    Sexually Abused: No   Family History  Problem Relation Age of Onset   Diabetes Sister    Hypertension Sister       VITAL SIGNS BP 118/70   Pulse (!) 55   Temp 98.1 F (36.7 C)   Resp 15   Ht 5\' 2"  (1.575 m)   Wt 156 lb 6.4 oz (70.9 kg)   SpO2 97%   BMI 28.61 kg/m   Outpatient Encounter Medications as of 01/10/2023  Medication Sig   acetaminophen (TYLENOL) 325 MG tablet Take 650 mg by mouth every 6 (six) hours as needed.   albuterol (PROVENTIL) (2.5 MG/3ML) 0.083% nebulizer solution Take 2.5 mg by nebulization every 4 (four) hours as  needed for wheezing or shortness of breath.   Amino Acids-Protein Hydrolys (FEEDING SUPPLEMENT, PRO-STAT SUGAR FREE 64,) LIQD Take 30 mLs by mouth 3 (three) times daily with meals.   ascorbic acid (VITAMIN C) 500 MG tablet Take 500 mg by mouth daily.   aspirin EC 325 MG tablet Take 325 mg by mouth daily.   NON FORMULARY Diet: Dysphagia 1 (puree) with thin liquids   polyethylene glycol (MIRALAX / GLYCOLAX) 17 g packet Take 17 g by mouth daily.   polyvinyl alcohol (LIQUIFILM TEARS) 1.4 % ophthalmic solution Place 1 drop into both eyes in the morning and at bedtime. Wait at least 5 minutes between multiple drops in same eye.   No facility-administered encounter medications on file as of 01/10/2023.     SIGNIFICANT DIAGNOSTIC EXAMS  PREVIOUS   05-02-22: right lower extremity doppler: acute DVT common femoral vein   NO NEW EXAMS    LABS REVIEWED:   04-30-22: wbc 5.3; hgb 10.0; hct 32.3; mcv 92.3 plt 201; glucose 96; bun 22; creat 0.87; k+ 3.8; na++ 139; ca 8.4; gfr 59; protein 5.6 albumin 2.7 05-03-22: iron 41; tibc 303; vitamin B 12: 238 06-05-22: wbc 3.2; hgb 12.2; hct 37.5; mcv 95.2 plt 82   07-19-22: wbc 4.8; hgb 12.8; hct 41.1; mcv 94.3 plt 161 09-16-22: wbc 5.1; hgb 12.4; hct 39.6; mcv 97.5 plt 125; glucose 88; bun 22; creat 1.17; k+ 3.4; na++ 142; ca 7.9; gfr 41  TODAY  11-08-22: vitamin B 12: 256       Review of Systems  Unable to perform ROS: Dementia   Physical Exam Constitutional:      General: She is not in acute distress.    Appearance: She is well-developed. She is not diaphoretic.  Neck:     Thyroid: No thyromegaly.  Cardiovascular:     Rate and Rhythm: Regular rhythm. Bradycardia present.     Pulses: Normal pulses.     Heart sounds: Normal heart sounds.  Pulmonary:     Effort: Pulmonary effort is normal. No respiratory distress.     Breath sounds: Normal breath sounds.  Abdominal:     General: Bowel sounds are normal. There is no distension.     Palpations:  Abdomen is soft.     Tenderness: There is no abdominal tenderness.  Musculoskeletal:        General: Normal range of motion.     Cervical back: Neck supple.     Right lower leg: No edema.     Left lower leg: No edema.  Lymphadenopathy:     Cervical: No cervical adenopathy.  Skin:  General: Skin is warm and dry.  Neurological:     Mental Status: She is alert. Mental status is at baseline.  Psychiatric:        Mood and Affect: Mood normal.        ASSESSMENT/ PLAN:  TODAY  Severe late onset alzheimer's disease without behavioral disturbance; psychotic disturbance; mood disturbance or anxiety: weight is 156 pounds will monitor   2. Essential hypertension: b/p 118/70  3. Severe protein calorie malnutrition: protein 5.6 albumin 2.7 will continue prostat three times daily   PREVIOUS   4. Anemia due to CKD stage 3: hgb 12.4; is off iron   5. Aortic atherosclerosis (ct 11-16-17) not on statin due to advanced age  53. Thrombocytopenia: plt 125 will monitor  7. Chronic constipation: will continue miralax daily   8. Hyperlipidemia: LDL goal <130: is off lipitor due to advanced age  72. Chronic kidney disease stage 3a: bun 22; creat 1.17; gfr 41  10. Chronic respiratory failure with hypoxia: will monitor   Will lower asa to 81 mg daily and will stop vitamin C    Synthia Innocent NP [facility]  call [phone]

## 2023-01-25 ENCOUNTER — Encounter: Payer: Self-pay | Admitting: Adult Health

## 2023-01-25 ENCOUNTER — Other Ambulatory Visit (HOSPITAL_COMMUNITY)
Admission: RE | Admit: 2023-01-25 | Discharge: 2023-01-25 | Disposition: A | Discharge status: Home / Self Care | Payer: Medicare Other | Source: Skilled Nursing Facility | Attending: Adult Health | Admitting: Adult Health

## 2023-01-25 ENCOUNTER — Non-Acute Institutional Stay (SKILLED_NURSING_FACILITY): Payer: Medicare Other | Admitting: Adult Health

## 2023-01-25 DIAGNOSIS — G301 Alzheimer's disease with late onset: Secondary | ICD-10-CM | POA: Diagnosis not present

## 2023-01-25 DIAGNOSIS — F02C0 Dementia in other diseases classified elsewhere, severe, without behavioral disturbance, psychotic disturbance, mood disturbance, and anxiety: Secondary | ICD-10-CM | POA: Diagnosis not present

## 2023-01-25 DIAGNOSIS — I700 Atherosclerosis of aorta: Secondary | ICD-10-CM

## 2023-01-25 DIAGNOSIS — J9611 Chronic respiratory failure with hypoxia: Secondary | ICD-10-CM

## 2023-01-25 DIAGNOSIS — E43 Unspecified severe protein-calorie malnutrition: Secondary | ICD-10-CM | POA: Diagnosis not present

## 2023-01-25 LAB — ALBUMIN: Albumin: 3 g/dL — ABNORMAL LOW (ref 3.5–5.0)

## 2023-01-25 NOTE — Progress Notes (Signed)
Location:  [facility] Room Number: 149D Place of Service:  SNF (31)   CODE STATUS: DNR  No Known Allergies  Chief Complaint  Patient presents with   Acute Visit    Care plan meeting    HPI:  We have come together for her care plan meeting. Family present. BIMS 5/15 mood 3/30: decrease energy. She is nonambulatory with 1 fall without injury. She requires moderate to maximum assist with her adls. She is incontinent of bladder and bowel. Dietary:  puree diet appetite 50-75%; weight is 156.4 pounds; requires set up for meals. Therapy: none at this time. Activities: does participate. She will continue to be followed for her chronic illnesses including:   aortic atherosclerosis . Chronic respiratory failure with hypoxia   Severe late onset alzheimer's dementia without behavioral disturbance; psychotic disturbance; mood disturbance or anxiety.   Past Medical History:  Diagnosis Date   Allergic rhinitis 11/25/2016   Arthritis    mild, no known falls uses cane   Dementia (HCC)    Hypertension    Neurofibroma 05/29/2017   Right breast skin lesion- pathology neurofibroma     Past Surgical History:  Procedure Laterality Date   MASS EXCISION Right 06/07/2017   Procedure: EXCISION SKIN LESION OF RIGHT BREAST;  Surgeon: Lucretia Roers, MD;  Location: [facility];  Service: General;  Laterality: Right;    Social History   Socioeconomic History   Marital status: Widowed    Spouse name: Not on file   Number of children: 0   Years of education: Not on file   Highest education level: Not on file  Occupational History   Not on file  Tobacco Use   Smoking status: Never   Smokeless tobacco: Never  Vaping Use   Vaping Use: Never used  Substance and Sexual Activity   Alcohol use: No   Drug use: No   Sexual activity: Not Currently  Other Topics Concern   Not on file  Social History Narrative   Not on file   Social Determinants of Health   Financial Resource  Strain: Low Risk  (03/31/2018)   Overall Financial Resource Strain (CARDIA)    Difficulty of Paying Living Expenses: Not hard at all  Food Insecurity: No Food Insecurity (03/31/2018)   Hunger Vital Sign    Worried About Running Out of Food in the Last Year: Never true    Ran Out of Food in the Last Year: Never true  Transportation Needs: No Transportation Needs (03/31/2018)   PRAPARE - Administrator, Civil Service (Medical): No    Lack of Transportation (Non-Medical): No  Physical Activity: Inactive (03/31/2018)   Exercise Vital Sign    Days of Exercise per Week: 0 days    Minutes of Exercise per Session: 0 min  Stress: No Stress Concern Present (03/31/2018)   [employer] of Occupational Health - Occupational Stress Questionnaire    Feeling of Stress : Not at all  Social Connections: Somewhat Isolated (03/31/2018)   Social Connection and Isolation Panel [NHANES]    Frequency of Communication with Friends and Family: More than three times a week    Frequency of Social Gatherings with Friends and Family: Once a week    Attends Religious Services: 1 to 4 times per year    Active Member of [employer] or Organizations: No    Attends Banker Meetings: Never    Marital Status: Widowed  Intimate Partner Violence: Not At Risk (03/31/2018)  Humiliation, Afraid, Rape, and Kick questionnaire    Fear of Current or Ex-Partner: No    Emotionally Abused: No    Physically Abused: No    Sexually Abused: No   Family History  Problem Relation Age of Onset   Diabetes Sister    Hypertension Sister       VITAL SIGNS BP (!) 140/52   Pulse 77   Temp 97.6 F (36.4 C)   Resp 18   Ht 5\' 2"  (1.575 m)   Wt 156 lb 8 oz (71 kg)   SpO2 98%   BMI 28.62 kg/m   Outpatient Encounter Medications as of 01/25/2023  Medication Sig   acetaminophen (TYLENOL) 325 MG tablet Take 650 mg by mouth every 6 (six) hours as needed.   albuterol (PROVENTIL) (2.5 MG/3ML) 0.083% nebulizer solution  Take 2.5 mg by nebulization every 4 (four) hours as needed for wheezing or shortness of breath.   Amino Acids-Protein Hydrolys (FEEDING SUPPLEMENT, PRO-STAT SUGAR FREE 64,) LIQD Take 30 mLs by mouth 3 (three) times daily with meals.   aspirin 81 MG chewable tablet Chew 81 mg by mouth daily.   NON FORMULARY Diet: Dysphagia 1 (puree) with thin liquids   polyethylene glycol (MIRALAX / GLYCOLAX) 17 g packet Take 17 g by mouth daily.   polyvinyl alcohol (LIQUIFILM TEARS) 1.4 % ophthalmic solution Place 1 drop into both eyes in the morning and at bedtime. Wait at least 5 minutes between multiple drops in same eye.   No facility-administered encounter medications on file as of 01/25/2023.     SIGNIFICANT DIAGNOSTIC EXAMS  PREVIOUS   05-02-22: right lower extremity doppler: acute DVT common femoral vein   NO NEW EXAMS    LABS REVIEWED:   04-30-22: wbc 5.3; hgb 10.0; hct 32.3; mcv 92.3 plt 201; glucose 96; bun 22; creat 0.87; k+ 3.8; na++ 139; ca 8.4; gfr 59; protein 5.6 albumin 2.7 05-03-22: iron 41; tibc 303; vitamin B 12: 238 06-05-22: wbc 3.2; hgb 12.2; hct 37.5; mcv 95.2 plt 82   07-19-22: wbc 4.8; hgb 12.8; hct 41.1; mcv 94.3 plt 161 09-16-22: wbc 5.1; hgb 12.4; hct 39.6; mcv 97.5 plt 125; glucose 88; bun 22; creat 1.17; k+ 3.4; na++ 142; ca 7.9; gfr 41  TODAY  11-08-22: vitamin B 12: 256 01-25-23: albumin 3.0         Review of Systems  Unable to perform ROS: Dementia    Physical Exam Constitutional:      General: She is not in acute distress.    Appearance: She is well-developed. She is not diaphoretic.  Neck:     Thyroid: No thyromegaly.  Cardiovascular:     Rate and Rhythm: Regular rhythm. Bradycardia present.     Pulses: Normal pulses.     Heart sounds: Normal heart sounds.  Pulmonary:     Effort: Pulmonary effort is normal. No respiratory distress.     Breath sounds: Normal breath sounds.  Abdominal:     General: Bowel sounds are normal. There is no distension.      Palpations: Abdomen is soft.     Tenderness: There is no abdominal tenderness.  Musculoskeletal:        General: Normal range of motion.     Cervical back: Neck supple.     Right lower leg: No edema.     Left lower leg: No edema.  Lymphadenopathy:     Cervical: No cervical adenopathy.  Skin:    General: Skin is warm and dry.  Neurological:     Mental Status: She is alert. Mental status is at baseline.  Psychiatric:        Mood and Affect: Mood normal.       ASSESSMENT/ PLAN:  TODAY  1 aortic atherosclerosis 2. Chronic respiratory failure with hypoxia 3. Severe late onset alzheimer's dementia without behavioral disturbance; psychotic disturbance; mood disturbance or anxiety.   Will will stop the prostat Will continue current plan of care Will continue to monitor her status.   Time spent with patient: medications; dietary; activities.     Synthia Innocent NP [facility]  call [phone]

## 2023-02-05 ENCOUNTER — Encounter: Payer: Self-pay | Admitting: Adult Health

## 2023-02-05 ENCOUNTER — Non-Acute Institutional Stay (SKILLED_NURSING_FACILITY): Payer: Medicare Other | Admitting: Adult Health

## 2023-02-05 DIAGNOSIS — D696 Thrombocytopenia, unspecified: Secondary | ICD-10-CM

## 2023-02-05 DIAGNOSIS — N1832 Chronic kidney disease, stage 3b: Secondary | ICD-10-CM | POA: Diagnosis not present

## 2023-02-05 DIAGNOSIS — I700 Atherosclerosis of aorta: Secondary | ICD-10-CM | POA: Diagnosis not present

## 2023-02-05 NOTE — Progress Notes (Signed)
Location:  [facility] Room Number: 149 Place of Service:  SNF (31)   CODE STATUS: DNR  No Known Allergies  Chief Complaint  Patient presents with   Medical Management of Chronic Issues                Anemia due to CKD stage 3:  Aortic atherosclerosis  Thrombocytopenia:     HPI:  She is a 89 year old long term resident of this facility being seen for the management of her chronic illnesses:  Anemia due to CKD stage 3:  Aortic atherosclerosis  Thrombocytopenia. There are no reports of uncontrolled pain. Her weight remains stable. She has been taken off prostat.   Past Medical History:  Diagnosis Date   Allergic rhinitis 11/25/2016   Arthritis    mild, no known falls uses cane   Dementia (HCC)    Hypertension    Neurofibroma 05/29/2017   Right breast skin lesion- pathology neurofibroma     Past Surgical History:  Procedure Laterality Date   MASS EXCISION Right 06/07/2017   Procedure: EXCISION SKIN LESION OF RIGHT BREAST;  Surgeon: Lucretia Roers, MD;  Location: [facility];  Service: General;  Laterality: Right;    Social History   Socioeconomic History   Marital status: Widowed    Spouse name: Not on file   Number of children: 0   Years of education: Not on file   Highest education level: Not on file  Occupational History   Not on file  Tobacco Use   Smoking status: Never   Smokeless tobacco: Never  Vaping Use   Vaping Use: Never used  Substance and Sexual Activity   Alcohol use: No   Drug use: No   Sexual activity: Not Currently  Other Topics Concern   Not on file  Social History Narrative   Not on file   Social Determinants of Health   Financial Resource Strain: Low Risk  (03/31/2018)   Overall Financial Resource Strain (CARDIA)    Difficulty of Paying Living Expenses: Not hard at all  Food Insecurity: No Food Insecurity (03/31/2018)   Hunger Vital Sign    Worried About Running Out of Food in the Last Year: Never true    Ran  Out of Food in the Last Year: Never true  Transportation Needs: No Transportation Needs (03/31/2018)   PRAPARE - Administrator, Civil Service (Medical): No    Lack of Transportation (Non-Medical): No  Physical Activity: Inactive (03/31/2018)   Exercise Vital Sign    Days of Exercise per Week: 0 days    Minutes of Exercise per Session: 0 min  Stress: No Stress Concern Present (03/31/2018)   [employer] of Occupational Health - Occupational Stress Questionnaire    Feeling of Stress : Not at all  Social Connections: Somewhat Isolated (03/31/2018)   Social Connection and Isolation Panel [NHANES]    Frequency of Communication with Friends and Family: More than three times a week    Frequency of Social Gatherings with Friends and Family: Once a week    Attends Religious Services: 1 to 4 times per year    Active Member of [employer] or Organizations: No    Attends Banker Meetings: Never    Marital Status: Widowed  Intimate Partner Violence: Not At Risk (03/31/2018)   Humiliation, Afraid, Rape, and Kick questionnaire    Fear of Current or Ex-Partner: No    Emotionally Abused: No    Physically Abused:  No    Sexually Abused: No   Family History  Problem Relation Age of Onset   Diabetes Sister    Hypertension Sister       VITAL SIGNS BP (!) 140/68   Pulse (!) 50   Temp (!) 97.5 F (36.4 C)   Resp 16   Ht 5\' 2"  (1.575 m)   Wt 156 lb 8 oz (71 kg)   SpO2 94%   BMI 28.62 kg/m   Outpatient Encounter Medications as of 02/05/2023  Medication Sig   acetaminophen (TYLENOL) 325 MG tablet Take 650 mg by mouth every 6 (six) hours as needed.   albuterol (PROVENTIL) (2.5 MG/3ML) 0.083% nebulizer solution Take 2.5 mg by nebulization every 4 (four) hours as needed for wheezing or shortness of breath.   aspirin 81 MG chewable tablet Chew 81 mg by mouth daily.   NON FORMULARY Diet: Dysphagia 1 (puree) with thin liquids   polyethylene glycol (MIRALAX / GLYCOLAX) 17 g  packet Take 17 g by mouth daily.   polyvinyl alcohol (LIQUIFILM TEARS) 1.4 % ophthalmic solution Place 1 drop into both eyes in the morning and at bedtime. Wait at least 5 minutes between multiple drops in same eye.   [DISCONTINUED] Amino Acids-Protein Hydrolys (FEEDING SUPPLEMENT, PRO-STAT SUGAR FREE 64,) LIQD Take 30 mLs by mouth 3 (three) times daily with meals.   No facility-administered encounter medications on file as of 02/05/2023.     SIGNIFICANT DIAGNOSTIC EXAMS  PREVIOUS   05-02-22: right lower extremity doppler: acute DVT common femoral vein   NO NEW EXAMS    LABS REVIEWED:   04-30-22: wbc 5.3; hgb 10.0; hct 32.3; mcv 92.3 plt 201; glucose 96; bun 22; creat 0.87; k+ 3.8; na++ 139; ca 8.4; gfr 59; protein 5.6 albumin 2.7 05-03-22: iron 41; tibc 303; vitamin B 12: 238 06-05-22: wbc 3.2; hgb 12.2; hct 37.5; mcv 95.2 plt 82   07-19-22: wbc 4.8; hgb 12.8; hct 41.1; mcv 94.3 plt 161 09-16-22: wbc 5.1; hgb 12.4; hct 39.6; mcv 97.5 plt 125; glucose 88; bun 22; creat 1.17; k+ 3.4; na++ 142; ca 7.9; gfr 41  TODAY  11-08-22: vitamin B 12: 256  01-25-23: albumin 3.0       Review of Systems  Unable to perform ROS: Dementia   Physical Exam Constitutional:      General: She is not in acute distress.    Appearance: She is well-developed. She is not diaphoretic.  Neck:     Thyroid: No thyromegaly.  Cardiovascular:     Rate and Rhythm: Regular rhythm. Bradycardia present.     Pulses: Normal pulses.     Heart sounds: Normal heart sounds.  Pulmonary:     Effort: Pulmonary effort is normal. No respiratory distress.     Breath sounds: Normal breath sounds.  Abdominal:     General: Bowel sounds are normal. There is no distension.     Palpations: Abdomen is soft.     Tenderness: There is no abdominal tenderness.  Musculoskeletal:        General: Normal range of motion.     Cervical back: Neck supple.     Right lower leg: No edema.     Left lower leg: No edema.  Lymphadenopathy:      Cervical: No cervical adenopathy.  Skin:    General: Skin is warm and dry.  Neurological:     Mental Status: She is alert. Mental status is at baseline.  Psychiatric:        Mood  and Affect: Mood normal.      ASSESSMENT/ PLAN:  TODAY  Anemia due to CKD stage 3: hgb 12.4 is off iron  2. Aortic atherosclerosis (ct 11-16-17); not on statin due to advanced age  56. Thrombocytopenia: plt 125 will monitor     PREVIOUS   4. Chronic constipation: will continue miralax daily   5. Hyperlipidemia: LDL goal <130: is off lipitor due to advanced age  55. Chronic kidney disease stage 3a: bun 22; creat 1.17; gfr 41  7. Chronic respiratory failure with hypoxia: will monitor   8. Severe late onset alzheimer's disease without behavioral disturbance; psychotic disturbance; mood disturbance or anxiety: weight is 156 pounds will monitor   9. Essential hypertension: b/p 14068  10. Severe protein calorie malnutrition: protein 5.6 albumin 3.0 will monitor    Synthia Innocent NP [facility]  call [phone]

## 2023-02-14 DIAGNOSIS — Z515 Encounter for palliative care: Secondary | ICD-10-CM | POA: Diagnosis not present

## 2023-02-14 DIAGNOSIS — G301 Alzheimer's disease with late onset: Secondary | ICD-10-CM | POA: Diagnosis not present

## 2023-02-27 ENCOUNTER — Non-Acute Institutional Stay (SKILLED_NURSING_FACILITY): Payer: Medicare Other | Admitting: Internal Medicine

## 2023-02-27 ENCOUNTER — Encounter: Payer: Self-pay | Admitting: Internal Medicine

## 2023-02-27 DIAGNOSIS — G301 Alzheimer's disease with late onset: Secondary | ICD-10-CM | POA: Diagnosis not present

## 2023-02-27 DIAGNOSIS — F02C0 Dementia in other diseases classified elsewhere, severe, without behavioral disturbance, psychotic disturbance, mood disturbance, and anxiety: Secondary | ICD-10-CM

## 2023-02-27 DIAGNOSIS — D696 Thrombocytopenia, unspecified: Secondary | ICD-10-CM

## 2023-02-27 DIAGNOSIS — N1832 Chronic kidney disease, stage 3b: Secondary | ICD-10-CM

## 2023-02-27 DIAGNOSIS — I10 Essential (primary) hypertension: Secondary | ICD-10-CM | POA: Diagnosis not present

## 2023-02-27 NOTE — Assessment & Plan Note (Signed)
The current blood pressure is an outlier and recent recorded blood pressures were 102/64 and 139/69.  Continue to monitor; at this time there is no indication for initiation of antihypertensives.

## 2023-02-27 NOTE — Patient Instructions (Signed)
See assessment and plan under each diagnosis in the problem list and acutely for this visit 

## 2023-02-27 NOTE — Progress Notes (Unsigned)
NURSING HOME LOCATION:  [facility] ROOM NUMBER: [id number] D   CODE STATUS: DNR  PCP:  Synthia Innocent NP  This is a nursing facility follow up visit of chronic medical diagnoses & to document compliance with Regulation 483.30 (c) in The Long Term Care Survey Manual Phase 2 which mandates caregiver visit ( visits can alternate among physician, PA or NP as per statutes) within 10 days of 30 days / 60 days/ 90 days post admission to SNF date    Interim medical record and care since last SNF visit was updated with review of diagnostic studies and change in clinical status since last visit were documented.  HPI: She is a permanent resident of the facility with medical diagnoses of essential hypertension, neurofibroma of the breast, vascular dementia, Her only current lab was an albumin of 3.0 on 01/25/2023, up from a prior value of 2.7.  She does have CKD with a GFR of 41, down from a prior value of 59, suggesting AKI from high stage IIIa down to stage IIIb.  Serially she has had varying thrombocytopenia with the most recent platelet count of 125,000, up from a nadir of 82,000.  Review of systems: Dementia invalidated responses.  She could not tell me how old she was.  She gave her date of birth [birth year]  [birth year].  She then stated "it has been so long I have forgotten."  She went on to confabulate about having been born in [place] and asked if I knew her relatives the "[place] Passey family."  She denies being on any medications.  She denied any active symptoms.  Constitutional: No fever, significant weight change, fatigue  Eyes: No redness, discharge, pain, vision change ENT/mouth: No nasal congestion,  purulent discharge, earache, change in hearing, sore throat  Cardiovascular: No chest pain, palpitations, paroxysmal nocturnal dyspnea, claudication, edema  Respiratory: No cough, sputum production, hemoptysis, DOE, significant snoring, apnea   Gastrointestinal: No heartburn, dysphagia, abdominal  pain, nausea /vomiting, rectal bleeding, melena, change in bowels Genitourinary: No dysuria, hematuria, pyuria, incontinence, nocturia Musculoskeletal: No joint stiffness, joint swelling, weakness, pain Dermatologic: No rash, pruritus, change in appearance of skin Neurologic: No dizziness, headache, syncope, seizures, numbness, tingling Psychiatric: No significant anxiety, depression, insomnia, anorexia Endocrine: No change in hair/skin/nails, excessive thirst, excessive hunger, excessive urination  Hematologic/lymphatic: No significant bruising, lymphadenopathy, abnormal bleeding Allergy/immunology: No itchy/watery eyes, significant sneezing, urticaria, angioedema  Physical exam:  Pertinent or positive findings: She appears decades younger than her age.  There is no facial wrinkling.  She has a little teeth.  There is close clot hirsutism over the chin and jaw.  Heart rate is slow with suggestive of flow murmur.  She has minor low-grade rales.  There is protuberant.  Dorsalis pedis pulses are stronger than posterior tibial pulses.  General appearance: Adequately nourished; no acute distress, increased work of breathing is present.   Lymphatic: No lymphadenopathy about the head, neck, axilla. Eyes: No conjunctival inflammation or lid edema is present. There is no scleral icterus. Ears:  External ear exam shows no significant lesions or deformities.   Nose:  External nasal examination shows no deformity or inflammation. Nasal mucosa are pink and moist without lesions, exudates Oral exam:  Lips and gums are healthy appearing. There is no oropharyngeal erythema or exudate. Neck:  No thyromegaly, masses, tenderness noted.    Heart:  Normal rate and regular rhythm. S1 and S2 normal without gallop, murmur, click, rub .  Lungs: Chest clear to auscultation without wheezes,  rhonchi, rales, rubs. Abdomen: Bowel sounds are normal. Abdomen is soft and nontender with no organomegaly, hernias, masses. GU:  Deferred  Extremities:  No cyanosis, clubbing, edema  Neurologic exam : Cn 2-7 intact Strength equal  in upper & lower extremities Balance, Rhomberg, finger to nose testing could not be completed due to clinical state Deep tendon reflexes are equal Skin: Warm & dry w/o tenting. No significant lesions or rash.  See summary under each active problem in the Problem List with associated updated therapeutic plan

## 2023-02-27 NOTE — Assessment & Plan Note (Signed)
Serially the platelet count has improved with most recent value of 125,000.  Staff has not reported any bleeding dyscrasias.  Continue to monitor.

## 2023-02-27 NOTE — Assessment & Plan Note (Signed)
She is pleasantly demented confabulating about her family and growing up in [place], [place].  She denies any active symptoms.  She was unable to give me her birthdate or exact age.

## 2023-02-27 NOTE — Assessment & Plan Note (Signed)
She is on no nephrotoxic agents.  Monitor will be discussed with the [facility] NP.

## 2023-04-17 ENCOUNTER — Non-Acute Institutional Stay (SKILLED_NURSING_FACILITY): Payer: Medicare Other | Admitting: Adult Health

## 2023-04-17 ENCOUNTER — Encounter: Payer: Self-pay | Admitting: Adult Health

## 2023-04-17 DIAGNOSIS — Z66 Do not resuscitate: Secondary | ICD-10-CM | POA: Diagnosis not present

## 2023-04-17 DIAGNOSIS — E785 Hyperlipidemia, unspecified: Secondary | ICD-10-CM

## 2023-04-17 DIAGNOSIS — K5909 Other constipation: Secondary | ICD-10-CM | POA: Diagnosis not present

## 2023-04-17 DIAGNOSIS — N1832 Chronic kidney disease, stage 3b: Secondary | ICD-10-CM

## 2023-04-17 NOTE — Progress Notes (Signed)
Location:  [facility] Room Number: 149 D Place of Service:  SNF (31)   CODE STATUS: DNR  No Known Allergies  Chief Complaint  Patient presents with   Medical Management of Chronic Issues            Chronic constipation:        Hyperlipidemia LDL goal <130:     Chronic kidney disease stage 3a     HPI:  She is a 89 year old long term resident of this facility being seen for the management of her chronic illnesses: Chronic constipation:        Hyperlipidemia LDL goal <130:     Chronic kidney disease stage 3a. There are no reports of uncontrolled pain. She continues to get out of bed daily. Her weight remains stable.   Past Medical History:  Diagnosis Date   Allergic rhinitis 11/25/2016   Arthritis    mild, no known falls uses cane   Dementia (HCC)    Hypertension    Neurofibroma 05/29/2017   Right breast skin lesion- pathology neurofibroma     Past Surgical History:  Procedure Laterality Date   MASS EXCISION Right 06/07/2017   Procedure: EXCISION SKIN LESION OF RIGHT BREAST;  Surgeon: Lucretia Roers, MD;  Location: [facility];  Service: General;  Laterality: Right;    Social History   Socioeconomic History   Marital status: Widowed    Spouse name: Not on file   Number of children: 0   Years of education: Not on file   Highest education level: Not on file  Occupational History   Not on file  Tobacco Use   Smoking status: Never   Smokeless tobacco: Never  Vaping Use   Vaping status: Never Used  Substance and Sexual Activity   Alcohol use: No   Drug use: No   Sexual activity: Not Currently  Other Topics Concern   Not on file  Social History Narrative   Not on file   Social Determinants of Health   Financial Resource Strain: Low Risk  (03/31/2018)   Overall Financial Resource Strain (CARDIA)    Difficulty of Paying Living Expenses: Not hard at all  Food Insecurity: No Food Insecurity (03/31/2018)   Hunger Vital Sign    Worried About  Running Out of Food in the Last Year: Never true    Ran Out of Food in the Last Year: Never true  Transportation Needs: No Transportation Needs (03/31/2018)   PRAPARE - Administrator, Civil Service (Medical): No    Lack of Transportation (Non-Medical): No  Physical Activity: Inactive (03/31/2018)   Exercise Vital Sign    Days of Exercise per Week: 0 days    Minutes of Exercise per Session: 0 min  Stress: No Stress Concern Present (03/31/2018)   [employer] of Occupational Health - Occupational Stress Questionnaire    Feeling of Stress : Not at all  Social Connections: Somewhat Isolated (03/31/2018)   Social Connection and Isolation Panel [NHANES]    Frequency of Communication with Friends and Family: More than three times a week    Frequency of Social Gatherings with Friends and Family: Once a week    Attends Religious Services: 1 to 4 times per year    Active Member of [employer] or Organizations: No    Attends Banker Meetings: Never    Marital Status: Widowed  Intimate Partner Violence: Not At Risk (03/31/2018)   Humiliation, Afraid, Rape, and Kick questionnaire  Fear of Current or Ex-Partner: No    Emotionally Abused: No    Physically Abused: No    Sexually Abused: No   Family History  Problem Relation Age of Onset   Diabetes Sister    Hypertension Sister       VITAL SIGNS BP 133/70   Pulse (!) 50   Ht 5\' 2"  (1.575 m)   Wt 162 lb (73.5 kg)   SpO2 95%   BMI 29.63 kg/m   Outpatient Encounter Medications as of 04/17/2023  Medication Sig   acetaminophen (TYLENOL) 325 MG tablet Take 650 mg by mouth every 6 (six) hours as needed.   albuterol (PROVENTIL) (2.5 MG/3ML) 0.083% nebulizer solution Take 2.5 mg by nebulization every 4 (four) hours as needed for wheezing or shortness of breath.   aspirin 81 MG chewable tablet Chew 81 mg by mouth daily.   NON FORMULARY Diet: Dysphagia 1 (puree) with thin liquids   polyethylene glycol (MIRALAX / GLYCOLAX)  17 g packet Take 17 g by mouth daily.   polyvinyl alcohol (LIQUIFILM TEARS) 1.4 % ophthalmic solution Place 1 drop into both eyes in the morning and at bedtime. Wait at least 5 minutes between multiple drops in same eye.   No facility-administered encounter medications on file as of 04/17/2023.     SIGNIFICANT DIAGNOSTIC EXAMS  PREVIOUS   05-02-22: right lower extremity doppler: acute DVT common femoral vein   NO NEW EXAMS    LABS REVIEWED:   04-30-22: wbc 5.3; hgb 10.0; hct 32.3; mcv 92.3 plt 201; glucose 96; bun 22; creat 0.87; k+ 3.8; na++ 139; ca 8.4; gfr 59; protein 5.6 albumin 2.7 05-03-22: iron 41; tibc 303; vitamin B 12: 238 06-05-22: wbc 3.2; hgb 12.2; hct 37.5; mcv 95.2 plt 82   07-19-22: wbc 4.8; hgb 12.8; hct 41.1; mcv 94.3 plt 161 09-16-22: wbc 5.1; hgb 12.4; hct 39.6; mcv 97.5 plt 125; glucose 88; bun 22; creat 1.17; k+ 3.4; na++ 142; ca 7.9; gfr 41 11-08-22: vitamin B 12: 256  01-25-23: albumin 3.0     NO NEW LABS     Review of Systems  Unable to perform ROS: Dementia    Physical Exam Constitutional:      General: She is not in acute distress.    Appearance: She is well-developed. She is not diaphoretic.  HENT:     Mouth/Throat:     Mouth: Mucous membranes are moist.     Pharynx: Oropharynx is clear.  Eyes:     Conjunctiva/sclera: Conjunctivae normal.  Neck:     Thyroid: No thyromegaly.  Cardiovascular:     Rate and Rhythm: Regular rhythm. Bradycardia present.     Pulses: Normal pulses.     Heart sounds: Normal heart sounds.  Pulmonary:     Effort: Pulmonary effort is normal. No respiratory distress.     Breath sounds: Normal breath sounds.  Abdominal:     General: Bowel sounds are normal. There is no distension.     Palpations: Abdomen is soft.     Tenderness: There is no abdominal tenderness.  Musculoskeletal:     Cervical back: Neck supple.     Right lower leg: No edema.     Left lower leg: No edema.     Comments: Able to move all extremities    Lymphadenopathy:     Cervical: No cervical adenopathy.  Skin:    General: Skin is warm and dry.  Neurological:     Mental Status: She is alert. Mental status  is at baseline.  Psychiatric:        Mood and Affect: Mood normal.      ASSESSMENT/ PLAN:  TODAY   Chronic constipation: will continue miralax daily   2. Hyperlipidemia LDL goal <130: is off statin due to her advanced age   73. Chronic kidney disease stage 3a: bun 22; creat 1.17 gfr 41   PREVIOUS   4. Chronic respiratory failure with hypoxia: will monitor   5. Severe late onset alzheimer's disease without behavioral disturbance; psychotic disturbance; mood disturbance or anxiety: weight is 162 pounds will monitor   6. Essential hypertension: b/p 133/70  7. Severe protein calorie malnutrition: protein 5.6 albumin 3.0 will monitor  8. Anemia due to CKD stage 3: hgb 12.4 is off iron  9. Aortic atherosclerosis (ct 11-16-17); not on statin due to advanced age  75. Thrombocytopenia: plt 125 will monitor     Will check cbc; cmp   Synthia Innocent NP [facility]  call [phone]

## 2023-04-18 ENCOUNTER — Other Ambulatory Visit (HOSPITAL_COMMUNITY)
Admission: RE | Admit: 2023-04-18 | Discharge: 2023-04-18 | Disposition: A | Discharge status: Home / Self Care | Payer: Medicare Other | Source: Skilled Nursing Facility | Attending: Adult Health | Admitting: Adult Health

## 2023-04-18 DIAGNOSIS — I10 Essential (primary) hypertension: Secondary | ICD-10-CM | POA: Insufficient documentation

## 2023-04-18 LAB — COMPREHENSIVE METABOLIC PANEL
ALT: 12 U/L (ref 0–44)
AST: 17 U/L (ref 15–41)
Albumin: 2.9 g/dL — ABNORMAL LOW (ref 3.5–5.0)
Alkaline Phosphatase: 52 U/L (ref 38–126)
Anion gap: 8 (ref 5–15)
BUN: 18 mg/dL (ref 8–23)
CO2: 24 mmol/L (ref 22–32)
Calcium: 8.4 mg/dL — ABNORMAL LOW (ref 8.9–10.3)
Chloride: 107 mmol/L (ref 98–111)
Creatinine, Ser: 0.91 mg/dL (ref 0.44–1.00)
GFR, Estimated: 55 mL/min — ABNORMAL LOW (ref >60)
Glucose, Bld: 87 mg/dL (ref 70–99)
Potassium: 3.7 mmol/L (ref 3.5–5.1)
Sodium: 139 mmol/L (ref 135–145)
Total Bilirubin: 0.5 mg/dL (ref 0.3–1.2)
Total Protein: 5.5 g/dL — ABNORMAL LOW (ref 6.5–8.1)

## 2023-04-18 LAB — CBC
HCT: 39.4 % (ref 36.0–46.0)
Hemoglobin: 12.6 g/dL (ref 12.0–15.0)
MCH: 30.9 pg (ref 26.0–34.0)
MCHC: 32 g/dL (ref 30.0–36.0)
MCV: 96.6 fL (ref 80.0–100.0)
Platelets: 153 10*3/uL (ref 150–400)
RBC: 4.08 MIL/uL (ref 3.87–5.11)
RDW: 13.2 % (ref 11.5–15.5)
WBC: 5 10*3/uL (ref 4.0–10.5)
nRBC: 0 % (ref 0.0–0.2)

## 2023-05-02 ENCOUNTER — Encounter: Payer: Self-pay | Admitting: Adult Health

## 2023-05-02 ENCOUNTER — Non-Acute Institutional Stay (SKILLED_NURSING_FACILITY): Payer: Medicare Other | Admitting: Adult Health

## 2023-05-02 DIAGNOSIS — I700 Atherosclerosis of aorta: Secondary | ICD-10-CM

## 2023-05-02 DIAGNOSIS — F02C0 Dementia in other diseases classified elsewhere, severe, without behavioral disturbance, psychotic disturbance, mood disturbance, and anxiety: Secondary | ICD-10-CM | POA: Diagnosis not present

## 2023-05-02 DIAGNOSIS — G301 Alzheimer's disease with late onset: Secondary | ICD-10-CM

## 2023-05-02 DIAGNOSIS — J9611 Chronic respiratory failure with hypoxia: Secondary | ICD-10-CM

## 2023-05-02 NOTE — Progress Notes (Signed)
Location:  [facility] Room Number: 149 Place of Service:  SNF (31)   CODE STATUS: dnr   No Known Allergies  Chief Complaint  Patient presents with   Acute Visit    Care plan meeting     HPI:  We have come together for her care plan meeting. Family present. BIMS 5/15 mood 0/30; will decline showers.  she uses wheelchair with one fall without injury. She requires moderate to max assist with her adl care. She is incontinent of bladder and bowel. Dietary: D1 thin liquids requires setup for meals weight is 162.8 pounds appetite 51-100%. Therapy: none at this time. Activities: does attend. She continues to be followed for her chronic illnesses including: Aortic atherosclerosis     Chronic respiratory failure with hypoxia     Severe late onset alzheimer's disease without behavioral disturbance, psychotic disturbance, mood disturbance, or anxiety.   Past Medical History:  Diagnosis Date   Allergic rhinitis 11/25/2016   Arthritis    mild, no known falls uses cane   Dementia (HCC)    Hypertension    Neurofibroma 05/29/2017   Right breast skin lesion- pathology neurofibroma     Past Surgical History:  Procedure Laterality Date   MASS EXCISION Right 06/07/2017   Procedure: EXCISION SKIN LESION OF RIGHT BREAST;  Surgeon: Lucretia Roers, MD;  Location: [facility];  Service: General;  Laterality: Right;    Social History   Socioeconomic History   Marital status: Widowed    Spouse name: Not on file   Number of children: 0   Years of education: Not on file   Highest education level: Not on file  Occupational History   Not on file  Tobacco Use   Smoking status: Never   Smokeless tobacco: Never  Vaping Use   Vaping status: Never Used  Substance and Sexual Activity   Alcohol use: No   Drug use: No   Sexual activity: Not Currently  Other Topics Concern   Not on file  Social History Narrative   Not on file   Social Determinants of Health   Financial  Resource Strain: Low Risk  (03/31/2018)   Overall Financial Resource Strain (CARDIA)    Difficulty of Paying Living Expenses: Not hard at all  Food Insecurity: No Food Insecurity (03/31/2018)   Hunger Vital Sign    Worried About Running Out of Food in the Last Year: Never true    Ran Out of Food in the Last Year: Never true  Transportation Needs: No Transportation Needs (03/31/2018)   PRAPARE - Administrator, Civil Service (Medical): No    Lack of Transportation (Non-Medical): No  Physical Activity: Inactive (03/31/2018)   Exercise Vital Sign    Days of Exercise per Week: 0 days    Minutes of Exercise per Session: 0 min  Stress: No Stress Concern Present (03/31/2018)   [employer] of Occupational Health - Occupational Stress Questionnaire    Feeling of Stress : Not at all  Social Connections: Somewhat Isolated (03/31/2018)   Social Connection and Isolation Panel [NHANES]    Frequency of Communication with Friends and Family: More than three times a week    Frequency of Social Gatherings with Friends and Family: Once a week    Attends Religious Services: 1 to 4 times per year    Active Member of [employer] or Organizations: No    Attends Banker Meetings: Never    Marital Status: Widowed  Intimate  Partner Violence: Not At Risk (03/31/2018)   Humiliation, Afraid, Rape, and Kick questionnaire    Fear of Current or Ex-Partner: No    Emotionally Abused: No    Physically Abused: No    Sexually Abused: No   Family History  Problem Relation Age of Onset   Diabetes Sister    Hypertension Sister       VITAL SIGNS BP (!) 150/73   Pulse (!) 50   Temp 98 F (36.7 C)   Resp 20   Ht 4\' 10"  (1.473 m)   Wt 161 lb 9.6 oz (73.3 kg)   SpO2 96%   BMI 33.77 kg/m   Outpatient Encounter Medications as of 05/02/2023  Medication Sig   acetaminophen (TYLENOL) 325 MG tablet Take 650 mg by mouth every 6 (six) hours as needed.   albuterol (PROVENTIL) (2.5 MG/3ML) 0.083%  nebulizer solution Take 2.5 mg by nebulization every 4 (four) hours as needed for wheezing or shortness of breath.   aspirin 81 MG chewable tablet Chew 81 mg by mouth daily.   NON FORMULARY Diet: Dysphagia 1 (puree) with thin liquids   polyethylene glycol (MIRALAX / GLYCOLAX) 17 g packet Take 17 g by mouth daily.   polyvinyl alcohol (LIQUIFILM TEARS) 1.4 % ophthalmic solution Place 1 drop into both eyes in the morning and at bedtime. Wait at least 5 minutes between multiple drops in same eye.   No facility-administered encounter medications on file as of 05/02/2023.     SIGNIFICANT DIAGNOSTIC EXAMS  PREVIOUS   05-02-22: right lower extremity doppler: acute DVT common femoral vein   NO NEW EXAMS    LABS REVIEWED:   04-30-22: wbc 5.3; hgb 10.0; hct 32.3; mcv 92.3 plt 201; glucose 96; bun 22; creat 0.87; k+ 3.8; na++ 139; ca 8.4; gfr 59; protein 5.6 albumin 2.7 05-03-22: iron 41; tibc 303; vitamin B 12: 238 06-05-22: wbc 3.2; hgb 12.2; hct 37.5; mcv 95.2 plt 82   07-19-22: wbc 4.8; hgb 12.8; hct 41.1; mcv 94.3 plt 161 09-16-22: wbc 5.1; hgb 12.4; hct 39.6; mcv 97.5 plt 125; glucose 88; bun 22; creat 1.17; k+ 3.4; na++ 142; ca 7.9; gfr 41 11-08-22: vitamin B 12: 256  01-25-23: albumin 3.0     NO NEW LABS      Review of Systems  Unable to perform ROS: Dementia   Physical Exam Constitutional:      General: She is not in acute distress.    Appearance: She is well-developed. She is not diaphoretic.  Neck:     Thyroid: No thyromegaly.  Cardiovascular:     Rate and Rhythm: Regular rhythm. Bradycardia present.     Pulses: Normal pulses.     Heart sounds: Normal heart sounds.  Pulmonary:     Effort: Pulmonary effort is normal. No respiratory distress.     Breath sounds: Normal breath sounds.  Abdominal:     General: Bowel sounds are normal. There is no distension.     Palpations: Abdomen is soft.     Tenderness: There is no abdominal tenderness.  Musculoskeletal:     Cervical back:  Neck supple.     Right lower leg: No edema.     Left lower leg: No edema.     Comments: Able to move all extremities    Lymphadenopathy:     Cervical: No cervical adenopathy.  Skin:    General: Skin is warm and dry.  Neurological:     Mental Status: She is alert. Mental status is  at baseline.  Psychiatric:        Mood and Affect: Mood normal.       ASSESSMENT/ PLAN:  TODAY  Aortic atherosclerosis  Chronic respiratory failure with hypoxia Severe late onset alzheimer's disease without behavioral disturbance, psychotic disturbance, mood disturbance, or anxiety.   Will continue current medications Will continue current plan of care Will continue to monitor her status.   Time spent with patient; 40 minutes: care plan; medications; dietary.    Synthia Innocent NP [facility]  call [phone]

## 2023-05-08 ENCOUNTER — Encounter: Payer: Self-pay | Admitting: Adult Health

## 2023-05-08 ENCOUNTER — Non-Acute Institutional Stay (SKILLED_NURSING_FACILITY): Payer: Self-pay | Admitting: Adult Health

## 2023-05-08 DIAGNOSIS — I10 Essential (primary) hypertension: Secondary | ICD-10-CM | POA: Diagnosis not present

## 2023-05-08 DIAGNOSIS — J9611 Chronic respiratory failure with hypoxia: Secondary | ICD-10-CM | POA: Diagnosis not present

## 2023-05-08 DIAGNOSIS — F02C0 Dementia in other diseases classified elsewhere, severe, without behavioral disturbance, psychotic disturbance, mood disturbance, and anxiety: Secondary | ICD-10-CM | POA: Diagnosis not present

## 2023-05-08 DIAGNOSIS — G301 Alzheimer's disease with late onset: Secondary | ICD-10-CM

## 2023-05-08 NOTE — Progress Notes (Signed)
Location:  [facility] Room Number: 149 D Place of Service:  SNF (31)   CODE STATUS: DNR  No Known Allergies  Chief Complaint  Patient presents with   Medical Management of Chronic Issues                 Chronic respiratory failure with hypoxia: Severe late onset alzheimer's disease without behavioral disturbance, psychotic disturbance; mood disturbance or anxiety: Essential hypertension     HPI:  She is a 89 year old long term resident of this facility being seen for the management of her chronic illnesses: Chronic respiratory failure with hypoxia: Severe late onset alzheimer's disease without behavioral disturbance, psychotic disturbance; mood disturbance or anxiety: Essential hypertension. There are no reports of uncontrolled pain; no changes in appetite; weight is without significant change.   Past Medical History:  Diagnosis Date   Allergic rhinitis 11/25/2016   Arthritis    mild, no known falls uses cane   Dementia (HCC)    Hypertension    Neurofibroma 05/29/2017   Right breast skin lesion- pathology neurofibroma     Past Surgical History:  Procedure Laterality Date   MASS EXCISION Right 06/07/2017   Procedure: EXCISION SKIN LESION OF RIGHT BREAST;  Surgeon: Lucretia Roers, MD;  Location: [facility];  Service: General;  Laterality: Right;    Social History   Socioeconomic History   Marital status: Widowed    Spouse name: Not on file   Number of children: 0   Years of education: Not on file   Highest education level: Not on file  Occupational History   Not on file  Tobacco Use   Smoking status: Never   Smokeless tobacco: Never  Vaping Use   Vaping status: Never Used  Substance and Sexual Activity   Alcohol use: No   Drug use: No   Sexual activity: Not Currently  Other Topics Concern   Not on file  Social History Narrative   Not on file   Social Determinants of Health   Financial Resource Strain: Low Risk  (03/31/2018)    Overall Financial Resource Strain (CARDIA)    Difficulty of Paying Living Expenses: Not hard at all  Food Insecurity: No Food Insecurity (03/31/2018)   Hunger Vital Sign    Worried About Running Out of Food in the Last Year: Never true    Ran Out of Food in the Last Year: Never true  Transportation Needs: No Transportation Needs (03/31/2018)   PRAPARE - Administrator, Civil Service (Medical): No    Lack of Transportation (Non-Medical): No  Physical Activity: Inactive (03/31/2018)   Exercise Vital Sign    Days of Exercise per Week: 0 days    Minutes of Exercise per Session: 0 min  Stress: No Stress Concern Present (03/31/2018)   [employer] of Occupational Health - Occupational Stress Questionnaire    Feeling of Stress : Not at all  Social Connections: Somewhat Isolated (03/31/2018)   Social Connection and Isolation Panel [NHANES]    Frequency of Communication with Friends and Family: More than three times a week    Frequency of Social Gatherings with Friends and Family: Once a week    Attends Religious Services: 1 to 4 times per year    Active Member of [employer] or Organizations: No    Attends Banker Meetings: Never    Marital Status: Widowed  Intimate Partner Violence: Not At Risk (03/31/2018)   Humiliation, Afraid, Rape, and Kick questionnaire  Fear of Current or Ex-Partner: No    Emotionally Abused: No    Physically Abused: No    Sexually Abused: No   Family History  Problem Relation Age of Onset   Diabetes Sister    Hypertension Sister       VITAL SIGNS BP 127/67   Pulse (!) 58   Temp (!) 97.3 F (36.3 C)   Ht 5\' 2"  (1.575 m)   Wt 161 lb 9.6 oz (73.3 kg)   SpO2 97%   BMI 29.56 kg/m   Outpatient Encounter Medications as of 05/08/2023  Medication Sig   acetaminophen (TYLENOL) 325 MG tablet Take 650 mg by mouth every 6 (six) hours as needed.   albuterol (PROVENTIL) (2.5 MG/3ML) 0.083% nebulizer solution Take 2.5 mg by nebulization every 4  (four) hours as needed for wheezing or shortness of breath.   NON FORMULARY Diet: Dysphagia 1 (puree) with thin liquids   polyethylene glycol (MIRALAX / GLYCOLAX) 17 g packet Take 17 g by mouth daily.   polyvinyl alcohol (LIQUIFILM TEARS) 1.4 % ophthalmic solution Place 1 drop into both eyes in the morning and at bedtime. Wait at least 5 minutes between multiple drops in same eye.   [DISCONTINUED] aspirin 81 MG chewable tablet Chew 81 mg by mouth daily.   No facility-administered encounter medications on file as of 05/08/2023.     SIGNIFICANT DIAGNOSTIC EXAMS   PREVIOUS   05-02-22: right lower extremity doppler: acute DVT common femoral vein   NO NEW EXAMS    LABS REVIEWED:   04-30-22: wbc 5.3; hgb 10.0; hct 32.3; mcv 92.3 plt 201; glucose 96; bun 22; creat 0.87; k+ 3.8; na++ 139; ca 8.4; gfr 59; protein 5.6 albumin 2.7 05-03-22: iron 41; tibc 303; vitamin B 12: 238 06-05-22: wbc 3.2; hgb 12.2; hct 37.5; mcv 95.2 plt 82   07-19-22: wbc 4.8; hgb 12.8; hct 41.1; mcv 94.3 plt 161 09-16-22: wbc 5.1; hgb 12.4; hct 39.6; mcv 97.5 plt 125; glucose 88; bun 22; creat 1.17; k+ 3.4; na++ 142; ca 7.9; gfr 41 11-08-22: vitamin B 12: 256  01-25-23: albumin 3.0     TODAY  04-18-23: wbc 5.0; hgb 12.6; hct 39.4 mcv 96.6 plt 153; glucose 87; bun 18; creat 0.91; k+ 3.7; na++ 139; ca 8.4 gfr 55; protein 5.5 albumin 2.9    Review of Systems  Unable to perform ROS: Dementia   Physical Exam Constitutional:      General: She is not in acute distress.    Appearance: She is well-developed. She is not diaphoretic.  Neck:     Thyroid: No thyromegaly.  Cardiovascular:     Rate and Rhythm: Regular rhythm. Bradycardia present.     Heart sounds: Normal heart sounds.  Pulmonary:     Effort: Pulmonary effort is normal. No respiratory distress.     Breath sounds: Normal breath sounds.  Abdominal:     General: Bowel sounds are normal. There is no distension.     Palpations: Abdomen is soft.     Tenderness:  There is no abdominal tenderness.  Musculoskeletal:     Cervical back: Neck supple.     Right lower leg: No edema.     Left lower leg: No edema.     Comments: Able to move all extremities     Lymphadenopathy:     Cervical: No cervical adenopathy.  Skin:    General: Skin is warm and dry.  Neurological:     Mental Status: She is alert. Mental status  is at baseline.  Psychiatric:        Mood and Affect: Mood normal.      ASSESSMENT/ PLAN:  TODAY   Chronic respiratory failure with hypoxia: will monitor   2. Severe late onset alzheimer's disease without behavioral disturbance, psychotic disturbance; mood disturbance or anxiety: weight is 161 pounds will monitor   3. Essential hypertension: b/p 127/67  PREVIOUS   4. Severe protein calorie malnutrition: protein 5.5 albumin 2.9 will monitor  5. Anemia due to CKD stage 3: hgb 12.6 is off iron  6. Aortic atherosclerosis (ct 11-16-17); not on statin due to advanced age  58. Thrombocytopenia: plt 153 will monitor   8. Chronic constipation: will continue miralax daily   9. Hyperlipidemia LDL goal <130: is off statin due to her advanced age   10. Chronic kidney disease stage 3a: bun 18; creat 0.91 gfr 55     Synthia Innocent NP [facility]  call [phone]

## 2023-05-16 DIAGNOSIS — Z515 Encounter for palliative care: Secondary | ICD-10-CM | POA: Diagnosis not present

## 2023-05-16 DIAGNOSIS — G301 Alzheimer's disease with late onset: Secondary | ICD-10-CM | POA: Diagnosis not present

## 2023-05-24 ENCOUNTER — Encounter: Payer: Self-pay | Admitting: Internal Medicine

## 2023-05-24 ENCOUNTER — Non-Acute Institutional Stay (SKILLED_NURSING_FACILITY): Payer: Self-pay | Admitting: Internal Medicine

## 2023-05-24 DIAGNOSIS — D649 Anemia, unspecified: Secondary | ICD-10-CM

## 2023-05-24 DIAGNOSIS — E43 Unspecified severe protein-calorie malnutrition: Secondary | ICD-10-CM

## 2023-05-24 DIAGNOSIS — F0150 Vascular dementia without behavioral disturbance: Secondary | ICD-10-CM | POA: Insufficient documentation

## 2023-05-24 DIAGNOSIS — D696 Thrombocytopenia, unspecified: Secondary | ICD-10-CM | POA: Diagnosis not present

## 2023-05-24 NOTE — Assessment & Plan Note (Addendum)
She could not give me her age.Confabulates about going to see her mother as soon as she can. She identified a box of Crayolas as the container from which she got her stick of gum.

## 2023-05-24 NOTE — Assessment & Plan Note (Signed)
Albumin has decreased slightly and is currently 2.9 with a total protein of 5.5.  Nutritionist at SNF to follow.

## 2023-05-24 NOTE — Assessment & Plan Note (Signed)
Current CBC is normal with resolution of the anemia.

## 2023-05-24 NOTE — Progress Notes (Unsigned)
NURSING HOME LOCATION:  [facility] ROOM NUMBER:  [id number] D  CODE STATUS: DNR   PCP:  Synthia Innocent NP  This is a nursing facility follow up visit of chronic medical diagnoses &  to document compliance with Regulation 483.30 (c) in The Long Term Care Survey Manual Phase 2 which mandates caregiver visit ( visits can alternate among physician, PA or NP as per statutes) within 10 days of 30 days / 60 days/ 90 days post admission to SNF date    Interim medical record and care since last SNF visit was updated with review of diagnostic studies and change in clinical status since last visit were documented.  HPI: She is a permanent resident of this facility with medical diagnoses of allergic rhinitis, essential hypertension, history of neurofibroma, aortic atherosclerosis, chronic respiratory failure with hypoxia, dyslipidemia, history of normocytic anemia, and dementia. Labs were completed 04/18/2023 and revealed improvement in her hypocalcemia with a current value of 8.4, up from 7.9.  Albumin has dropped slightly and is now 2.9 and total protein 5.5 indicating protein/caloric malnutrition.  There is been significant improvement in her renal function as creatinine has dropped from 1.17 to current values of 0.91.  GFR has risen from 41 up to 55 indicating stage IIIa CKD.  CBC was normal.  Thrombocytopenia has resolved with current platelet count of 153,000.  Review of systems: Dementia invalidated responses.  She could not comprehend why I was seeing her.  Review of systems was totally negative but she exhibited confabulation.  She made statements like "I have always been a fighter."  She then stated "I am older than you are."  When I asked her how she was she could not tell me.  She thought that she was born in 1919 which is correct.  She stated that she was waiting to meet her mother as soon as she could.   Constitutional: No fever, significant weight change, fatigue  Eyes: No  redness, discharge, pain, vision change ENT/mouth: No nasal congestion,  purulent discharge, earache, change in hearing, sore throat  Cardiovascular: No chest pain, palpitations, paroxysmal nocturnal dyspnea, claudication, edema  Respiratory: No cough, sputum production, hemoptysis, DOE, significant snoring, apnea   Gastrointestinal: No heartburn, dysphagia, abdominal pain, nausea /vomiting, rectal bleeding, melena, change in bowels Genitourinary: No dysuria, hematuria, pyuria, incontinence, nocturia Musculoskeletal: No joint stiffness, joint swelling, weakness, pain Dermatologic: No rash, pruritus, change in appearance of skin Neurologic: No dizziness, headache, syncope, seizures, numbness, tingling Psychiatric: No significant anxiety, depression, insomnia, anorexia Endocrine: No change in hair/skin/nails, excessive thirst, excessive hunger, excessive urination  Hematologic/lymphatic: No significant bruising, lymphadenopathy, abnormal bleeding Allergy/immunology: No itchy/watery eyes, significant sneezing, urticaria, angioedema  Physical exam:  Pertinent or positive findings: She appears younger than her stated age.  She is hard of hearing.  Facies are blank.  She has bilateral ptosis.  There is a small polyp over the right upper lid in the eyebrow area.  The lacrimal glands are prominent.  There is a brief systolic murmur at the base with increased second heart sound.  Abdomen is protuberant.  Pedal pulses are not palpable.  She has a trace edema at the sock line.  Interosseous wasting of the hands is present. General appearance: Adequately nourished; no acute distress, increased work of breathing is present.   Lymphatic: No lymphadenopathy about the head, neck, axilla. Eyes: No conjunctival inflammation or lid edema is present. There is no scleral icterus. Ears:  External ear exam shows no significant lesions  or deformities.   Nose:  External nasal examination shows no deformity or  inflammation. Nasal mucosa are pink and moist without lesions, exudates Oral exam:  Lips and gums are healthy appearing. There is no oropharyngeal erythema or exudate. Neck:  No thyromegaly, masses, tenderness noted.    Heart:  Normal rate and regular rhythm. S1 and S2 normal without gallop, murmur, click, rub .  Lungs: Chest clear to auscultation without wheezes, rhonchi, rales, rubs. Abdomen: Bowel sounds are normal. Abdomen is soft and nontender with no organomegaly, hernias, masses. GU: Deferred  Extremities:  No cyanosis, clubbing, edema  Neurologic exam : Cn 2-7 intact Strength equal  in upper & lower extremities Balance, Rhomberg, finger to nose testing could not be completed due to clinical state Deep tendon reflexes are equal Skin: Warm & dry w/o tenting. No significant lesions or rash.  See summary under each active problem in the Problem List with associated updated therapeutic plan

## 2023-05-24 NOTE — Patient Instructions (Signed)
See assessment and plan under each diagnosis in the problem list and acutely for this visit 

## 2023-05-24 NOTE — Assessment & Plan Note (Signed)
Thrombocytopenia has resolved with current platelet count 153,000.

## 2023-05-28 DIAGNOSIS — L602 Onychogryphosis: Secondary | ICD-10-CM | POA: Diagnosis not present

## 2023-05-28 DIAGNOSIS — L84 Corns and callosities: Secondary | ICD-10-CM | POA: Diagnosis not present

## 2023-05-28 DIAGNOSIS — Z23 Encounter for immunization: Secondary | ICD-10-CM | POA: Diagnosis not present

## 2023-05-28 DIAGNOSIS — I739 Peripheral vascular disease, unspecified: Secondary | ICD-10-CM | POA: Diagnosis not present

## 2023-05-28 DIAGNOSIS — L603 Nail dystrophy: Secondary | ICD-10-CM | POA: Diagnosis not present

## 2023-06-17 DIAGNOSIS — Z23 Encounter for immunization: Secondary | ICD-10-CM | POA: Diagnosis not present

## 2023-06-20 DIAGNOSIS — R451 Restlessness and agitation: Secondary | ICD-10-CM | POA: Diagnosis not present

## 2023-06-20 DIAGNOSIS — R600 Localized edema: Secondary | ICD-10-CM | POA: Diagnosis not present

## 2023-06-20 DIAGNOSIS — Z515 Encounter for palliative care: Secondary | ICD-10-CM | POA: Diagnosis not present

## 2023-06-20 DIAGNOSIS — G301 Alzheimer's disease with late onset: Secondary | ICD-10-CM | POA: Diagnosis not present

## 2023-07-10 ENCOUNTER — Non-Acute Institutional Stay (SKILLED_NURSING_FACILITY): Payer: Medicare Other | Admitting: Adult Health

## 2023-07-10 DIAGNOSIS — D649 Anemia, unspecified: Secondary | ICD-10-CM

## 2023-07-10 DIAGNOSIS — I700 Atherosclerosis of aorta: Secondary | ICD-10-CM

## 2023-07-10 DIAGNOSIS — E43 Unspecified severe protein-calorie malnutrition: Secondary | ICD-10-CM

## 2023-07-11 ENCOUNTER — Encounter: Payer: Self-pay | Admitting: Adult Health

## 2023-07-11 NOTE — Progress Notes (Signed)
Location:  [facility] Room Number: 149 Place of Service:  SNF (31)   CODE STATUS: dnr   No Known Allergies  Chief Complaint  Patient presents with   Medical Management of Chronic Issues          Severe protein calorie malnutrition:  Anemia due to CKD stage 3   Aortic atherosclerosis     HPI:  She is a 89 year old long term resident of this facility being seen for the management of her chronic illnesses: Severe protein calorie malnutrition:  Anemia due to CKD stage 3   Aortic atherosclerosis. There are no reports of uncontrolled pain present. Her weight is stable. She continues to get out of bed daily   Past Medical History:  Diagnosis Date   Allergic rhinitis 11/25/2016   Arthritis    mild, no known falls uses cane   Dementia (HCC)    Hypertension    Neurofibroma 05/29/2017   Right breast skin lesion- pathology neurofibroma     Past Surgical History:  Procedure Laterality Date   MASS EXCISION Right 06/07/2017   Procedure: EXCISION SKIN LESION OF RIGHT BREAST;  Surgeon: Lucretia Roers, MD;  Location: [facility];  Service: General;  Laterality: Right;    Social History   Socioeconomic History   Marital status: Widowed    Spouse name: Not on file   Number of children: 0   Years of education: Not on file   Highest education level: Not on file  Occupational History   Not on file  Tobacco Use   Smoking status: Never   Smokeless tobacco: Never  Vaping Use   Vaping status: Never Used  Substance and Sexual Activity   Alcohol use: No   Drug use: No   Sexual activity: Not Currently  Other Topics Concern   Not on file  Social History Narrative   Not on file   Social Determinants of Health   Financial Resource Strain: Low Risk  (03/31/2018)   Overall Financial Resource Strain (CARDIA)    Difficulty of Paying Living Expenses: Not hard at all  Food Insecurity: No Food Insecurity (03/31/2018)   Hunger Vital Sign    Worried About Running Out  of Food in the Last Year: Never true    Ran Out of Food in the Last Year: Never true  Transportation Needs: No Transportation Needs (03/31/2018)   PRAPARE - Administrator, Civil Service (Medical): No    Lack of Transportation (Non-Medical): No  Physical Activity: Inactive (03/31/2018)   Exercise Vital Sign    Days of Exercise per Week: 0 days    Minutes of Exercise per Session: 0 min  Stress: No Stress Concern Present (03/31/2018)   [employer] of Occupational Health - Occupational Stress Questionnaire    Feeling of Stress : Not at all  Social Connections: Somewhat Isolated (03/31/2018)   Social Connection and Isolation Panel [NHANES]    Frequency of Communication with Friends and Family: More than three times a week    Frequency of Social Gatherings with Friends and Family: Once a week    Attends Religious Services: 1 to 4 times per year    Active Member of [employer] or Organizations: No    Attends Banker Meetings: Never    Marital Status: Widowed  Intimate Partner Violence: Not At Risk (03/31/2018)   Humiliation, Afraid, Rape, and Kick questionnaire    Fear of Current or Ex-Partner: No    Emotionally Abused:  No    Physically Abused: No    Sexually Abused: No   Family History  Problem Relation Age of Onset   Diabetes Sister    Hypertension Sister       VITAL SIGNS BP (!) 148/71   Pulse (!) 55   Temp (!) 97.3 F (36.3 C)   Resp 20   Ht 5\' 2"  (1.575 m)   Wt 161 lb 6.4 oz (73.2 kg)   SpO2 97%   BMI 29.52 kg/m   Outpatient Encounter Medications as of 07/10/2023  Medication Sig   acetaminophen (TYLENOL) 325 MG tablet Take 650 mg by mouth every 6 (six) hours as needed.   albuterol (PROVENTIL) (2.5 MG/3ML) 0.083% nebulizer solution Take 2.5 mg by nebulization every 4 (four) hours as needed for wheezing or shortness of breath.   NON FORMULARY Diet: Dysphagia 1 (puree) with thin liquids   polyethylene glycol (MIRALAX / GLYCOLAX) 17 g packet Take 17  g by mouth daily.   polyvinyl alcohol (LIQUIFILM TEARS) 1.4 % ophthalmic solution Place 1 drop into both eyes in the morning and at bedtime. Wait at least 5 minutes between multiple drops in same eye.   No facility-administered encounter medications on file as of 07/10/2023.     SIGNIFICANT DIAGNOSTIC EXAMS  PREVIOUS   05-02-22: right lower extremity doppler: acute DVT common femoral vein   NO NEW EXAMS    LABS REVIEWED:     07-19-22: wbc 4.8; hgb 12.8; hct 41.1; mcv 94.3 plt 161 09-16-22: wbc 5.1; hgb 12.4; hct 39.6; mcv 97.5 plt 125; glucose 88; bun 22; creat 1.17; k+ 3.4; na++ 142; ca 7.9; gfr 41 11-08-22: vitamin B 12: 256  01-25-23: albumin 3.0    04-18-23: wbc 5.0; hgb 12.6; hct 39.4 mcv 96.6 plt 153; glucose 87; bun 18; creat 0.91; k+ 3.7; na++ 139; ca 8.4 gfr 55; protein 5.5 albumin 2.9  NO NEW LABS.    Review of Systems  Unable to perform ROS: Dementia   Physical Exam Constitutional:      General: She is not in acute distress.    Appearance: She is well-developed. She is not diaphoretic.  Neck:     Thyroid: No thyromegaly.  Cardiovascular:     Rate and Rhythm: Regular rhythm. Bradycardia present.     Pulses: Normal pulses.     Heart sounds: Normal heart sounds.  Pulmonary:     Effort: Pulmonary effort is normal. No respiratory distress.     Breath sounds: Normal breath sounds.  Abdominal:     General: Bowel sounds are normal. There is no distension.     Palpations: Abdomen is soft.     Tenderness: There is no abdominal tenderness.  Musculoskeletal:     Cervical back: Neck supple.     Right lower leg: No edema.     Left lower leg: No edema.     Comments:  Able to move all extremities      Lymphadenopathy:     Cervical: No cervical adenopathy.  Skin:    General: Skin is warm and dry.  Neurological:     Mental Status: She is alert. Mental status is at baseline.  Psychiatric:        Mood and Affect: Mood normal.     ASSESSMENT/ PLAN:  TODAY   Severe  protein calorie malnutrition: protein 5.5 albumin 2.9; will monitor   2. Anemia due to CKD stage 3 hgb 12.6 is off iron  3. Aortic atherosclerosis (ct 11-16-17) not on statin  due to advanced age.   PREVIOUS   4. Thrombocytopenia: plt 153 will monitor   5. Chronic constipation: will continue miralax daily   6. Hyperlipidemia LDL goal <130: is off statin due to her advanced age   92. Chronic kidney disease stage 3a: bun 18; creat 0.91 gfr 55  8. Chronic respiratory failure with hypoxia: will monitor   9. Severe late onset alzheimer's disease without behavioral disturbance, psychotic disturbance; mood disturbance or anxiety: weight is 161 pounds will monitor   10. Essential hypertension: b/p 148/71    Synthia Innocent NP [facility]   call [phone]

## 2023-07-16 ENCOUNTER — Encounter: Payer: Self-pay | Admitting: Adult Health

## 2023-07-16 NOTE — Progress Notes (Unsigned)
Location:  [facility] Room Number: 149 Place of Service:  SNF (31)   CODE STATUS: ***  No Known Allergies  Chief Complaint  Patient presents with   Acute Visit    Care plan meeting     HPI:    Past Medical History:  Diagnosis Date   Allergic rhinitis 11/25/2016   Arthritis    mild, no known falls uses cane   Dementia (HCC)    Hypertension    Neurofibroma 05/29/2017   Right breast skin lesion- pathology neurofibroma     Past Surgical History:  Procedure Laterality Date   MASS EXCISION Right 06/07/2017   Procedure: EXCISION SKIN LESION OF RIGHT BREAST;  Surgeon: Lucretia Roers, MD;  Location: [facility];  Service: General;  Laterality: Right;    Social History   Socioeconomic History   Marital status: Widowed    Spouse name: Not on file   Number of children: 0   Years of education: Not on file   Highest education level: Not on file  Occupational History   Not on file  Tobacco Use   Smoking status: Never   Smokeless tobacco: Never  Vaping Use   Vaping status: Never Used  Substance and Sexual Activity   Alcohol use: No   Drug use: No   Sexual activity: Not Currently  Other Topics Concern   Not on file  Social History Narrative   Not on file   Social Determinants of Health   Financial Resource Strain: Low Risk  (03/31/2018)   Overall Financial Resource Strain (CARDIA)    Difficulty of Paying Living Expenses: Not hard at all  Food Insecurity: No Food Insecurity (03/31/2018)   Hunger Vital Sign    Worried About Running Out of Food in the Last Year: Never true    Ran Out of Food in the Last Year: Never true  Transportation Needs: No Transportation Needs (03/31/2018)   PRAPARE - Administrator, Civil Service (Medical): No    Lack of Transportation (Non-Medical): No  Physical Activity: Inactive (03/31/2018)   Exercise Vital Sign    Days of Exercise per Week: 0 days    Minutes of Exercise per Session: 0 min  Stress: No  Stress Concern Present (03/31/2018)   [employer] of Occupational Health - Occupational Stress Questionnaire    Feeling of Stress : Not at all  Social Connections: Somewhat Isolated (03/31/2018)   Social Connection and Isolation Panel [NHANES]    Frequency of Communication with Friends and Family: More than three times a week    Frequency of Social Gatherings with Friends and Family: Once a week    Attends Religious Services: 1 to 4 times per year    Active Member of [employer] or Organizations: No    Attends Banker Meetings: Never    Marital Status: Widowed  Intimate Partner Violence: Not At Risk (03/31/2018)   Humiliation, Afraid, Rape, and Kick questionnaire    Fear of Current or Ex-Partner: No    Emotionally Abused: No    Physically Abused: No    Sexually Abused: No   Family History  Problem Relation Age of Onset   Diabetes Sister    Hypertension Sister       VITAL SIGNS BP (!) 149/70   Pulse 70   Temp 97.6 F (36.4 C)   Resp 18   Ht 5\' 2"  (1.575 m)   Wt 161 lb 6.4 oz (73.2 kg)   SpO2 97%  BMI 29.52 kg/m   Outpatient Encounter Medications as of 07/17/2023  Medication Sig   acetaminophen (TYLENOL) 325 MG tablet Take 650 mg by mouth every 6 (six) hours as needed.   albuterol (PROVENTIL) (2.5 MG/3ML) 0.083% nebulizer solution Take 2.5 mg by nebulization every 4 (four) hours as needed for wheezing or shortness of breath.   NON FORMULARY Diet: Dysphagia 1 (puree) with thin liquids   polyethylene glycol (MIRALAX / GLYCOLAX) 17 g packet Take 17 g by mouth daily.   polyvinyl alcohol (LIQUIFILM TEARS) 1.4 % ophthalmic solution Place 1 drop into both eyes in the morning and at bedtime. Wait at least 5 minutes between multiple drops in same eye.   No facility-administered encounter medications on file as of 07/17/2023.     SIGNIFICANT DIAGNOSTIC EXAMS       ASSESSMENT/ PLAN:     Synthia Innocent NP [facility]  call [phone]

## 2023-07-17 ENCOUNTER — Non-Acute Institutional Stay (SKILLED_NURSING_FACILITY): Payer: Medicare Other | Admitting: Adult Health

## 2023-07-17 ENCOUNTER — Encounter: Payer: Self-pay | Admitting: Adult Health

## 2023-07-17 DIAGNOSIS — J9611 Chronic respiratory failure with hypoxia: Secondary | ICD-10-CM | POA: Diagnosis not present

## 2023-07-17 DIAGNOSIS — F02C0 Dementia in other diseases classified elsewhere, severe, without behavioral disturbance, psychotic disturbance, mood disturbance, and anxiety: Secondary | ICD-10-CM | POA: Diagnosis not present

## 2023-07-17 DIAGNOSIS — G301 Alzheimer's disease with late onset: Secondary | ICD-10-CM | POA: Diagnosis not present

## 2023-07-17 DIAGNOSIS — I700 Atherosclerosis of aorta: Secondary | ICD-10-CM | POA: Diagnosis not present

## 2023-07-17 NOTE — Progress Notes (Signed)
Location:  [facility] Room Number: 149 Place of Service:  SNF (31)   CODE STATUS: dnr   No Known Allergies  Chief Complaint  Patient presents with   Acute Visit    Care plan meeting     HPI:  We have come together for her care plan meeting. Marland Kitchen BIMS 5/15; mood 0/30. Uses wheelchair with one fall without injury. She requires moderate to maximum assist with her adl care. Dietary: puree D1; weight is 161.4 pounds setup for meals; appetite 51-100%. Therapy: none at this time. She will continue to be followed for her chronic illnesses including:   Aortic atherosclerosis  Chronic respiratory failure with hypoxia   Severe late onset alzheimer's disease without behavioral disturbance, psychotic disturbance, mood disturbance or anxiety  Past Medical History:  Diagnosis Date   Allergic rhinitis 11/25/2016   Arthritis    mild, no known falls uses cane   Dementia (HCC)    Hypertension    Neurofibroma 05/29/2017   Right breast skin lesion- pathology neurofibroma     Past Surgical History:  Procedure Laterality Date   MASS EXCISION Right 06/07/2017   Procedure: EXCISION SKIN LESION OF RIGHT BREAST;  Surgeon: Lucretia Roers, MD;  Location: [facility];  Service: General;  Laterality: Right;    Social History   Socioeconomic History   Marital status: Widowed    Spouse name: Not on file   Number of children: 0   Years of education: Not on file   Highest education level: Not on file  Occupational History   Not on file  Tobacco Use   Smoking status: Never   Smokeless tobacco: Never  Vaping Use   Vaping status: Never Used  Substance and Sexual Activity   Alcohol use: No   Drug use: No   Sexual activity: Not Currently  Other Topics Concern   Not on file  Social History Narrative   Not on file   Social Determinants of Health   Financial Resource Strain: Low Risk  (03/31/2018)   Overall Financial Resource Strain (CARDIA)    Difficulty of Paying Living  Expenses: Not hard at all  Food Insecurity: No Food Insecurity (03/31/2018)   Hunger Vital Sign    Worried About Running Out of Food in the Last Year: Never true    Ran Out of Food in the Last Year: Never true  Transportation Needs: No Transportation Needs (03/31/2018)   PRAPARE - Administrator, Civil Service (Medical): No    Lack of Transportation (Non-Medical): No  Physical Activity: Inactive (03/31/2018)   Exercise Vital Sign    Days of Exercise per Week: 0 days    Minutes of Exercise per Session: 0 min  Stress: No Stress Concern Present (03/31/2018)   [employer] of Occupational Health - Occupational Stress Questionnaire    Feeling of Stress : Not at all  Social Connections: Somewhat Isolated (03/31/2018)   Social Connection and Isolation Panel [NHANES]    Frequency of Communication with Friends and Family: More than three times a week    Frequency of Social Gatherings with Friends and Family: Once a week    Attends Religious Services: 1 to 4 times per year    Active Member of [employer] or Organizations: No    Attends Banker Meetings: Never    Marital Status: Widowed  Intimate Partner Violence: Not At Risk (03/31/2018)   Humiliation, Afraid, Rape, and Kick questionnaire    Fear of Current or  Ex-Partner: No    Emotionally Abused: No    Physically Abused: No    Sexually Abused: No   Family History  Problem Relation Age of Onset   Diabetes Sister    Hypertension Sister       VITAL SIGNS BP (!) 149/70   Pulse (!) 52   Temp 97.6 F (36.4 C)   Resp 20   Ht 5\' 2"  (1.575 m)   Wt 161 lb 6.4 oz (73.2 kg)   SpO2 97%   BMI 29.52 kg/m   Outpatient Encounter Medications as of 07/17/2023  Medication Sig   acetaminophen (TYLENOL) 325 MG tablet Take 650 mg by mouth every 6 (six) hours as needed.   albuterol (PROVENTIL) (2.5 MG/3ML) 0.083% nebulizer solution Take 2.5 mg by nebulization every 4 (four) hours as needed for wheezing or shortness of breath.    NON FORMULARY Diet: Dysphagia 1 (puree) with thin liquids   polyethylene glycol (MIRALAX / GLYCOLAX) 17 g packet Take 17 g by mouth daily.   polyvinyl alcohol (LIQUIFILM TEARS) 1.4 % ophthalmic solution Place 1 drop into both eyes in the morning and at bedtime. Wait at least 5 minutes between multiple drops in same eye.   No facility-administered encounter medications on file as of 07/17/2023.     SIGNIFICANT DIAGNOSTIC EXAMS  LABS REVIEWED:     07-19-22: wbc 4.8; hgb 12.8; hct 41.1; mcv 94.3 plt 161 09-16-22: wbc 5.1; hgb 12.4; hct 39.6; mcv 97.5 plt 125; glucose 88; bun 22; creat 1.17; k+ 3.4; na++ 142; ca 7.9; gfr 41 11-08-22: vitamin B 12: 256  01-25-23: albumin 3.0    04-18-23: wbc 5.0; hgb 12.6; hct 39.4 mcv 96.6 plt 153; glucose 87; bun 18; creat 0.91; k+ 3.7; na++ 139; ca 8.4 gfr 55; protein 5.5 albumin 2.9  NO NEW LABS.    Review of Systems  Constitutional:  Negative for malaise/fatigue.  Respiratory:  Negative for cough and shortness of breath.   Cardiovascular:  Negative for chest pain, palpitations and leg swelling.  Gastrointestinal:  Negative for abdominal pain, constipation and heartburn.  Musculoskeletal:  Negative for back pain, joint pain and myalgias.  Skin: Negative.   Neurological:  Negative for dizziness.  Psychiatric/Behavioral:  The patient is not nervous/anxious.    Physical Exam Constitutional:      General: She is not in acute distress.    Appearance: She is well-developed. She is not diaphoretic.  Neck:     Thyroid: No thyromegaly.  Cardiovascular:     Rate and Rhythm: Regular rhythm. Bradycardia present.     Pulses: Normal pulses.     Heart sounds: Normal heart sounds.  Pulmonary:     Effort: Pulmonary effort is normal. No respiratory distress.     Breath sounds: Normal breath sounds.  Abdominal:     General: Bowel sounds are normal. There is no distension.     Palpations: Abdomen is soft.     Tenderness: There is no abdominal tenderness.   Musculoskeletal:     Cervical back: Neck supple.     Right lower leg: No edema.     Left lower leg: No edema.     Comments: Able to move all extremities   Lymphadenopathy:     Cervical: No cervical adenopathy.  Skin:    General: Skin is warm and dry.  Neurological:     Mental Status: She is alert. Mental status is at baseline.  Psychiatric:        Mood and Affect: Mood normal.  ASSESSMENT/ PLAN:  TODAY  Aortic atherosclerosis Chronic respiratory failure with hypoxia Severe late onset alzheimer's disease without behavioral disturbance, psychotic disturbance, mood disturbance or anxiety   Will continue current medications Will continue current plan of care Will continue to monitor her status.   Time spent with patient: 40 minutes: medications; plan of care dietary.    Synthia Innocent NP [facility]   call [phone]

## 2023-07-22 DIAGNOSIS — F02C0 Dementia in other diseases classified elsewhere, severe, without behavioral disturbance, psychotic disturbance, mood disturbance, and anxiety: Secondary | ICD-10-CM | POA: Diagnosis not present

## 2023-07-22 DIAGNOSIS — R1312 Dysphagia, oropharyngeal phase: Secondary | ICD-10-CM | POA: Diagnosis not present

## 2023-07-22 DIAGNOSIS — R488 Other symbolic dysfunctions: Secondary | ICD-10-CM | POA: Diagnosis not present

## 2023-07-22 DIAGNOSIS — G301 Alzheimer's disease with late onset: Secondary | ICD-10-CM | POA: Diagnosis not present

## 2023-07-23 DIAGNOSIS — R488 Other symbolic dysfunctions: Secondary | ICD-10-CM | POA: Diagnosis not present

## 2023-07-23 DIAGNOSIS — G301 Alzheimer's disease with late onset: Secondary | ICD-10-CM | POA: Diagnosis not present

## 2023-07-23 DIAGNOSIS — R1312 Dysphagia, oropharyngeal phase: Secondary | ICD-10-CM | POA: Diagnosis not present

## 2023-07-23 DIAGNOSIS — F02C0 Dementia in other diseases classified elsewhere, severe, without behavioral disturbance, psychotic disturbance, mood disturbance, and anxiety: Secondary | ICD-10-CM | POA: Diagnosis not present

## 2023-07-24 DIAGNOSIS — G301 Alzheimer's disease with late onset: Secondary | ICD-10-CM | POA: Diagnosis not present

## 2023-07-24 DIAGNOSIS — R488 Other symbolic dysfunctions: Secondary | ICD-10-CM | POA: Diagnosis not present

## 2023-07-24 DIAGNOSIS — R1312 Dysphagia, oropharyngeal phase: Secondary | ICD-10-CM | POA: Diagnosis not present

## 2023-07-24 DIAGNOSIS — F02C0 Dementia in other diseases classified elsewhere, severe, without behavioral disturbance, psychotic disturbance, mood disturbance, and anxiety: Secondary | ICD-10-CM | POA: Diagnosis not present

## 2023-07-25 DIAGNOSIS — R001 Bradycardia, unspecified: Secondary | ICD-10-CM | POA: Diagnosis not present

## 2023-07-25 DIAGNOSIS — G301 Alzheimer's disease with late onset: Secondary | ICD-10-CM | POA: Diagnosis not present

## 2023-07-25 DIAGNOSIS — R1312 Dysphagia, oropharyngeal phase: Secondary | ICD-10-CM | POA: Diagnosis not present

## 2023-07-25 DIAGNOSIS — R488 Other symbolic dysfunctions: Secondary | ICD-10-CM | POA: Diagnosis not present

## 2023-07-25 DIAGNOSIS — F02C0 Dementia in other diseases classified elsewhere, severe, without behavioral disturbance, psychotic disturbance, mood disturbance, and anxiety: Secondary | ICD-10-CM | POA: Diagnosis not present

## 2023-07-25 DIAGNOSIS — Z515 Encounter for palliative care: Secondary | ICD-10-CM | POA: Diagnosis not present

## 2023-07-25 DIAGNOSIS — F02818 Dementia in other diseases classified elsewhere, unspecified severity, with other behavioral disturbance: Secondary | ICD-10-CM | POA: Diagnosis not present

## 2023-07-25 DIAGNOSIS — R1310 Dysphagia, unspecified: Secondary | ICD-10-CM | POA: Diagnosis not present

## 2023-07-26 DIAGNOSIS — G301 Alzheimer's disease with late onset: Secondary | ICD-10-CM | POA: Diagnosis not present

## 2023-07-26 DIAGNOSIS — F02C0 Dementia in other diseases classified elsewhere, severe, without behavioral disturbance, psychotic disturbance, mood disturbance, and anxiety: Secondary | ICD-10-CM | POA: Diagnosis not present

## 2023-07-26 DIAGNOSIS — R488 Other symbolic dysfunctions: Secondary | ICD-10-CM | POA: Diagnosis not present

## 2023-07-26 DIAGNOSIS — R1312 Dysphagia, oropharyngeal phase: Secondary | ICD-10-CM | POA: Diagnosis not present

## 2023-07-29 DIAGNOSIS — G301 Alzheimer's disease with late onset: Secondary | ICD-10-CM | POA: Diagnosis not present

## 2023-07-29 DIAGNOSIS — R1312 Dysphagia, oropharyngeal phase: Secondary | ICD-10-CM | POA: Diagnosis not present

## 2023-07-29 DIAGNOSIS — R488 Other symbolic dysfunctions: Secondary | ICD-10-CM | POA: Diagnosis not present

## 2023-07-29 DIAGNOSIS — F02C0 Dementia in other diseases classified elsewhere, severe, without behavioral disturbance, psychotic disturbance, mood disturbance, and anxiety: Secondary | ICD-10-CM | POA: Diagnosis not present

## 2023-07-30 DIAGNOSIS — R1312 Dysphagia, oropharyngeal phase: Secondary | ICD-10-CM | POA: Diagnosis not present

## 2023-07-30 DIAGNOSIS — F02C0 Dementia in other diseases classified elsewhere, severe, without behavioral disturbance, psychotic disturbance, mood disturbance, and anxiety: Secondary | ICD-10-CM | POA: Diagnosis not present

## 2023-07-30 DIAGNOSIS — G301 Alzheimer's disease with late onset: Secondary | ICD-10-CM | POA: Diagnosis not present

## 2023-07-30 DIAGNOSIS — R488 Other symbolic dysfunctions: Secondary | ICD-10-CM | POA: Diagnosis not present

## 2023-07-31 DIAGNOSIS — R488 Other symbolic dysfunctions: Secondary | ICD-10-CM | POA: Diagnosis not present

## 2023-07-31 DIAGNOSIS — G301 Alzheimer's disease with late onset: Secondary | ICD-10-CM | POA: Diagnosis not present

## 2023-07-31 DIAGNOSIS — R1312 Dysphagia, oropharyngeal phase: Secondary | ICD-10-CM | POA: Diagnosis not present

## 2023-07-31 DIAGNOSIS — F02C0 Dementia in other diseases classified elsewhere, severe, without behavioral disturbance, psychotic disturbance, mood disturbance, and anxiety: Secondary | ICD-10-CM | POA: Diagnosis not present

## 2023-08-01 DIAGNOSIS — F02C0 Dementia in other diseases classified elsewhere, severe, without behavioral disturbance, psychotic disturbance, mood disturbance, and anxiety: Secondary | ICD-10-CM | POA: Diagnosis not present

## 2023-08-01 DIAGNOSIS — R488 Other symbolic dysfunctions: Secondary | ICD-10-CM | POA: Diagnosis not present

## 2023-08-01 DIAGNOSIS — G301 Alzheimer's disease with late onset: Secondary | ICD-10-CM | POA: Diagnosis not present

## 2023-08-01 DIAGNOSIS — R1312 Dysphagia, oropharyngeal phase: Secondary | ICD-10-CM | POA: Diagnosis not present

## 2023-08-05 DIAGNOSIS — H524 Presbyopia: Secondary | ICD-10-CM | POA: Diagnosis not present

## 2023-08-05 DIAGNOSIS — Z961 Presence of intraocular lens: Secondary | ICD-10-CM | POA: Diagnosis not present

## 2023-08-16 ENCOUNTER — Other Ambulatory Visit (HOSPITAL_COMMUNITY)
Admission: RE | Admit: 2023-08-16 | Discharge: 2023-08-16 | Disposition: A | Discharge status: Home / Self Care | Payer: Medicare Other | Source: Skilled Nursing Facility | Attending: Adult Health | Admitting: Adult Health

## 2023-08-16 DIAGNOSIS — I10 Essential (primary) hypertension: Secondary | ICD-10-CM | POA: Insufficient documentation

## 2023-08-16 LAB — BASIC METABOLIC PANEL
Anion gap: 7 (ref 5–15)
BUN: 19 mg/dL (ref 8–23)
CO2: 24 mmol/L (ref 22–32)
Calcium: 9 mg/dL (ref 8.9–10.3)
Chloride: 108 mmol/L (ref 98–111)
Creatinine, Ser: 1.04 mg/dL — ABNORMAL HIGH (ref 0.44–1.00)
GFR, Estimated: 47 mL/min — ABNORMAL LOW (ref >60)
Glucose, Bld: 116 mg/dL — ABNORMAL HIGH (ref 70–99)
Potassium: 3.6 mmol/L (ref 3.5–5.1)
Sodium: 139 mmol/L (ref 135–145)

## 2023-08-19 ENCOUNTER — Encounter: Payer: Self-pay | Admitting: Adult Health

## 2023-08-19 ENCOUNTER — Non-Acute Institutional Stay (SKILLED_NURSING_FACILITY): Payer: Self-pay | Admitting: Adult Health

## 2023-08-19 DIAGNOSIS — K5909 Other constipation: Secondary | ICD-10-CM | POA: Diagnosis not present

## 2023-08-19 DIAGNOSIS — D696 Thrombocytopenia, unspecified: Secondary | ICD-10-CM | POA: Diagnosis not present

## 2023-08-19 DIAGNOSIS — E785 Hyperlipidemia, unspecified: Secondary | ICD-10-CM | POA: Diagnosis not present

## 2023-08-19 NOTE — Progress Notes (Signed)
Location:  [facility] Room Number: 149 Place of Service:  SNF (31)   CODE STATUS: dnr   No Known Allergies  Chief Complaint  Patient presents with   Medical Management of Chronic Issues        Thrombocytopenia:     Chronic constipation:     Hyperlipidemia LDL goal <130:     HPI:  She is a 89 year old long term resident of this facility being seen for the management of her chronic illnesses: Thrombocytopenia:     Chronic constipation:     Hyperlipidemia LDL goal <130:. Her weight is without significant change. There are no reports of constipation; no reports of uncontrolled pain   Past Medical History:  Diagnosis Date   Allergic rhinitis 11/25/2016   Arthritis    mild, no known falls uses cane   Dementia (HCC)    Hypertension    Neurofibroma 05/29/2017   Right breast skin lesion- pathology neurofibroma     Past Surgical History:  Procedure Laterality Date   MASS EXCISION Right 06/07/2017   Procedure: EXCISION SKIN LESION OF RIGHT BREAST;  Surgeon: Lucretia Roers, MD;  Location: [facility];  Service: General;  Laterality: Right;    Social History   Socioeconomic History   Marital status: Widowed    Spouse name: Not on file   Number of children: 0   Years of education: Not on file   Highest education level: Not on file  Occupational History   Not on file  Tobacco Use   Smoking status: Never   Smokeless tobacco: Never  Vaping Use   Vaping status: Never Used  Substance and Sexual Activity   Alcohol use: No   Drug use: No   Sexual activity: Not Currently  Other Topics Concern   Not on file  Social History Narrative   Not on file   Social Drivers of Health   Financial Resource Strain: Low Risk  (03/31/2018)   Overall Financial Resource Strain (CARDIA)    Difficulty of Paying Living Expenses: Not hard at all  Food Insecurity: No Food Insecurity (03/31/2018)   Hunger Vital Sign    Worried About Running Out of Food in the Last Year:  Never true    Ran Out of Food in the Last Year: Never true  Transportation Needs: No Transportation Needs (03/31/2018)   PRAPARE - Administrator, Civil Service (Medical): No    Lack of Transportation (Non-Medical): No  Physical Activity: Inactive (03/31/2018)   Exercise Vital Sign    Days of Exercise per Week: 0 days    Minutes of Exercise per Session: 0 min  Stress: No Stress Concern Present (03/31/2018)   [employer] of Occupational Health - Occupational Stress Questionnaire    Feeling of Stress : Not at all  Social Connections: Somewhat Isolated (03/31/2018)   Social Connection and Isolation Panel [NHANES]    Frequency of Communication with Friends and Family: More than three times a week    Frequency of Social Gatherings with Friends and Family: Once a week    Attends Religious Services: 1 to 4 times per year    Active Member of [employer] or Organizations: No    Attends Banker Meetings: Never    Marital Status: Widowed  Intimate Partner Violence: Not At Risk (03/31/2018)   Humiliation, Afraid, Rape, and Kick questionnaire    Fear of Current or Ex-Partner: No    Emotionally Abused: No    Physically  Abused: No    Sexually Abused: No   Family History  Problem Relation Age of Onset   Diabetes Sister    Hypertension Sister       VITAL SIGNS BP 100/62   Pulse 60   Temp 97.6 F (36.4 C)   Resp 16   Ht 5\' 2"  (1.575 m)   Wt 161 lb (73 kg)   SpO2 95%   BMI 29.45 kg/m   Outpatient Encounter Medications as of 08/19/2023  Medication Sig   acetaminophen (TYLENOL) 325 MG tablet Take 650 mg by mouth every 6 (six) hours as needed.   albuterol (PROVENTIL) (2.5 MG/3ML) 0.083% nebulizer solution Take 2.5 mg by nebulization every 4 (four) hours as needed for wheezing or shortness of breath.   NON FORMULARY Diet: Dysphagia 1 (puree) with thin liquids   polyethylene glycol (MIRALAX / GLYCOLAX) 17 g packet Take 17 g by mouth daily.   polyvinyl alcohol  (LIQUIFILM TEARS) 1.4 % ophthalmic solution Place 1 drop into both eyes in the morning and at bedtime. Wait at least 5 minutes between multiple drops in same eye.   No facility-administered encounter medications on file as of 08/19/2023.     SIGNIFICANT DIAGNOSTIC EXAMS  LABS REVIEWED:     09-16-22: wbc 5.1; hgb 12.4; hct 39.6; mcv 97.5 plt 125; glucose 88; bun 22; creat 1.17; k+ 3.4; na++ 142; ca 7.9; gfr 41 11-08-22: vitamin B 12: 256  01-25-23: albumin 3.0    04-18-23: wbc 5.0; hgb 12.6; hct 39.4 mcv 96.6 plt 153; glucose 87; bun 18; creat 0.91; k+ 3.7; na++ 139; ca 8.4 gfr 55; protein 5.5 albumin 2.9  TODAY  08-16-23: glucose 116; bun 19; creat 1.04 ;k+ 36; na++ 139; ca 9.0; gfr 47    Review of Systems  Constitutional:  Negative for malaise/fatigue.  Respiratory:  Negative for cough and shortness of breath.   Cardiovascular:  Negative for chest pain, palpitations and leg swelling.  Gastrointestinal:  Negative for abdominal pain, constipation and heartburn.  Musculoskeletal:  Negative for back pain, joint pain and myalgias.  Skin: Negative.   Neurological:  Negative for dizziness.  Psychiatric/Behavioral:  The patient is not nervous/anxious.    Physical Exam Constitutional:      General: She is not in acute distress.    Appearance: She is well-developed. She is not diaphoretic.  Neck:     Thyroid: No thyromegaly.  Cardiovascular:     Rate and Rhythm: Normal rate and regular rhythm.     Pulses: Normal pulses.     Heart sounds: Normal heart sounds.  Pulmonary:     Effort: Pulmonary effort is normal. No respiratory distress.     Breath sounds: Normal breath sounds.  Abdominal:     General: Bowel sounds are normal. There is no distension.     Palpations: Abdomen is soft.     Tenderness: There is no abdominal tenderness.  Musculoskeletal:        General: Normal range of motion.     Cervical back: Neck supple.     Right lower leg: No edema.     Left lower leg: No edema.   Lymphadenopathy:     Cervical: No cervical adenopathy.  Skin:    General: Skin is warm and dry.  Neurological:     Mental Status: She is alert. Mental status is at baseline.  Psychiatric:        Mood and Affect: Mood normal.     ASSESSMENT/ PLAN:  TODAY   Thrombocytopenia:  plt 153  2. Chronic constipation: will continue miralax daily   3. Hyperlipidemia LDL goal <130: not on statin due to advanced age.    PREVIOUS   4. Chronic kidney disease stage 3a: bun 19; creat 1.04 gfr 47  5. Chronic respiratory failure with hypoxia: will monitor   6. Severe late onset alzheimer's disease without behavioral disturbance, psychotic disturbance; mood disturbance or anxiety: weight is 161 pounds will monitor   7. Essential hypertension: b/p 100/62  8.Severe protein calorie malnutrition: protein 5.5 albumin 2.9; will monitor   9. Anemia due to CKD stage 3 hgb 12.6 is off iron  10. Aortic atherosclerosis (ct 11-16-17) not on statin due to advanced age.      Synthia Innocent NP [facility]   call [phone]

## 2023-09-04 ENCOUNTER — Non-Acute Institutional Stay (SKILLED_NURSING_FACILITY): Payer: Self-pay | Admitting: Internal Medicine

## 2023-09-04 ENCOUNTER — Encounter: Payer: Self-pay | Admitting: Internal Medicine

## 2023-09-04 DIAGNOSIS — N1832 Chronic kidney disease, stage 3b: Secondary | ICD-10-CM | POA: Diagnosis not present

## 2023-09-04 DIAGNOSIS — J9611 Chronic respiratory failure with hypoxia: Secondary | ICD-10-CM

## 2023-09-04 DIAGNOSIS — E43 Unspecified severe protein-calorie malnutrition: Secondary | ICD-10-CM

## 2023-09-04 DIAGNOSIS — F0150 Vascular dementia without behavioral disturbance: Secondary | ICD-10-CM | POA: Diagnosis not present

## 2023-09-04 NOTE — Assessment & Plan Note (Signed)
 08/16/2023 renal function has improved with current creatinine 1.04 and eGFR of 47 indicating CKD low stage IIIa.  In January 2024 creatinine was 1.17 and GFR 41 compatible with CKD stage IIIb.  Med list reviewed; no indication for change in meds or dosages.

## 2023-09-04 NOTE — Patient Instructions (Signed)
 See assessment and plan under each diagnosis in the problem list and acutely for this visit

## 2023-09-04 NOTE — Assessment & Plan Note (Addendum)
 O2 sats are excellent on room air.  Today she exhibited an intermittent nonproductive cough.  Limited chest auscultation suggested decreased breath sounds without wheezing or rhonchi.  As needed albuterol  ordered for any bronchospastic component.  Continue to monitor.

## 2023-09-04 NOTE — Assessment & Plan Note (Signed)
 Albumin and total protein have not been updated; but Nutritionist monitors intake and weight at the SNF.

## 2023-09-04 NOTE — Progress Notes (Signed)
   NURSING HOME LOCATION:  [facility] ROOM NUMBER:  [id number] D  CODE STATUS:  DNR  PCP:  Britt Candle NP  This is a nursing facility follow up visit of chronic medical diagnoses & to document compliance with Regulation 483.30 (c) in The Long Term Care Survey Manual Phase 2 which mandates caregiver visit ( visits can alternate among physician, PA or NP as per statutes) within 10 days of 30 days / 60 days/ 90 days post admission to SNF date    Interim medical record and care since last SNF visit was updated with review of diagnostic studies and change in clinical status since last visit were documented.  HPI: She is a permanent resident of this facility with medical diagnoses of allergic rhinitis, degenerative joint disease, vascular dementia, history of neurofibroma, and essential hypertension. Labs are current as of 08/16/2023.  Serially there has been improvement in her renal function with current creatinine of 1.04 and  eGFR 47 with prior values of 1.17 and GFR of 41.  This would be compatible with low stage IIIa CKD.  Protein/caloric malnutrition has been documented with albumin of 2.9 and total protein of 5.5.  Mild hyperglycemia was present with a value of 116.  The last A1c on record was 5.7% on 10/26/2015.  Last TSH on record was therapeutic at 2.17 on 01/19/2019.  Labs are not done routinely as she is 89 and has no significant debilities at this time beyond age & dementia. The only significant medication she is taking it is albuterol  as needed.  She has a history of hypertension but blood pressure is adequately controlled without medications.  Review of systems could not be completed as she refused an interview or exam.  Physical exam:  Pertinent or positive findings: She was sitting quietly outside her room in a wheelchair.  I introduced myself and told her that I was there for routine 90-month follow-up as required by Medicare for the facility licensure. At that point she  told me that she did not reside here and was only here to visit her sister.  She stated "I live in [place]." She exhibited intermittent dry cough; but when I attempted to auscultate her chest she pushed me away.  She stated she would only be examined by doctor.  I showed her my name tag and told her that the "MD" meant medical doctor.  This did not assuage her paranoia.  Exam was limited to a visual exam.  She appears younger than her stated age.  She is wearing a wig.  She does exhibit central obesity.  She has prominent lacrimal glands.  Lids are puffy.  Her chin and jaw appear to have been shaved.  Limited auscultation of the chest suggested decreased breath sounds.  Abdomen is protuberant and nontender.  I inquired of her Nurse about this behavior which I had not seen on the previous exams.  I was told that this is an intermittent phenomena as is the nonproductive cough.  COVID screening was performed this morning and was negative.   See summary under each active problem in the Problem List with associated updated therapeutic plan

## 2023-09-04 NOTE — Assessment & Plan Note (Addendum)
 Usually she is pleasantly demented and confabulating nonsensically.  Today she refused to be interviewed or examined despite my showing her my identification badge.  Staff reports that intermittently she will be hostile and refuses any interaction.  She confabulated about being here only to visit her sister, stating that she herself did not live in this facility.  Labs and clean-catch urine will be ordered if the behavior is persistent or progressive.

## 2023-10-09 ENCOUNTER — Encounter: Payer: Self-pay | Admitting: Adult Health

## 2023-10-09 ENCOUNTER — Non-Acute Institutional Stay (SKILLED_NURSING_FACILITY): Payer: Self-pay | Admitting: Adult Health

## 2023-10-09 DIAGNOSIS — N1832 Chronic kidney disease, stage 3b: Secondary | ICD-10-CM

## 2023-10-09 DIAGNOSIS — G301 Alzheimer's disease with late onset: Secondary | ICD-10-CM | POA: Diagnosis not present

## 2023-10-09 DIAGNOSIS — J9611 Chronic respiratory failure with hypoxia: Secondary | ICD-10-CM | POA: Diagnosis not present

## 2023-10-09 DIAGNOSIS — F02C0 Dementia in other diseases classified elsewhere, severe, without behavioral disturbance, psychotic disturbance, mood disturbance, and anxiety: Secondary | ICD-10-CM | POA: Diagnosis not present

## 2023-10-09 NOTE — Progress Notes (Signed)
 Location:  [facility] Room Number: [id number] Place of Service:  SNF (31)   CODE STATUS: dnr   No Known Allergies  Chief Complaint  Patient presents with   Medical Management of Chronic Issues         Chronic kidney disease stage 3a:   Chronic respiratory failure with hypoxia:  Severe late onset alzheimer's disease without behavioral disturbance, psychotic disturbance, mood disturbance or anxiety     HPI:  She is a 89 year old long term resident of this facility being seen for the management of her chronic illnesses: Chronic kidney disease stage 3a:   Chronic respiratory failure with hypoxia:  Severe late onset alzheimer's disease without behavioral disturbance, psychotic disturbance, mood disturbance or anxiety. She continues to get out of bed daily; there are no reports of uncontrolled pain; her weight is without significant change from last month.   Past Medical History:  Diagnosis Date   Allergic rhinitis 11/25/2016   Arthritis    mild, no known falls uses cane   Dementia (HCC)    Hypertension    Neurofibroma 05/29/2017   Right breast skin lesion- pathology neurofibroma     Past Surgical History:  Procedure Laterality Date   MASS EXCISION Right 06/07/2017   Procedure: EXCISION SKIN LESION OF RIGHT BREAST;  Surgeon: Lucretia Roers, MD;  Location: [facility];  Service: General;  Laterality: Right;    Social History   Socioeconomic History   Marital status: Widowed    Spouse name: Not on file   Number of children: 0   Years of education: Not on file   Highest education level: Not on file  Occupational History   Not on file  Tobacco Use   Smoking status: Never   Smokeless tobacco: Never  Vaping Use   Vaping status: Never Used  Substance and Sexual Activity   Alcohol use: No   Drug use: No   Sexual activity: Not Currently  Other Topics Concern   Not on file  Social History Narrative   Not on file   Social Drivers of Health   Financial  Resource Strain: Low Risk  (03/31/2018)   Overall Financial Resource Strain (CARDIA)    Difficulty of Paying Living Expenses: Not hard at all  Food Insecurity: No Food Insecurity (03/31/2018)   Hunger Vital Sign    Worried About Running Out of Food in the Last Year: Never true    Ran Out of Food in the Last Year: Never true  Transportation Needs: No Transportation Needs (03/31/2018)   PRAPARE - Administrator, Civil Service (Medical): No    Lack of Transportation (Non-Medical): No  Physical Activity: Inactive (03/31/2018)   Exercise Vital Sign    Days of Exercise per Week: 0 days    Minutes of Exercise per Session: 0 min  Stress: No Stress Concern Present (03/31/2018)   [employer] of Occupational Health - Occupational Stress Questionnaire    Feeling of Stress : Not at all  Social Connections: Somewhat Isolated (03/31/2018)   Social Connection and Isolation Panel [NHANES]    Frequency of Communication with Friends and Family: More than three times a week    Frequency of Social Gatherings with Friends and Family: Once a week    Attends Religious Services: 1 to 4 times per year    Active Member of [employer] or Organizations: No    Attends Banker Meetings: Never    Marital Status: Widowed  Catering manager  Violence: Not At Risk (03/31/2018)   Humiliation, Afraid, Rape, and Kick questionnaire    Fear of Current or Ex-Partner: No    Emotionally Abused: No    Physically Abused: No    Sexually Abused: No   Family History  Problem Relation Age of Onset   Diabetes Sister    Hypertension Sister       VITAL SIGNS BP 137/65   Pulse (!) 58   Temp (!) 96.8 F (36 C)   Resp 20   Ht 4\' 11"  (1.499 m)   Wt 158 lb (71.7 kg)   SpO2 95%   BMI 31.91 kg/m   Outpatient Encounter Medications as of 10/09/2023  Medication Sig   acetaminophen (TYLENOL) 325 MG tablet Take 650 mg by mouth every 6 (six) hours as needed.   albuterol (PROVENTIL) (2.5 MG/3ML) 0.083% nebulizer  solution Take 2.5 mg by nebulization every 4 (four) hours as needed for wheezing or shortness of breath.   NON FORMULARY Diet: Dysphagia 1 (puree) with thin liquids   polyethylene glycol (MIRALAX / GLYCOLAX) 17 g packet Take 17 g by mouth daily.   polyvinyl alcohol (LIQUIFILM TEARS) 1.4 % ophthalmic solution Place 1 drop into both eyes in the morning and at bedtime. Wait at least 5 minutes between multiple drops in same eye.   No facility-administered encounter medications on file as of 10/09/2023.     SIGNIFICANT DIAGNOSTIC EXAMS  LABS REVIEWED:     11-08-22: vitamin B 12: 256  01-25-23: albumin 3.0    04-18-23: wbc 5.0; hgb 12.6; hct 39.4 mcv 96.6 plt 153; glucose 87; bun 18; creat 0.91; k+ 3.7; na++ 139; ca 8.4 gfr 55; protein 5.5 albumin 2.9 08-16-23: glucose 116; bun 19; creat 1.04 ;k+ 36; na++ 139; ca 9.0; gfr 47    NO NEW LABS.   Review of Systems  Constitutional:  Negative for malaise/fatigue.  Respiratory:  Negative for cough and shortness of breath.   Cardiovascular:  Negative for chest pain, palpitations and leg swelling.  Gastrointestinal:  Negative for abdominal pain, constipation and heartburn.  Musculoskeletal:  Negative for back pain, joint pain and myalgias.  Skin: Negative.   Neurological:  Negative for dizziness.  Psychiatric/Behavioral:  The patient is not nervous/anxious.    Physical Exam Constitutional:      General: She is not in acute distress.    Appearance: She is well-developed. She is not diaphoretic.  Neck:     Thyroid: No thyromegaly.  Cardiovascular:     Rate and Rhythm: Normal rate and regular rhythm.     Pulses: Normal pulses.     Heart sounds: Normal heart sounds.  Pulmonary:     Effort: Pulmonary effort is normal. No respiratory distress.     Breath sounds: Normal breath sounds.  Abdominal:     General: Bowel sounds are normal. There is no distension.     Palpations: Abdomen is soft.     Tenderness: There is no abdominal tenderness.   Musculoskeletal:        General: Normal range of motion.     Cervical back: Neck supple.     Right lower leg: No edema.     Left lower leg: No edema.  Lymphadenopathy:     Cervical: No cervical adenopathy.  Skin:    General: Skin is warm and dry.  Neurological:     Mental Status: She is alert. Mental status is at baseline.  Psychiatric:        Mood and Affect: Mood normal.  ASSESSMENT/ PLAN:  TODAY    Chronic kidney disease stage 3a: bun 19; creat 1.04 gfr 47  2. Chronic respiratory failure with hypoxia: will monitor is on room air  3. Severe late onset alzheimer's disease without behavioral disturbance, psychotic disturbance, mood disturbance or anxiety: weight is 158 pounds    PREVIOUS   4. Essential hypertension: b/p 137/65  5.Severe protein calorie malnutrition: protein 5.5 albumin 2.9; will monitor   6. Anemia due to CKD stage 3 hgb 12.6 is off iron  7. Aortic atherosclerosis (ct 11-16-17) not on statin due to advanced age.   8. Thrombocytopenia: plt 153  9. Chronic constipation: will continue miralax daily   10. Hyperlipidemia LDL goal <130: not on statin due to advanced age.   Synthia Innocent NP [facility]  call [phone]

## 2023-10-17 ENCOUNTER — Other Ambulatory Visit (HOSPITAL_COMMUNITY)
Admission: RE | Admit: 2023-10-17 | Discharge: 2023-10-17 | Disposition: A | Discharge status: Home / Self Care | Payer: Medicare Other | Source: Skilled Nursing Facility | Attending: Adult Health | Admitting: Adult Health

## 2023-10-17 ENCOUNTER — Encounter: Payer: Self-pay | Admitting: Adult Health

## 2023-10-17 ENCOUNTER — Non-Acute Institutional Stay (SKILLED_NURSING_FACILITY): Payer: Self-pay | Admitting: Adult Health

## 2023-10-17 DIAGNOSIS — I700 Atherosclerosis of aorta: Secondary | ICD-10-CM

## 2023-10-17 DIAGNOSIS — J9611 Chronic respiratory failure with hypoxia: Secondary | ICD-10-CM

## 2023-10-17 DIAGNOSIS — N1832 Chronic kidney disease, stage 3b: Secondary | ICD-10-CM | POA: Diagnosis not present

## 2023-10-17 DIAGNOSIS — I10 Essential (primary) hypertension: Secondary | ICD-10-CM | POA: Insufficient documentation

## 2023-10-17 LAB — CBC
HCT: 38.9 % (ref 36.0–46.0)
Hemoglobin: 12.4 g/dL (ref 12.0–15.0)
MCH: 30.6 pg (ref 26.0–34.0)
MCHC: 31.9 g/dL (ref 30.0–36.0)
MCV: 96 fL (ref 80.0–100.0)
Platelets: 160 10*3/uL (ref 150–400)
RBC: 4.05 MIL/uL (ref 3.87–5.11)
RDW: 14 % (ref 11.5–15.5)
WBC: 5.1 10*3/uL (ref 4.0–10.5)
nRBC: 0 % (ref 0.0–0.2)

## 2023-10-17 LAB — COMPREHENSIVE METABOLIC PANEL
ALT: 11 U/L (ref 0–44)
AST: 16 U/L (ref 15–41)
Albumin: 3.1 g/dL — ABNORMAL LOW (ref 3.5–5.0)
Alkaline Phosphatase: 49 U/L (ref 38–126)
Anion gap: 5 (ref 5–15)
BUN: 19 mg/dL (ref 8–23)
CO2: 27 mmol/L (ref 22–32)
Calcium: 8.6 mg/dL — ABNORMAL LOW (ref 8.9–10.3)
Chloride: 108 mmol/L (ref 98–111)
Creatinine, Ser: 0.89 mg/dL (ref 0.44–1.00)
GFR, Estimated: 57 mL/min — ABNORMAL LOW (ref >60)
Glucose, Bld: 86 mg/dL (ref 70–99)
Potassium: 3.7 mmol/L (ref 3.5–5.1)
Sodium: 140 mmol/L (ref 135–145)
Total Bilirubin: 0.5 mg/dL (ref 0.0–1.2)
Total Protein: 5.9 g/dL — ABNORMAL LOW (ref 6.5–8.1)

## 2023-10-17 LAB — VITAMIN B12: Vitamin B-12: 220 pg/mL (ref 180–914)

## 2023-10-17 NOTE — Progress Notes (Signed)
 Location:  [facility] Room Number: [id number] Place of Service:  SNF (31) Katrina Carmicheal S,NP  CODE STATUS: DNR  No Known Allergies  Chief Complaint  Patient presents with   Acute Visit    Care plan meeting    HPI:  We have come together for her care plan meeting.  BIMS 5/15; mood 0/30. Uses wheelchair with one fall without injury. She requires moderate to dependent assist with her adl care. She is frequently incontinent of bladder and bowel. Dietary: setup for meals D1 diet with appetite 51-100%; weight is 158 pounds. Therapy: none at this time. She continues to be followed for her chronic illnesses including:  Aortic atherosclerosis  Chronic respiratory failure with hypoxia   Stage 3b chronic kidney disease  Past Medical History:  Diagnosis Date   Allergic rhinitis 11/25/2016   Arthritis    mild, no known falls uses cane   Dementia (HCC)    Hypertension    Neurofibroma 05/29/2017   Right breast skin lesion- pathology neurofibroma     Past Surgical History:  Procedure Laterality Date   MASS EXCISION Right 06/07/2017   Procedure: EXCISION SKIN LESION OF RIGHT BREAST;  Surgeon: Lucretia Roers, MD;  Location: [facility];  Service: General;  Laterality: Right;    Social History   Socioeconomic History   Marital status: Widowed    Spouse name: Not on file   Number of children: 0   Years of education: Not on file   Highest education level: Not on file  Occupational History   Not on file  Tobacco Use   Smoking status: Never   Smokeless tobacco: Never  Vaping Use   Vaping status: Never Used  Substance and Sexual Activity   Alcohol use: No   Drug use: No   Sexual activity: Not Currently  Other Topics Concern   Not on file  Social History Narrative   Not on file   Social Drivers of Health   Financial Resource Strain: Low Risk  (03/31/2018)   Overall Financial Resource Strain (CARDIA)    Difficulty of Paying Living Expenses: Not hard at all  Food  Insecurity: No Food Insecurity (03/31/2018)   Hunger Vital Sign    Worried About Running Out of Food in the Last Year: Never true    Ran Out of Food in the Last Year: Never true  Transportation Needs: No Transportation Needs (03/31/2018)   PRAPARE - Administrator, Civil Service (Medical): No    Lack of Transportation (Non-Medical): No  Physical Activity: Inactive (03/31/2018)   Exercise Vital Sign    Days of Exercise per Week: 0 days    Minutes of Exercise per Session: 0 min  Stress: No Stress Concern Present (03/31/2018)   [employer] of Occupational Health - Occupational Stress Questionnaire    Feeling of Stress : Not at all  Social Connections: Somewhat Isolated (03/31/2018)   Social Connection and Isolation Panel [NHANES]    Frequency of Communication with Friends and Family: More than three times a week    Frequency of Social Gatherings with Friends and Family: Once a week    Attends Religious Services: 1 to 4 times per year    Active Member of [employer] or Organizations: No    Attends Banker Meetings: Never    Marital Status: Widowed  Intimate Partner Violence: Not At Risk (03/31/2018)   Humiliation, Afraid, Rape, and Kick questionnaire    Fear of Current or Ex-Partner: No  Emotionally Abused: No    Physically Abused: No    Sexually Abused: No   Family History  Problem Relation Age of Onset   Diabetes Sister    Hypertension Sister       VITAL SIGNS BP 139/77   Pulse (!) 54   Temp 98.1 F (36.7 C)   Resp 19   Ht 4\' 11"  (1.499 m)   Wt 158 lb (71.7 kg)   SpO2 99%   BMI 31.91 kg/m   Outpatient Encounter Medications as of 10/17/2023  Medication Sig   acetaminophen (TYLENOL) 325 MG tablet Take 650 mg by mouth every 6 (six) hours as needed.   albuterol (PROVENTIL) (2.5 MG/3ML) 0.083% nebulizer solution Take 2.5 mg by nebulization every 4 (four) hours as needed for wheezing or shortness of breath.   NON FORMULARY Diet: Dysphagia 1 (puree)  with thin liquids   polyethylene glycol (MIRALAX / GLYCOLAX) 17 g packet Take 17 g by mouth daily.   polyvinyl alcohol (LIQUIFILM TEARS) 1.4 % ophthalmic solution Place 1 drop into both eyes in the morning and at bedtime. Wait at least 5 minutes between multiple drops in same eye.   No facility-administered encounter medications on file as of 10/17/2023.     SIGNIFICANT DIAGNOSTIC EXAMS  LABS REVIEWED:     11-08-22: vitamin B 12: 256  01-25-23: albumin 3.0    04-18-23: wbc 5.0; hgb 12.6; hct 39.4 mcv 96.6 plt 153; glucose 87; bun 18; creat 0.91; k+ 3.7; na++ 139; ca 8.4 gfr 55; protein 5.5 albumin 2.9 08-16-23: glucose 116; bun 19; creat 1.04 ;k+ 36; na++ 139; ca 9.0; gfr 47    NO NEW LABS.   Review of Systems  Constitutional:  Negative for malaise/fatigue.  Respiratory:  Negative for cough and shortness of breath.   Cardiovascular:  Negative for chest pain, palpitations and leg swelling.  Gastrointestinal:  Negative for abdominal pain, constipation and heartburn.  Musculoskeletal:  Negative for back pain, joint pain and myalgias.  Skin: Negative.   Neurological:  Negative for dizziness.  Psychiatric/Behavioral:  The patient is not nervous/anxious.     Physical Exam Constitutional:      General: She is not in acute distress.    Appearance: She is well-developed. She is not diaphoretic.  Neck:     Thyroid: No thyromegaly.  Cardiovascular:     Rate and Rhythm: Normal rate and regular rhythm.     Heart sounds: Normal heart sounds.  Pulmonary:     Effort: Pulmonary effort is normal. No respiratory distress.     Breath sounds: Normal breath sounds.  Abdominal:     General: Bowel sounds are normal. There is no distension.     Palpations: Abdomen is soft.     Tenderness: There is no abdominal tenderness.  Musculoskeletal:        General: Normal range of motion.     Cervical back: Neck supple.     Right lower leg: No edema.     Left lower leg: No edema.  Lymphadenopathy:      Cervical: No cervical adenopathy.  Skin:    General: Skin is warm and dry.  Neurological:     Mental Status: She is alert. Mental status is at baseline.  Psychiatric:        Mood and Affect: Mood normal.    ASSESSMENT/ PLAN:  TODAY  Aortic atherosclerosis Chronic respiratory failure with hypoxia Stage 3b chronic kidney disease  Will continue current medications Will continue current plan of care Will  continue to monitor her status.   Time spent with patient: 40 minutes: medications; plan of care dietary.    Synthia Innocent NP [facility]  call [phone]

## 2023-10-28 ENCOUNTER — Other Ambulatory Visit (HOSPITAL_COMMUNITY)
Admission: RE | Admit: 2023-10-28 | Discharge: 2023-10-28 | Disposition: A | Discharge status: Home / Self Care | Source: Skilled Nursing Facility | Attending: Adult Health | Admitting: Adult Health

## 2023-10-28 DIAGNOSIS — R509 Fever, unspecified: Secondary | ICD-10-CM | POA: Insufficient documentation

## 2023-10-28 LAB — RESP PANEL BY RT-PCR (RSV, FLU A&B, COVID)  RVPGX2
Influenza A by PCR: POSITIVE — AB
Influenza B by PCR: NEGATIVE
Resp Syncytial Virus by PCR: NEGATIVE
SARS Coronavirus 2 by RT PCR: NEGATIVE

## 2023-11-05 ENCOUNTER — Encounter: Payer: Self-pay | Admitting: Adult Health

## 2023-11-05 ENCOUNTER — Non-Acute Institutional Stay (SKILLED_NURSING_FACILITY): Payer: Self-pay | Admitting: Adult Health

## 2023-11-05 DIAGNOSIS — J90 Pleural effusion, not elsewhere classified: Secondary | ICD-10-CM

## 2023-11-05 NOTE — Progress Notes (Unsigned)
 Location:  [facility] Room Number: 149 Place of Service:  SNF (31)   CODE STATUS: dnr   No Known Allergies  Chief Complaint  Patient presents with   Acute Visit    Follow up chest x-ray     HPI:  She has developed a cough; shortness of breath and lethargy over the past several days. She had a chest x-ray which demonstrated  a left lung effusion. She continues to get out of bed daily; her weight is without significant changes   Past Medical History:  Diagnosis Date   Allergic rhinitis 11/25/2016   Arthritis    mild, no known falls uses cane   Dementia (HCC)    Hypertension    Neurofibroma 05/29/2017   Right breast skin lesion- pathology neurofibroma     Past Surgical History:  Procedure Laterality Date   MASS EXCISION Right 06/07/2017   Procedure: EXCISION SKIN LESION OF RIGHT BREAST;  Surgeon: Lucretia Roers, MD;  Location: [facility];  Service: General;  Laterality: Right;    Social History   Socioeconomic History   Marital status: Widowed    Spouse name: Not on file   Number of children: 0   Years of education: Not on file   Highest education level: Not on file  Occupational History   Not on file  Tobacco Use   Smoking status: Never   Smokeless tobacco: Never  Vaping Use   Vaping status: Never Used  Substance and Sexual Activity   Alcohol use: No   Drug use: No   Sexual activity: Not Currently  Other Topics Concern   Not on file  Social History Narrative   Not on file   Social Drivers of Health   Financial Resource Strain: Low Risk  (03/31/2018)   Overall Financial Resource Strain (CARDIA)    Difficulty of Paying Living Expenses: Not hard at all  Food Insecurity: No Food Insecurity (03/31/2018)   Hunger Vital Sign    Worried About Running Out of Food in the Last Year: Never true    Ran Out of Food in the Last Year: Never true  Transportation Needs: No Transportation Needs (03/31/2018)   PRAPARE - Scientist, research (physical sciences) (Medical): No    Lack of Transportation (Non-Medical): No  Physical Activity: Inactive (03/31/2018)   Exercise Vital Sign    Days of Exercise per Week: 0 days    Minutes of Exercise per Session: 0 min  Stress: No Stress Concern Present (03/31/2018)   [employer] of Occupational Health - Occupational Stress Questionnaire    Feeling of Stress : Not at all  Social Connections: Somewhat Isolated (03/31/2018)   Social Connection and Isolation Panel [NHANES]    Frequency of Communication with Friends and Family: More than three times a week    Frequency of Social Gatherings with Friends and Family: Once a week    Attends Religious Services: 1 to 4 times per year    Active Member of [employer] or Organizations: No    Attends Banker Meetings: Never    Marital Status: Widowed  Intimate Partner Violence: Not At Risk (03/31/2018)   Humiliation, Afraid, Rape, and Kick questionnaire    Fear of Current or Ex-Partner: No    Emotionally Abused: No    Physically Abused: No    Sexually Abused: No   Family History  Problem Relation Age of Onset   Diabetes Sister    Hypertension Sister  VITAL SIGNS BP 135/67   Pulse 74   Temp 98.1 F (36.7 C)   Resp 19   Ht 4\' 11"  (1.499 m)   Wt 160 lb (72.6 kg)   SpO2 97%   BMI 32.32 kg/m   Outpatient Encounter Medications as of 11/05/2023  Medication Sig   acetaminophen (TYLENOL) 325 MG tablet Take 650 mg by mouth every 6 (six) hours as needed.   albuterol (PROVENTIL) (2.5 MG/3ML) 0.083% nebulizer solution Take 2.5 mg by nebulization every 4 (four) hours as needed for wheezing or shortness of breath.   NON FORMULARY Diet: Dysphagia 1 (puree) with thin liquids   polyethylene glycol (MIRALAX / GLYCOLAX) 17 g packet Take 17 g by mouth daily.   polyvinyl alcohol (LIQUIFILM TEARS) 1.4 % ophthalmic solution Place 1 drop into both eyes in the morning and at bedtime. Wait at least 5 minutes between multiple drops in same eye.    No facility-administered encounter medications on file as of 11/05/2023.     SIGNIFICANT DIAGNOSTIC EXAMS  LABS REVIEWED:     11-08-22: vitamin B 12: 256  01-25-23: albumin 3.0    04-18-23: wbc 5.0; hgb 12.6; hct 39.4 mcv 96.6 plt 153; glucose 87; bun 18; creat 0.91; k+ 3.7; na++ 139; ca 8.4 gfr 55; protein 5.5 albumin 2.9 08-16-23: glucose 116; bun 19; creat 1.04 ;k+ 36; na++ 139; ca 9.0; gfr 47    NO NEW LABS.   Review of Systems  Constitutional:  Negative for malaise/fatigue.  Respiratory:  Positive for cough and shortness of breath.   Cardiovascular:  Negative for chest pain, palpitations and leg swelling.  Gastrointestinal:  Negative for abdominal pain, constipation and heartburn.  Musculoskeletal:  Negative for back pain, joint pain and myalgias.  Skin: Negative.   Neurological:  Negative for dizziness.  Psychiatric/Behavioral:  The patient is not nervous/anxious.    Physical Exam Constitutional:      General: She is not in acute distress.    Appearance: She is well-developed. She is not diaphoretic.  Neck:     Thyroid: No thyromegaly.  Cardiovascular:     Rate and Rhythm: Normal rate and regular rhythm.     Heart sounds: Normal heart sounds.  Pulmonary:     Effort: Pulmonary effort is normal. No respiratory distress.     Breath sounds: Wheezing present.  Abdominal:     General: Bowel sounds are normal. There is no distension.     Palpations: Abdomen is soft.     Tenderness: There is no abdominal tenderness.  Musculoskeletal:        General: Normal range of motion.     Cervical back: Neck supple.     Right lower leg: No edema.     Left lower leg: No edema.  Lymphadenopathy:     Cervical: No cervical adenopathy.  Skin:    General: Skin is warm and dry.  Neurological:     Mental Status: She is alert. Mental status is at baseline.  Psychiatric:        Mood and Affect: Mood normal.      ASSESSMENT/ PLAN:  TODAY  Left pleural effusion: will begin lasix 20  mg daily for 3 days.    Synthia Innocent NP [facility]  call [phone]

## 2023-11-06 ENCOUNTER — Non-Acute Institutional Stay (SKILLED_NURSING_FACILITY): Payer: Self-pay | Admitting: Adult Health

## 2023-11-06 ENCOUNTER — Encounter: Payer: Self-pay | Admitting: Adult Health

## 2023-11-06 DIAGNOSIS — D649 Anemia, unspecified: Secondary | ICD-10-CM

## 2023-11-06 DIAGNOSIS — I10 Essential (primary) hypertension: Secondary | ICD-10-CM

## 2023-11-06 DIAGNOSIS — J90 Pleural effusion, not elsewhere classified: Secondary | ICD-10-CM | POA: Insufficient documentation

## 2023-11-06 DIAGNOSIS — E43 Unspecified severe protein-calorie malnutrition: Secondary | ICD-10-CM | POA: Diagnosis not present

## 2023-11-06 NOTE — Progress Notes (Signed)
 Location:  [facility] Room Number: [id number] Place of Service:  SNF (31)   CODE STATUS: dnr   No Known Allergies  Chief Complaint  Patient presents with   Medical Management of Chronic Issues          Essential hypertension:  Severe protein calorie malnutrition: Anemia due to ckd stage 3      HPI:  She is a 89 year old long term resident of this facility being seen for the management of her chronic illnesses: Essential hypertension:  Severe protein calorie malnutrition: Anemia due to ckd stage 3. There are no reports of uncontrolled pain. She has been treated for influenza A this past month without complications. Her weight remains stable.   Past Medical History:  Diagnosis Date   Allergic rhinitis 11/25/2016   Arthritis    mild, no known falls uses cane   Dementia (HCC)    Hypertension    Neurofibroma 05/29/2017   Right breast skin lesion- pathology neurofibroma     Past Surgical History:  Procedure Laterality Date   MASS EXCISION Right 06/07/2017   Procedure: EXCISION SKIN LESION OF RIGHT BREAST;  Surgeon: Lucretia Roers, MD;  Location: [facility];  Service: General;  Laterality: Right;    Social History   Socioeconomic History   Marital status: Widowed    Spouse name: Not on file   Number of children: 0   Years of education: Not on file   Highest education level: Not on file  Occupational History   Not on file  Tobacco Use   Smoking status: Never   Smokeless tobacco: Never  Vaping Use   Vaping status: Never Used  Substance and Sexual Activity   Alcohol use: No   Drug use: No   Sexual activity: Not Currently  Other Topics Concern   Not on file  Social History Narrative   Not on file   Social Drivers of Health   Financial Resource Strain: Low Risk  (03/31/2018)   Overall Financial Resource Strain (CARDIA)    Difficulty of Paying Living Expenses: Not hard at all  Food Insecurity: No Food Insecurity (03/31/2018)   Hunger Vital Sign     Worried About Running Out of Food in the Last Year: Never true    Ran Out of Food in the Last Year: Never true  Transportation Needs: No Transportation Needs (03/31/2018)   PRAPARE - Administrator, Civil Service (Medical): No    Lack of Transportation (Non-Medical): No  Physical Activity: Inactive (03/31/2018)   Exercise Vital Sign    Days of Exercise per Week: 0 days    Minutes of Exercise per Session: 0 min  Stress: No Stress Concern Present (03/31/2018)   [employer] of Occupational Health - Occupational Stress Questionnaire    Feeling of Stress : Not at all  Social Connections: Somewhat Isolated (03/31/2018)   Social Connection and Isolation Panel [NHANES]    Frequency of Communication with Friends and Family: More than three times a week    Frequency of Social Gatherings with Friends and Family: Once a week    Attends Religious Services: 1 to 4 times per year    Active Member of [employer] or Organizations: No    Attends Banker Meetings: Never    Marital Status: Widowed  Intimate Partner Violence: Not At Risk (03/31/2018)   Humiliation, Afraid, Rape, and Kick questionnaire    Fear of Current or Ex-Partner: No    Emotionally Abused:  No    Physically Abused: No    Sexually Abused: No   Family History  Problem Relation Age of Onset   Diabetes Sister    Hypertension Sister       VITAL SIGNS BP 132/74   Pulse 76   Temp 98 F (36.7 C)   Resp 19   Ht 4\' 11"  (1.499 m)   Wt 160 lb (72.6 kg)   SpO2 97%   BMI 32.32 kg/m   Outpatient Encounter Medications as of 11/06/2023  Medication Sig   acetaminophen (TYLENOL) 325 MG tablet Take 650 mg by mouth every 6 (six) hours as needed.   albuterol (PROVENTIL) (2.5 MG/3ML) 0.083% nebulizer solution Take 2.5 mg by nebulization every 4 (four) hours as needed for wheezing or shortness of breath.   NON FORMULARY Diet: Dysphagia 1 (puree) with thin liquids   polyethylene glycol (MIRALAX / GLYCOLAX) 17 g packet  Take 17 g by mouth daily.   polyvinyl alcohol (LIQUIFILM TEARS) 1.4 % ophthalmic solution Place 1 drop into both eyes in the morning and at bedtime. Wait at least 5 minutes between multiple drops in same eye.   No facility-administered encounter medications on file as of 11/06/2023.     SIGNIFICANT DIAGNOSTIC EXAMS  LABS REVIEWED:     11-08-22: vitamin B 12: 256  01-25-23: albumin 3.0    04-18-23: wbc 5.0; hgb 12.6; hct 39.4 mcv 96.6 plt 153; glucose 87; bun 18; creat 0.91; k+ 3.7; na++ 139; ca 8.4 gfr 55; protein 5.5 albumin 2.9 08-16-23: glucose 116; bun 19; creat 1.04 ;k+ 36; na++ 139; ca 9.0; gfr 47    TODAY  10-17-23: wbc 5.1; hgb 12.4; hct 38.9; mcv 96.0 plt 160; glucose 86; bun 19; creat 0.89; k+ 3.7; na++ 140 ; ca 8.6; gfr 57; protein 5.9; albumin 3.1; vitamin B 12: 220  Review of Systems  Constitutional:  Negative for malaise/fatigue.  Respiratory:  Negative for cough and shortness of breath.   Cardiovascular:  Negative for chest pain, palpitations and leg swelling.  Gastrointestinal:  Negative for abdominal pain, constipation and heartburn.  Musculoskeletal:  Negative for back pain, joint pain and myalgias.  Skin: Negative.   Neurological:  Negative for dizziness.  Psychiatric/Behavioral:  The patient is not nervous/anxious.    Physical Exam Constitutional:      General: She is not in acute distress.    Appearance: She is well-developed. She is not diaphoretic.  Neck:     Thyroid: No thyromegaly.  Cardiovascular:     Rate and Rhythm: Normal rate and regular rhythm.     Pulses: Normal pulses.     Heart sounds: Normal heart sounds.  Pulmonary:     Effort: Pulmonary effort is normal. No respiratory distress.     Breath sounds: Normal breath sounds.  Abdominal:     General: Bowel sounds are normal. There is no distension.     Palpations: Abdomen is soft.     Tenderness: There is no abdominal tenderness.  Musculoskeletal:        General: Normal range of motion.      Cervical back: Neck supple.     Right lower leg: No edema.     Left lower leg: No edema.  Lymphadenopathy:     Cervical: No cervical adenopathy.  Skin:    General: Skin is warm and dry.  Neurological:     Mental Status: She is alert. Mental status is at baseline.  Psychiatric:        Mood and  Affect: Mood normal.    ASSESSMENT/ PLAN:  TODAY    Essential hypertension: b/p 132/74  2. Severe protein calorie malnutrition: protein 5.9; albumin 3.1   3. Anemia due to ckd stage 3 hgb 12.4; is off iron   PREVIOUS   4. Aortic atherosclerosis (ct 11-16-17) not on statin due to advanced age.   5. Thrombocytopenia: plt 153  6. Chronic constipation: will continue miralax daily   7. Hyperlipidemia LDL goal <130: not on statin due to advanced age.  8. Chronic kidney disease stage 3a: bun 19; creat 0.89 gfr 57  9. Chronic respiratory failure with hypoxia: will monitor is on room air  10. Severe late onset alzheimer's disease without behavioral disturbance, psychotic disturbance, mood disturbance or anxiety: weight is 160 pounds    Synthia Innocent NP [facility]  call [phone]

## 2023-12-05 ENCOUNTER — Non-Acute Institutional Stay (SKILLED_NURSING_FACILITY): Admitting: Internal Medicine

## 2023-12-05 ENCOUNTER — Encounter: Payer: Self-pay | Admitting: Internal Medicine

## 2023-12-05 DIAGNOSIS — E43 Unspecified severe protein-calorie malnutrition: Secondary | ICD-10-CM | POA: Diagnosis not present

## 2023-12-05 DIAGNOSIS — D649 Anemia, unspecified: Secondary | ICD-10-CM | POA: Diagnosis not present

## 2023-12-05 DIAGNOSIS — I10 Essential (primary) hypertension: Secondary | ICD-10-CM

## 2023-12-05 DIAGNOSIS — N1832 Chronic kidney disease, stage 3b: Secondary | ICD-10-CM | POA: Diagnosis not present

## 2023-12-05 NOTE — Assessment & Plan Note (Signed)
BP controlled; no indication for antihypertensive medications  

## 2023-12-05 NOTE — Assessment & Plan Note (Addendum)
 B12 level is low normal at 220;but indices were normal & anemia has resolved.

## 2023-12-05 NOTE — Assessment & Plan Note (Signed)
 Both total protein and albumin levels have improved with current total protein of 5.9 and albumin of 3.1.  Continue to monitor weights and oral intake.

## 2023-12-05 NOTE — Assessment & Plan Note (Addendum)
 As of 10/17/2023 creatinine had decreased from 1.04 to 0.89 and GFR risen from 47 up to 57 indicating CKD high stage IIIa.  Med list reviewed; no change indicated clinically based on current renal function.

## 2023-12-05 NOTE — Progress Notes (Signed)
 NURSING HOME LOCATION:  [facility] ROOM NUMBER: [id number]  CODE STATUS: DNR   PCP: Sharee Holster, NP   This is a nursing facility follow up visit of chronic medical diagnoses to document compliance with Regulation 483.30 (c) in The Long Term Care Survey Manual Phase 2 which mandates caregiver visit ( visits can alternate among physician, PA or NP as per statutes) within 10 days of 30 days / 60 days/ 90 days post admission to SNF date  .  Interim medical record and care since last SNF visit was updated with review of diagnostic studies and change in clinical status since last visit were documented.  HPI: She is a permanent resident of this facility with medical diagnoses of essential hypertension, allergic rhinitis, dyslipidemia, protein/caloric malnutrition, history of DVT, CKD stage IIIb, degenerative joint disease, reactive airways disease, and dementia. Last month she was treated for influenza A. Most current labs were completed 10/17/2023; she had developed mild hypocalcemia with a value of 8.6.  Total protein had improved from 5.5 up to 5.9 albumin up to 3.1 from 2.9.  CKD had improved from low stage IIIa to high stage IIIa with a GFR of 57, up from a value of 47.  Creatinine had decreased from 1.04 down to 0.89.  She has a history of anemia but CBC was normal.  B12 level was low normal at 220; indices were normal.  She has exhibited minimal hyperglycemia; A1c is not current nor is TSH current.  Review of systems: Dementia invalidated responses.  She denied any active symptoms.  Her first question was "Do you know how old I am?"  When I asked her date of birth she gave me the month but could not give me the day of the month nor the year.  At that point she went on to confabulate about a spring her father had found; this something she relates almost every time I see her.  She asked me what she had been eating when I came in as she could not remember.  Constitutional: No  fever, significant weight change, fatigue  Eyes: No redness, discharge, pain, vision change ENT/mouth: No nasal congestion,  purulent discharge, earache, change in hearing, sore throat  Cardiovascular: No chest pain, palpitations, paroxysmal nocturnal dyspnea, claudication, edema  Respiratory: No cough, sputum production, hemoptysis, DOE, significant snoring, apnea   Gastrointestinal: No heartburn, dysphagia, abdominal pain, nausea /vomiting, rectal bleeding, melena, change in bowels Genitourinary: No dysuria, hematuria, pyuria, incontinence, nocturia Musculoskeletal: No joint stiffness, joint swelling, weakness, pain Dermatologic: No rash, pruritus, change in appearance of skin Neurologic: No dizziness, headache, syncope, seizures, numbness, tingling Psychiatric: No significant anxiety, depression, insomnia, anorexia Endocrine: No change in hair/skin/nails, excessive thirst, excessive hunger, excessive urination  Hematologic/lymphatic: No significant bruising, lymphadenopathy, abnormal bleeding Allergy/immunology: No itchy/watery eyes, significant sneezing, urticaria, angioedema  Physical exam:  Pertinent or positive findings: She appears decades younger than her age of 72.  She is markedly hard of hearing.  Eyebrows are decreased laterally.  She has all her own teeth.  They are coated with food debris at this time.  It appears that her chin has been closely shaved.  She has low-grade rhonchi homogenously.  Heart sounds are distant.  Abdomen is protuberant.  Pedal pulses are not palpable.  She has trace edema at the sock line.  There is some interosseous wasting of the hands suggested. Subtle clubbing of nail beds suggested.  General appearance: Adequately nourished; no acute distress, increased work of breathing  is present.   Lymphatic: No lymphadenopathy about the head, neck, axilla. Eyes: No conjunctival inflammation or lid edema is present. There is no scleral icterus. Ears:  External ear  exam shows no significant lesions or deformities.   Nose:  External nasal examination shows no deformity or inflammation. Nasal mucosa are pink and moist without lesions, exudates Oral exam:  Lips and gums are healthy appearing. There is no oropharyngeal erythema or exudate. Neck:  No thyromegaly, masses, tenderness noted.    Heart:  No gallop, murmur, click, rub .  Lungs:  without wheezes,  rales, rubs. Abdomen: Bowel sounds are normal. Abdomen is soft and nontender with no organomegaly, hernias, masses. GU: Deferred  Extremities:  No cyanosis  Neurologic exam :Balance, Rhomberg, finger to nose testing could not be completed due to clinical state Skin: Warm & dry w/o tenting. No significant lesions or rash.  See summary under each active problem in the Problem List with associated updated therapeutic plan

## 2023-12-05 NOTE — Patient Instructions (Signed)
 See assessment and plan under each diagnosis in the problem list and acutely for this visit

## 2024-01-02 ENCOUNTER — Encounter: Payer: Self-pay | Admitting: Adult Health

## 2024-01-02 ENCOUNTER — Non-Acute Institutional Stay (SKILLED_NURSING_FACILITY): Payer: Self-pay | Admitting: Adult Health

## 2024-01-02 DIAGNOSIS — I700 Atherosclerosis of aorta: Secondary | ICD-10-CM | POA: Diagnosis not present

## 2024-01-02 DIAGNOSIS — K5909 Other constipation: Secondary | ICD-10-CM | POA: Diagnosis not present

## 2024-01-02 DIAGNOSIS — D696 Thrombocytopenia, unspecified: Secondary | ICD-10-CM

## 2024-01-02 NOTE — Progress Notes (Signed)
 Location:  [facility] Room Number: [id number] Place of Service:  SNF (31)   CODE STATUS: dnr   No Known Allergies  Chief Complaint  Patient presents with   Medical Management of Chronic Issues         Aortic atherosclerosis Thrombocytopenia: Chronic constipation:    HPI:  She is a 89 y.o. long term resident of this facility being seen for the management of her chronic illnesses:Aortic atherosclerosis Thrombocytopenia: Chronic constipation. There are no reports of uncontrolled pain. There are no reports constipation; no reports of agitation present.    Past Medical History:  Diagnosis Date   Allergic rhinitis 11/25/2016   Arthritis    mild, no known falls uses cane   Dementia (HCC)    Hypertension    Neurofibroma 05/29/2017   Right breast skin lesion- pathology neurofibroma     Past Surgical History:  Procedure Laterality Date   MASS EXCISION Right 06/07/2017   Procedure: EXCISION SKIN LESION OF RIGHT BREAST;  Surgeon: Awilda Bogus, MD;  Location: [facility];  Service: General;  Laterality: Right;    Social History   Socioeconomic History   Marital status: Widowed    Spouse name: Not on file   Number of children: 0   Years of education: Not on file   Highest education level: Not on file  Occupational History   Not on file  Tobacco Use   Smoking status: Never   Smokeless tobacco: Never  Vaping Use   Vaping status: Never Used  Substance and Sexual Activity   Alcohol use: No   Drug use: No   Sexual activity: Not Currently  Other Topics Concern   Not on file  Social History Narrative   Not on file   Social Drivers of Health   Financial Resource Strain: Low Risk  (03/31/2018)   Overall Financial Resource Strain (CARDIA)    Difficulty of Paying Living Expenses: Not hard at all  Food Insecurity: No Food Insecurity (03/31/2018)   Hunger Vital Sign    Worried About Running Out of Food in the Last Year: Never true    Ran Out of Food in the  Last Year: Never true  Transportation Needs: No Transportation Needs (03/31/2018)   PRAPARE - Administrator, Civil Service (Medical): No    Lack of Transportation (Non-Medical): No  Physical Activity: Inactive (03/31/2018)   Exercise Vital Sign    Days of Exercise per Week: 0 days    Minutes of Exercise per Session: 0 min  Stress: No Stress Concern Present (03/31/2018)   [employer] of Occupational Health - Occupational Stress Questionnaire    Feeling of Stress : Not at all  Social Connections: Somewhat Isolated (03/31/2018)   Social Connection and Isolation Panel [NHANES]    Frequency of Communication with Friends and Family: More than three times a week    Frequency of Social Gatherings with Friends and Family: Once a week    Attends Religious Services: 1 to 4 times per year    Active Member of [employer] or Organizations: No    Attends Banker Meetings: Never    Marital Status: Widowed  Intimate Partner Violence: Not At Risk (03/31/2018)   Humiliation, Afraid, Rape, and Kick questionnaire    Fear of Current or Ex-Partner: No    Emotionally Abused: No    Physically Abused: No    Sexually Abused: No   Family History  Problem Relation Age of Onset   Diabetes Sister  Hypertension Sister       VITAL SIGNS BP 111/67   Pulse 89   Temp (!) 96.4 F (35.8 C)   Resp 18   Ht 4\' 11"  (1.499 m)   Wt 157 lb (71.2 kg)   SpO2 98%   BMI 31.71 kg/m   Outpatient Encounter Medications as of 01/02/2024  Medication Sig   acetaminophen  (TYLENOL ) 325 MG tablet Take 650 mg by mouth every 6 (six) hours as needed.   albuterol  (PROVENTIL ) (2.5 MG/3ML) 0.083% nebulizer solution Take 2.5 mg by nebulization every 4 (four) hours as needed for wheezing or shortness of breath.   NON FORMULARY Diet: Dysphagia 1 (puree) with thin liquids   polyethylene glycol (MIRALAX  / GLYCOLAX ) 17 g packet Take 17 g by mouth daily.   polyvinyl alcohol (LIQUIFILM TEARS) 1.4 % ophthalmic  solution Place 1 drop into both eyes in the morning and at bedtime. Wait at least 5 minutes between multiple drops in same eye.   No facility-administered encounter medications on file as of 01/02/2024.     SIGNIFICANT DIAGNOSTIC EXAMS  LABS REVIEWED:     01-25-23: albumin 3.0    04-18-23: wbc 5.0; hgb 12.6; hct 39.4 mcv 96.6 plt 153; glucose 87; bun 18; creat 0.91; k+ 3.7; na++ 139; ca 8.4 gfr 55; protein 5.5 albumin 2.9 08-16-23: glucose 116; bun 19; creat 1.04 ;k+ 36; na++ 139; ca 9.0; gfr 47   10-17-23: wbc 5.1; hgb 12.4; hct 38.9; mcv 96.0 plt 160; glucose 86; bun 19; creat 0.89; k+ 3.7; na++ 140 ; ca 8.6; gfr 57; protein 5.9; albumin 3.1; vitamin B 12: 220  NO NEW LABS.   Review of Systems  Constitutional:  Negative for malaise/fatigue.  Respiratory:  Negative for cough and shortness of breath.   Cardiovascular:  Negative for chest pain, palpitations and leg swelling.  Gastrointestinal:  Negative for abdominal pain, constipation and heartburn.  Musculoskeletal:  Negative for back pain, joint pain and myalgias.  Skin: Negative.   Neurological:  Negative for dizziness.  Psychiatric/Behavioral:  The patient is not nervous/anxious.    Physical Exam Constitutional:      General: She is not in acute distress.    Appearance: She is well-developed. She is not diaphoretic.  Neck:     Thyroid : No thyromegaly.  Cardiovascular:     Rate and Rhythm: Normal rate and regular rhythm.     Pulses: Normal pulses.     Heart sounds: Normal heart sounds.  Pulmonary:     Effort: Pulmonary effort is normal. No respiratory distress.     Breath sounds: Normal breath sounds.  Abdominal:     General: Bowel sounds are normal. There is no distension.     Palpations: Abdomen is soft.     Tenderness: There is no abdominal tenderness.  Musculoskeletal:        General: Normal range of motion.     Cervical back: Neck supple.     Right lower leg: No edema.     Left lower leg: No edema.   Lymphadenopathy:     Cervical: No cervical adenopathy.  Skin:    General: Skin is warm and dry.  Neurological:     Mental Status: She is alert. Mental status is at baseline.  Psychiatric:        Mood and Affect: Mood normal.     ASSESSMENT/ PLAN:  TODAY    Aortic atherosclerosis (ct 11-16-17); is not on statin due to advanced age  25. Thrombocytopenia: plt 153  3.  Chronic constipation: will continue miralax  daily   PREVIOUS   4. Hyperlipidemia LDL goal <130: not on statin due to advanced age.  5. Chronic kidney disease stage 3a: bun 19; creat 0.89 gfr 57  6. Chronic respiratory failure with hypoxia: will monitor is on room air  7. Severe late onset alzheimer's disease without behavioral disturbance, psychotic disturbance, mood disturbance or anxiety: weight is 135 pounds   8. Essential hypertension: b/p 122/75  9. Severe protein calorie malnutrition: protein 5.9; albumin 3.1   10. Anemia due to ckd stage 3 hgb 12.4; is off iron    Dejanique Ruehl NP [facility]  [phone]

## 2024-01-08 ENCOUNTER — Encounter: Payer: Self-pay | Admitting: Adult Health

## 2024-01-08 ENCOUNTER — Non-Acute Institutional Stay (SKILLED_NURSING_FACILITY): Payer: Self-pay | Admitting: Adult Health

## 2024-01-08 DIAGNOSIS — Z0000 Encounter for general adult medical examination without abnormal findings: Secondary | ICD-10-CM

## 2024-01-08 NOTE — Progress Notes (Signed)
 Subjective:   Katrina Berry is a 89 y.o. female who presents for Medicare Annual (Subsequent) preventive examination.  Visit Complete: In person  Patient Medicare AWV questionnaire was completed by the patient on 01-08-2024; I have confirmed that all information answered by patient is correct and no changes since this date.  Cardiac Risk Factors include: advanced age (>26men, >80 women);hypertension;obesity (BMI >30kg/m2);sedentary lifestyle     Objective:     Today's Vitals   01/08/24 1115  BP: 122/62  Pulse: 78  Resp: 18  Temp: 98.1 F (36.7 C)  SpO2: 97%  Height: 4\' 11"  (1.499 m)   Body mass index is 31.71 kg/m.     01/08/2024   11:21 AM 10/17/2023   12:41 PM 05/08/2023   11:25 AM 04/17/2023   11:05 AM 02/05/2023   10:23 AM 01/25/2023   10:58 AM 01/10/2023    1:27 PM  Advanced Directives  Does Patient Have a Medical Advance Directive? No Yes Yes Yes Yes Yes Yes  Type of Advance Directive  Out of facility DNR (pink MOST or yellow form) Out of facility DNR (pink MOST or yellow form) Out of facility DNR (pink MOST or yellow form) Out of facility DNR (pink MOST or yellow form) Out of facility DNR (pink MOST or yellow form) Out of facility DNR (pink MOST or yellow form)  Does patient want to make changes to medical advance directive?  No - Patient declined No - Patient declined No - Patient declined No - Patient declined No - Patient declined No - Patient declined  Would patient like information on creating a medical advance directive? No - Patient declined        Pre-existing out of facility DNR order (yellow form or pink MOST form)  Pink MOST/Yellow Form most recent copy in chart - Physician notified to receive inpatient order Yellow form placed in chart (order not valid for inpatient use);Pink MOST form placed in chart (order not valid for inpatient use) Yellow form placed in chart (order not valid for inpatient use);Pink MOST form placed in chart (order not valid for  inpatient use) Pink MOST form placed in chart (order not valid for inpatient use);Yellow form placed in chart (order not valid for inpatient use) Pink MOST form placed in chart (order not valid for inpatient use);Yellow form placed in chart (order not valid for inpatient use) Pink MOST form placed in chart (order not valid for inpatient use);Yellow form placed in chart (order not valid for inpatient use)    Current Medications (verified) Outpatient Encounter Medications as of 01/08/2024  Medication Sig   acetaminophen  (TYLENOL ) 325 MG tablet Take 650 mg by mouth every 6 (six) hours as needed.   albuterol  (PROVENTIL ) (2.5 MG/3ML) 0.083% nebulizer solution Take 2.5 mg by nebulization every 4 (four) hours as needed for wheezing or shortness of breath.   NON FORMULARY Diet: Dysphagia 1 (puree) with thin liquids   polyethylene glycol (MIRALAX  / GLYCOLAX ) 17 g packet Take 17 g by mouth daily.   polyvinyl alcohol (LIQUIFILM TEARS) 1.4 % ophthalmic solution Place 1 drop into both eyes in the morning and at bedtime. Wait at least 5 minutes between multiple drops in same eye.   No facility-administered encounter medications on file as of 01/08/2024.    Allergies (verified) Patient has no known allergies.   History: Past Medical History:  Diagnosis Date   Allergic rhinitis 11/25/2016   Anemia    Arthritis    mild, no known falls uses cane  Dementia (HCC)    Dementia of the Alzheimer's type, with late onset, with delirium (HCC) 12/08/2021   Hypertension    Neurofibroma 05/29/2017   Right breast skin lesion- pathology neurofibroma    Past Surgical History:  Procedure Laterality Date   MASS EXCISION Right 06/07/2017   Procedure: EXCISION SKIN LESION OF RIGHT BREAST;  Surgeon: Awilda Bogus, MD;  Location: [facility];  Service: General;  Laterality: Right;   Family History  Problem Relation Age of Onset   Diabetes Sister    Hypertension Sister    Social History   Socioeconomic History    Marital status: Widowed    Spouse name: Not on file   Number of children: 0   Years of education: Not on file   Highest education level: Not on file  Occupational History   Not on file  Tobacco Use   Smoking status: Never   Smokeless tobacco: Never  Vaping Use   Vaping status: Never Used  Substance and Sexual Activity   Alcohol use: No   Drug use: No   Sexual activity: Not Currently  Other Topics Concern   Not on file  Social History Narrative   Not on file   Social Drivers of Health   Financial Resource Strain: Low Risk  (03/31/2018)   Overall Financial Resource Strain (CARDIA)    Difficulty of Paying Living Expenses: Not hard at all  Food Insecurity: No Food Insecurity (03/31/2018)   Hunger Vital Sign    Worried About Running Out of Food in the Last Year: Never true    Ran Out of Food in the Last Year: Never true  Transportation Needs: No Transportation Needs (03/31/2018)   PRAPARE - Administrator, Civil Service (Medical): No    Lack of Transportation (Non-Medical): No  Physical Activity: Inactive (03/31/2018)   Exercise Vital Sign    Days of Exercise per Week: 0 days    Minutes of Exercise per Session: 0 min  Stress: No Stress Concern Present (03/31/2018)   [employer] of Occupational Health - Occupational Stress Questionnaire    Feeling of Stress : Not at all  Social Connections: Somewhat Isolated (03/31/2018)   Social Connection and Isolation Panel [NHANES]    Frequency of Communication with Friends and Family: More than three times a week    Frequency of Social Gatherings with Friends and Family: Once a week    Attends Religious Services: 1 to 4 times per year    Active Member of [employer] or Organizations: No    Attends Banker Meetings: Never    Marital Status: Widowed    Tobacco Counseling Counseling given: Not Answered   Clinical Intake:  Pre-visit preparation completed: Yes  Pain : No/denies pain     BMI - recorded:  31.71 Nutritional Status: BMI > 30  Obese Nutritional Risks: Unintentional weight loss, Failure to thrive Diabetes: No  How often do you need to have someone help you when you read instructions, pamphlets, or other written materials from your doctor or pharmacy?: 5 - Always  Interpreter Needed?: No  Comments: long term resident of SNF   Activities of Daily Living    01/08/2024    1:50 PM  In your present state of health, do you have any difficulty performing the following activities:  Hearing? 0  Vision? 0  Difficulty concentrating or making decisions? 1  Walking or climbing stairs? 1  Dressing or bathing? 1  Doing errands, shopping? 1  Preparing Food and  eating ? Y  Using the Toilet? Y  In the past six months, have you accidently leaked urine? Y  Do you have problems with loss of bowel control? Y  Managing your Medications? Y  Managing your Finances? Y  Housekeeping or managing your Housekeeping? Y    Patient Care Team: Marilyne Shu, NP as PCP - [facility] ([facility]  Indicate any recent Medical Services you may have received from other than [facility] providers in the past year (date may be approximate).     Assessment:    This is a routine wellness examination for [place].  Hearing/Vision screen No results found.   Goals Addressed             This Visit's Progress    Absence of Fall and Fall-Related Injury   On track    Evidence-based guidance:  Assess fall risk using a validated tool when available. Consider balance and gait impairment, muscle weakness, diminished vision or hearing, environmental hazards, presence of urinary or bowel urgency and/or incontinence.  Communicate fall injury risk to interprofessional healthcare team.  Develop a fall prevention plan with the patient and family.  Promote use of personal vision and auditory aids.  Promote reorientation, appropriate sensory stimulation, and  routines to decrease risk of fall when changes in mental status are present.  Assess assistance level required for safe and effective self-care; consider referral for home care.  Encourage physical activity, such as performance of self-care at highest level of ability, strength and balance exercise program, and provision of appropriate assistive devices; refer to rehabilitation therapy.  Refer to community-based fall prevention program where available.  If fall occurs, determine the cause and revise fall injury prevention plan.  Regularly review medication contribution to fall risk; consider risk related to polypharmacy and age.  Refer to pharmacist for consultation when concerns about medications are revealed.  Balance adequate pain management with potential for oversedation.  Provide guidance related to environmental modifications.  Consider supplementation with Vitamin D .   Notes:      DIET - INCREASE WATER INTAKE   On track    General - Client will not be readmitted within 30 days ([facility])   On track      Depression Screen    01/02/2024   10:50 AM 01/07/2023   10:55 AM 11/05/2022   10:02 AM 10/03/2022   11:17 AM 09/27/2022    9:22 AM 12/27/2021   12:03 PM 06/08/2020    1:26 PM  PHQ 2/9 Scores  PHQ - 2 Score 0  0 0 0  0  PHQ- 9 Score   0 0     Exception Documentation  Other- indicate reason in comment box    Other- indicate reason in comment box   Not completed  unable to participate    dementia     Fall Risk    01/02/2024   10:49 AM 01/07/2023   10:55 AM 11/05/2022   10:02 AM 09/27/2022    9:22 AM 12/27/2021   12:03 PM  Fall Risk   Falls in the past year? 1 1 0 0 0  Number falls in past yr: 0 0 0 0 0  Injury with Fall? 0 0 0 0 0  Risk for fall due to : History of fall(s);Impaired balance/gait;Impaired mobility History of fall(s);Impaired balance/gait;Impaired mobility No Fall Risks No Fall Risks Impaired balance/gait;Impaired mobility  Follow up  Falls evaluation completed Falls  evaluation completed  MEDICARE RISK AT HOME: Medicare Risk at Home Any stairs in or around the home?: Yes If so, are there any without handrails?: No Home free of loose throw rugs in walkways, pet beds, electrical cords, etc?: Yes Adequate lighting in your home to reduce risk of falls?: Yes Life alert?: No Use of a cane, walker or w/c?: Yes Grab bars in the bathroom?: Yes Shower chair or bench in shower?: Yes Elevated toilet seat or a handicapped toilet?: Yes  TIMED UP AND GO:  Was the test performed?  No    Cognitive Function:    01/08/2024    1:51 PM 01/07/2023   10:55 AM 12/27/2021   12:04 PM 07/07/2015    2:43 PM  MMSE - Mini Mental State Exam  Not completed: Unable to complete Unable to complete Unable to complete   Orientation to time    2  Orientation to Place    2  Registration    3  Attention/ Calculation    5  Recall    0  Language- name 2 objects    2  Language- repeat    0  Language- follow 3 step command    3  Language- read & follow direction    1  Write a sentence    1  Copy design    0  Total score    19        03/31/2018    1:38 PM  6CIT Screen  What Year? 4 points  What month? 3 points  What time? 3 points  Count back from 20 4 points  Months in reverse 4 points  Repeat phrase 6 points  Total Score 24 points    Immunizations Immunization History  Administered Date(s) Administered   Fluad Quad(high Dose 65+) 04/29/2019, 06/08/2020   Fluad Trivalent(High Dose 65+) 06/17/2023   Influenza,inj,Quad PF,6+ Mos 07/07/2015, 05/10/2016, 05/29/2017, 06/25/2018   Influenza-Unspecified 06/02/2021, 05/24/2022   Moderna Covid-19 Vaccine  Bivalent Booster 72yrs & up 03/16/2021, 05/28/2023   Moderna SARS-COV2 Booster Vaccination 11/16/2020, 08/02/2021   Moderna Sars-Covid-2 Vaccination 10/04/2019, 11/01/2019   PNEUMOCOCCAL CONJUGATE-20 11/30/2021   Pneumococcal Conjugate-13 12/25/2016   Pneumococcal Polysaccharide-23 06/25/2018   Rsv, Bivalent,  Protein Subunit Rsvpref,pf Pattricia Bores) 07/23/2022   Tdap 07/07/2015   Zoster Recombinant(Shingrix) 12/15/2021, 03/14/2022    TDAP status: Up to date  Flu Vaccine status: Up to date  Pneumococcal vaccine status: Up to date  Covid-19 vaccine status: Completed vaccines  Qualifies for Shingles Vaccine? No   Zostavax completed Yes   Shingrix Completed?: No.    Education has been provided regarding the importance of this vaccine. Patient has been advised to call insurance company to determine out of pocket expense if they have not yet received this vaccine. Advised may also receive vaccine at local pharmacy or Health Dept. Verbalized acceptance and understanding.  Screening Tests Health Maintenance  Topic Date Due   INFLUENZA VACCINE  03/20/2024   Medicare Annual Wellness (AWV)  01/07/2025   DTaP/Tdap/Td (2 - Td or Tdap) 07/06/2025   Pneumonia Vaccine 30+ Years old  Completed   Zoster Vaccines- Shingrix  Completed   HPV VACCINES  Aged Out   Meningococcal B Vaccine  Aged Out   DEXA SCAN  Discontinued   COVID-19 Vaccine  Discontinued    Health Maintenance  There are no preventive care reminders to display for this patient.   Colorectal cancer screening: No longer required.   Mammogram status: No longer required due to age.  Bone Density status: Completed  2023. Results reflect: Bone density results: OSTEOPOROSIS. Repeat every   years.  Lung Cancer Screening: (Low Dose CT Chest recommended if Age 13-80 years, 20 pack-year currently smoking OR have quit w/in 15years.) does not qualify.   Lung Cancer Screening Referral:   Additional Screening:  Hepatitis C Screening: does not qualify; Completed   Vision Screening: Recommended annual ophthalmology exams for early detection of glaucoma and other disorders of the eye. Is the patient up to date with their annual eye exam?  No  Who is the provider or what is the name of the office in which the patient attends annual eye exams?  If  pt is not established with a provider, would they like to be referred to a provider to establish care? No .   Dental Screening: Recommended annual dental exams for proper oral hygiene  Diabetic Foot Exam:   Community Resource Referral / Chronic Care Management: CRR required this visit?  No   CCM required this visit?  No     Plan:     I have personally reviewed and noted the following in the patient's chart:   Medical and social history Use of alcohol, tobacco or illicit drugs  Current medications and supplements including opioid prescriptions. Patient is not currently taking opioid prescriptions. Functional ability and status Nutritional status Physical activity Advanced directives List of other physicians Hospitalizations, surgeries, and ER visits in previous 12 months Vitals Screenings to include cognitive, depression, and falls Referrals and appointments  In addition, I have reviewed and discussed with patient certain preventive protocols, quality metrics, and best practice recommendations. A written personalized care plan for preventive services as well as general preventive health recommendations were provided to patient.     Marilyne Shu, NP   01/08/2024   After Visit Summary: (In Person-Declined) Patient declined AVS at this time.  Nurse Notes: this exam was performed by myself at this facility

## 2024-01-08 NOTE — Patient Instructions (Signed)
  Katrina Berry , Thank you for taking time to come for your Medicare Wellness Visit. I appreciate your ongoing commitment to your health goals. Please review the following plan we discussed and let me know if I can assist you in the future.   These are the goals we discussed:  Goals      Absence of Fall and Fall-Related Injury     Evidence-based guidance:  Assess fall risk using a validated tool when available. Consider balance and gait impairment, muscle weakness, diminished vision or hearing, environmental hazards, presence of urinary or bowel urgency and/or incontinence.  Communicate fall injury risk to interprofessional healthcare team.  Develop a fall prevention plan with the patient and family.  Promote use of personal vision and auditory aids.  Promote reorientation, appropriate sensory stimulation, and routines to decrease risk of fall when changes in mental status are present.  Assess assistance level required for safe and effective self-care; consider referral for home care.  Encourage physical activity, such as performance of self-care at highest level of ability, strength and balance exercise program, and provision of appropriate assistive devices; refer to rehabilitation therapy.  Refer to community-based fall prevention program where available.  If fall occurs, determine the cause and revise fall injury prevention plan.  Regularly review medication contribution to fall risk; consider risk related to polypharmacy and age.  Refer to pharmacist for consultation when concerns about medications are revealed.  Balance adequate pain management with potential for oversedation.  Provide guidance related to environmental modifications.  Consider supplementation with Vitamin D .   Notes:      DIET - INCREASE WATER INTAKE     General - Client will not be readmitted within 30 days ([facility])        This is a list of the screening recommended for you and due dates:  Health Maintenance  Topic  Date Due   Flu Shot  03/20/2024   Medicare Annual Wellness Visit  01/07/2025   DTaP/Tdap/Td vaccine (2 - Td or Tdap) 07/06/2025   Pneumonia Vaccine  Completed   Zoster (Shingles) Vaccine  Completed   HPV Vaccine  Aged Out   Meningitis B Vaccine  Aged Out   DEXA scan (bone density measurement)  Discontinued   COVID-19 Vaccine  Discontinued

## 2024-01-16 ENCOUNTER — Encounter: Payer: Self-pay | Admitting: Adult Health

## 2024-01-16 NOTE — Progress Notes (Unsigned)
 Location:  [facility] Room Number: [id number] Place of Service:  SNF (31)   CODE STATUS: dnr   No Known Allergies  Chief Complaint  Patient presents with   Acute Visit    Care plan meeting.     HPI:  We have come together for her care plan meeting. *** BIMS mood. She uses wheel chair *** falls. She requires***. She is***. Dietary: ***. Therapy: ***. She will continue to be followed for her chronic illnesses including: Severe protein calorie malnutrition  Thrombocytopenia  Severe late onset alzheimer's disease without behavioral disturbance, psychotic disturbance, mood disturbance or anxiety  Past Medical History:  Diagnosis Date   Allergic rhinitis 11/25/2016   Anemia    Arthritis    mild, no known falls uses cane   Dementia (HCC)    Dementia of the Alzheimer's type, with late onset, with delirium (HCC) 12/08/2021   Hypertension    Neurofibroma 05/29/2017   Right breast skin lesion- pathology neurofibroma     Past Surgical History:  Procedure Laterality Date   MASS EXCISION Right 06/07/2017   Procedure: EXCISION SKIN LESION OF RIGHT BREAST;  Surgeon: Awilda Bogus, MD;  Location: [facility];  Service: General;  Laterality: Right;    Social History   Socioeconomic History   Marital status: Widowed    Spouse name: Not on file   Number of children: 0   Years of education: Not on file   Highest education level: Not on file  Occupational History   Not on file  Tobacco Use   Smoking status: Never   Smokeless tobacco: Never  Vaping Use   Vaping status: Never Used  Substance and Sexual Activity   Alcohol use: No   Drug use: No   Sexual activity: Not Currently  Other Topics Concern   Not on file  Social History Narrative   Not on file   Social Drivers of Health   Financial Resource Strain: Low Risk  (03/31/2018)   Overall Financial Resource Strain (CARDIA)    Difficulty of Paying Living Expenses: Not hard at all  Food Insecurity: No Food  Insecurity (03/31/2018)   Hunger Vital Sign    Worried About Running Out of Food in the Last Year: Never true    Ran Out of Food in the Last Year: Never true  Transportation Needs: No Transportation Needs (03/31/2018)   PRAPARE - Administrator, Civil Service (Medical): No    Lack of Transportation (Non-Medical): No  Physical Activity: Inactive (03/31/2018)   Exercise Vital Sign    Days of Exercise per Week: 0 days    Minutes of Exercise per Session: 0 min  Stress: No Stress Concern Present (03/31/2018)   [employer] of Occupational Health - Occupational Stress Questionnaire    Feeling of Stress : Not at all  Social Connections: Somewhat Isolated (03/31/2018)   Social Connection and Isolation Panel [NHANES]    Frequency of Communication with Friends and Family: More than three times a week    Frequency of Social Gatherings with Friends and Family: Once a week    Attends Religious Services: 1 to 4 times per year    Active Member of [employer] or Organizations: No    Attends Banker Meetings: Never    Marital Status: Widowed  Intimate Partner Violence: Not At Risk (03/31/2018)   Humiliation, Afraid, Rape, and Kick questionnaire    Fear of Current or Ex-Partner: No    Emotionally Abused: No  Physically Abused: No    Sexually Abused: No   Family History  Problem Relation Age of Onset   Diabetes Sister    Hypertension Sister       VITAL SIGNS BP (!) 142/59   Pulse 95   Temp 98.6 F (37 C)   Resp 18   Ht 4\' 11"  (1.499 m)   Wt 157 lb (71.2 kg)   SpO2 94%   BMI 31.71 kg/m   Outpatient Encounter Medications as of 01/17/2024  Medication Sig   acetaminophen  (TYLENOL ) 325 MG tablet Take 650 mg by mouth every 6 (six) hours as needed.   albuterol  (PROVENTIL ) (2.5 MG/3ML) 0.083% nebulizer solution Take 2.5 mg by nebulization every 4 (four) hours as needed for wheezing or shortness of breath.   NON FORMULARY Diet: Dysphagia 1 (puree) with thin liquids    polyethylene glycol (MIRALAX  / GLYCOLAX ) 17 g packet Take 17 g by mouth daily.   polyvinyl alcohol (LIQUIFILM TEARS) 1.4 % ophthalmic solution Place 1 drop into both eyes in the morning and at bedtime. Wait at least 5 minutes between multiple drops in same eye.   No facility-administered encounter medications on file as of 01/17/2024.     SIGNIFICANT DIAGNOSTIC EXAMS  LABS REVIEWED:     01-25-23: albumin 3.0    04-18-23: wbc 5.0; hgb 12.6; hct 39.4 mcv 96.6 plt 153; glucose 87; bun 18; creat 0.91; k+ 3.7; na++ 139; ca 8.4 gfr 55; protein 5.5 albumin 2.9 08-16-23: glucose 116; bun 19; creat 1.04 ;k+ 36; na++ 139; ca 9.0; gfr 47   10-17-23: wbc 5.1; hgb 12.4; hct 38.9; mcv 96.0 plt 160; glucose 86; bun 19; creat 0.89; k+ 3.7; na++ 140 ; ca 8.6; gfr 57; protein 5.9; albumin 3.1; vitamin B 12: 220  NO NEW LABS.   Review of Systems  Constitutional:  Negative for malaise/fatigue.  Respiratory:  Negative for cough and shortness of breath.   Cardiovascular:  Negative for chest pain, palpitations and leg swelling.  Gastrointestinal:  Negative for abdominal pain, constipation and heartburn.  Musculoskeletal:  Negative for back pain, joint pain and myalgias.  Skin: Negative.   Neurological:  Negative for dizziness.  Psychiatric/Behavioral:  The patient is not nervous/anxious.    Physical Exam Constitutional:      General: She is not in acute distress.    Appearance: She is well-developed. She is not diaphoretic.  Neck:     Thyroid : No thyromegaly.  Cardiovascular:     Rate and Rhythm: Normal rate and regular rhythm.     Pulses: Normal pulses.     Heart sounds: Normal heart sounds.  Pulmonary:     Effort: Pulmonary effort is normal. No respiratory distress.     Breath sounds: Normal breath sounds.  Abdominal:     General: Bowel sounds are normal. There is no distension.     Palpations: Abdomen is soft.     Tenderness: There is no abdominal tenderness.  Musculoskeletal:        General:  Normal range of motion.     Cervical back: Neck supple.     Right lower leg: No edema.     Left lower leg: No edema.  Lymphadenopathy:     Cervical: No cervical adenopathy.  Skin:    General: Skin is warm and dry.  Neurological:     Mental Status: She is alert. Mental status is at baseline.  Psychiatric:        Mood and Affect: Mood normal.    ASSESSMENT/  PLAN:  TODAY  Severe protein calorie malnutrition Thrombocytopenia Severe late onset alzheimer's disease without behavioral disturbance, psychotic disturbance, mood disturbance or anxiety  Will continue current medications Will continue current plan of care Will continue to monitor her status.   Time spent with patient: 40 minutes: medications; dietary; plan of care.    Britt Candle NP [facility]  call [phone]

## 2024-01-17 ENCOUNTER — Non-Acute Institutional Stay (SKILLED_NURSING_FACILITY): Payer: Self-pay | Admitting: Adult Health

## 2024-01-17 DIAGNOSIS — F02C0 Dementia in other diseases classified elsewhere, severe, without behavioral disturbance, psychotic disturbance, mood disturbance, and anxiety: Secondary | ICD-10-CM | POA: Diagnosis not present

## 2024-01-17 DIAGNOSIS — G301 Alzheimer's disease with late onset: Secondary | ICD-10-CM | POA: Diagnosis not present

## 2024-01-17 DIAGNOSIS — D696 Thrombocytopenia, unspecified: Secondary | ICD-10-CM

## 2024-01-17 DIAGNOSIS — E43 Unspecified severe protein-calorie malnutrition: Secondary | ICD-10-CM

## 2024-02-04 ENCOUNTER — Encounter: Payer: Self-pay | Admitting: Adult Health

## 2024-02-04 ENCOUNTER — Non-Acute Institutional Stay (SKILLED_NURSING_FACILITY): Payer: Self-pay | Admitting: Adult Health

## 2024-02-04 DIAGNOSIS — J9611 Chronic respiratory failure with hypoxia: Secondary | ICD-10-CM | POA: Diagnosis not present

## 2024-02-04 DIAGNOSIS — E785 Hyperlipidemia, unspecified: Secondary | ICD-10-CM | POA: Diagnosis not present

## 2024-02-04 DIAGNOSIS — N1832 Chronic kidney disease, stage 3b: Secondary | ICD-10-CM

## 2024-02-04 NOTE — Progress Notes (Unsigned)
 Location:  [facility] Room Number: [id number] Place of Service:  SNF (31)   CODE STATUS: dnr   No Known Allergies  Chief Complaint  Patient presents with   Medical Management of Chronic Issues        Hyperlipidemia LDL goal <130:     Chronic kidney disease stage 3a:  Chronic respiratory failure with hypoxia    HPI:  She is a 89 y.o. long term resident of this facility being seen for the management of her chronic illnesses: Hyperlipidemia LDL goal <130:     Chronic kidney disease stage 3a:  Chronic respiratory failure with hypoxia. There are no reports of uncontrolled pain. She is out of bed daily. There are no reports of changes in appetite.    Past Medical History:  Diagnosis Date   Allergic rhinitis 11/25/2016   Anemia    Arthritis    mild, no known falls uses cane   Dementia (HCC)    Dementia of the Alzheimer's type, with late onset, with delirium (HCC) 12/08/2021   Hypertension    Neurofibroma 05/29/2017   Right breast skin lesion- pathology neurofibroma     Past Surgical History:  Procedure Laterality Date   MASS EXCISION Right 06/07/2017   Procedure: EXCISION SKIN LESION OF RIGHT BREAST;  Surgeon: Awilda Bogus, MD;  Location: [facility];  Service: General;  Laterality: Right;    Social History   Socioeconomic History   Marital status: Widowed    Spouse name: Not on file   Number of children: 0   Years of education: Not on file   Highest education level: Not on file  Occupational History   Not on file  Tobacco Use   Smoking status: Never   Smokeless tobacco: Never  Vaping Use   Vaping status: Never Used  Substance and Sexual Activity   Alcohol use: No   Drug use: No   Sexual activity: Not Currently  Other Topics Concern   Not on file  Social History Narrative   Not on file   Social Drivers of Health   Financial Resource Strain: Low Risk  (03/31/2018)   Overall Financial Resource Strain (CARDIA)    Difficulty of Paying Living  Expenses: Not hard at all  Food Insecurity: No Food Insecurity (03/31/2018)   Hunger Vital Sign    Worried About Running Out of Food in the Last Year: Never true    Ran Out of Food in the Last Year: Never true  Transportation Needs: No Transportation Needs (03/31/2018)   PRAPARE - Administrator, Civil Service (Medical): No    Lack of Transportation (Non-Medical): No  Physical Activity: Inactive (03/31/2018)   Exercise Vital Sign    Days of Exercise per Week: 0 days    Minutes of Exercise per Session: 0 min  Stress: No Stress Concern Present (03/31/2018)   [employer] of Occupational Health - Occupational Stress Questionnaire    Feeling of Stress : Not at all  Social Connections: Somewhat Isolated (03/31/2018)   Social Connection and Isolation Panel    Frequency of Communication with Friends and Family: More than three times a week    Frequency of Social Gatherings with Friends and Family: Once a week    Attends Religious Services: 1 to 4 times per year    Active Member of [employer] or Organizations: No    Attends Banker Meetings: Never    Marital Status: Widowed  Intimate Partner Violence: Not At Risk (03/31/2018)  Humiliation, Afraid, Rape, and Kick questionnaire    Fear of Current or Ex-Partner: No    Emotionally Abused: No    Physically Abused: No    Sexually Abused: No   Family History  Problem Relation Age of Onset   Diabetes Sister    Hypertension Sister       VITAL SIGNS BP 126/60   Pulse (!) 57   Temp 97.9 F (36.6 C)   Resp 20   Ht 4' 11 (1.499 m)   Wt 158 lb (71.7 kg)   SpO2 97%   BMI 31.91 kg/m   Outpatient Encounter Medications as of 02/04/2024  Medication Sig   acetaminophen  (TYLENOL ) 325 MG tablet Take 650 mg by mouth every 6 (six) hours as needed.   albuterol  (PROVENTIL ) (2.5 MG/3ML) 0.083% nebulizer solution Take 2.5 mg by nebulization every 4 (four) hours as needed for wheezing or shortness of breath.   NON FORMULARY Diet:  Dysphagia 1 (puree) with thin liquids   polyethylene glycol (MIRALAX  / GLYCOLAX ) 17 g packet Take 17 g by mouth daily.   polyvinyl alcohol (LIQUIFILM TEARS) 1.4 % ophthalmic solution Place 1 drop into both eyes in the morning and at bedtime. Wait at least 5 minutes between multiple drops in same eye.   No facility-administered encounter medications on file as of 02/04/2024.     SIGNIFICANT DIAGNOSTIC EXAMS  LABS REVIEWED:     01-25-23: albumin 3.0    04-18-23: wbc 5.0; hgb 12.6; hct 39.4 mcv 96.6 plt 153; glucose 87; bun 18; creat 0.91; k+ 3.7; na++ 139; ca 8.4 gfr 55; protein 5.5 albumin 2.9 08-16-23: glucose 116; bun 19; creat 1.04 ;k+ 36; na++ 139; ca 9.0; gfr 47   10-17-23: wbc 5.1; hgb 12.4; hct 38.9; mcv 96.0 plt 160; glucose 86; bun 19; creat 0.89; k+ 3.7; na++ 140 ; ca 8.6; gfr 57; protein 5.9; albumin 3.1; vitamin B 12: 220  NO NEW LABS.   Review of Systems  Constitutional:  Negative for malaise/fatigue.  Respiratory:  Negative for cough and shortness of breath.   Cardiovascular:  Negative for chest pain, palpitations and leg swelling.  Gastrointestinal:  Negative for abdominal pain, constipation and heartburn.  Musculoskeletal:  Negative for back pain, joint pain and myalgias.  Skin: Negative.   Neurological:  Negative for dizziness.  Psychiatric/Behavioral:  The patient is not nervous/anxious.    Physical Exam Constitutional:      General: She is not in acute distress.    Appearance: She is well-developed and overweight. She is not diaphoretic.  Neck:     Thyroid : No thyromegaly.   Cardiovascular:     Rate and Rhythm: Normal rate and regular rhythm.     Pulses: Normal pulses.     Heart sounds: Normal heart sounds.  Pulmonary:     Effort: Pulmonary effort is normal. No respiratory distress.     Breath sounds: Normal breath sounds.  Abdominal:     General: Bowel sounds are normal. There is no distension.     Palpations: Abdomen is soft.     Tenderness: There is no  abdominal tenderness.   Musculoskeletal:        General: Normal range of motion.     Cervical back: Neck supple.     Right lower leg: No edema.     Left lower leg: No edema.  Lymphadenopathy:     Cervical: No cervical adenopathy.   Skin:    General: Skin is warm and dry.   Neurological:  Mental Status: She is alert. Mental status is at baseline.   Psychiatric:        Mood and Affect: Mood normal.       ASSESSMENT/ PLAN:  TODAY   Hyperlipidemia LDL goal <130: not on statin due to advanced age  64. Chronic kidney disease stage 3a: bun 19; creat 0.89; gfr 57  3. Chronic respiratory failure with hypoxia: is on room air will monitor    PREVIOUS   4. Severe late onset alzheimer's disease without behavioral disturbance, psychotic disturbance, mood disturbance or anxiety: weight is 158 pounds   5. Essential hypertension: b/p 126/60  6. Severe protein calorie malnutrition: protein 5.9; albumin 3.1   7. Anemia due to ckd stage 3 hgb 12.4; is off iron   8. Aortic atherosclerosis (ct 11-16-17); is not on statin due to advanced age  622. Thrombocytopenia: plt 153  10. Chronic constipation: will continue miralax  daily    Britt Candle NP [facility]  call [phone]

## 2024-03-19 ENCOUNTER — Encounter: Payer: Self-pay | Admitting: Adult Health

## 2024-03-19 ENCOUNTER — Non-Acute Institutional Stay (SKILLED_NURSING_FACILITY): Payer: Self-pay | Admitting: Adult Health

## 2024-03-19 DIAGNOSIS — I700 Atherosclerosis of aorta: Secondary | ICD-10-CM | POA: Diagnosis not present

## 2024-03-19 DIAGNOSIS — J9611 Chronic respiratory failure with hypoxia: Secondary | ICD-10-CM | POA: Diagnosis not present

## 2024-03-19 DIAGNOSIS — F02C0 Dementia in other diseases classified elsewhere, severe, without behavioral disturbance, psychotic disturbance, mood disturbance, and anxiety: Secondary | ICD-10-CM

## 2024-03-19 DIAGNOSIS — G301 Alzheimer's disease with late onset: Secondary | ICD-10-CM | POA: Diagnosis not present

## 2024-03-19 NOTE — Progress Notes (Signed)
 Location:  [facility] Room Number: 149-D Place of Service:  SNF (31)   CODE STATUS: dnr   No Known Allergies  Chief Complaint  Patient presents with   Care plan meeting    Acute visit.    HPI:  We have come together for her care plan meeting. Family present. BIMS 3/15 mood 0/30. She uses wheelchair without falls. She is dependent assist for her adl care. She is frequently incontinent of bladder and bowel. Dietary: she requires setup for meals D1 diet appetite 0-100% weight is 153 pounds and is stable. Therapy: none at this time. Activities: is sociable. She continues to be followed by hospice care. She will continue to be followed for her chronic illnesses including:   Aortic atherosclerosis   Chronic respiratory failure with hypoxia   Severe late onset alzheimer's disease without behavioral disturbance, no psychotic disturbance, mood disturbance or anxiety  Past Medical History:  Diagnosis Date   Allergic rhinitis 11/25/2016   Anemia    Arthritis    mild, no known falls uses cane   Dementia (HCC)    Dementia of the Alzheimer's type, with late onset, with delirium (HCC) 12/08/2021   Hypertension    Neurofibroma 05/29/2017   Right breast skin lesion- pathology neurofibroma     Past Surgical History:  Procedure Laterality Date   MASS EXCISION Right 06/07/2017   Procedure: EXCISION SKIN LESION OF RIGHT BREAST;  Surgeon: Kallie Manuelita BROCKS, MD;  Location: [facility];  Service: General;  Laterality: Right;    Social History   Socioeconomic History   Marital status: Widowed    Spouse name: Not on file   Number of children: 0   Years of education: Not on file   Highest education level: Not on file  Occupational History   Not on file  Tobacco Use   Smoking status: Never   Smokeless tobacco: Never  Vaping Use   Vaping status: Never Used  Substance and Sexual Activity   Alcohol use: No   Drug use: No   Sexual activity: Not Currently  Other Topics  Concern   Not on file  Social History Narrative   Not on file   Social Drivers of Health   Financial Resource Strain: Low Risk  (03/31/2018)   Overall Financial Resource Strain (CARDIA)    Difficulty of Paying Living Expenses: Not hard at all  Food Insecurity: No Food Insecurity (03/31/2018)   Hunger Vital Sign    Worried About Running Out of Food in the Last Year: Never true    Ran Out of Food in the Last Year: Never true  Transportation Needs: No Transportation Needs (03/31/2018)   PRAPARE - Administrator, Civil Service (Medical): No    Lack of Transportation (Non-Medical): No  Physical Activity: Inactive (03/31/2018)   Exercise Vital Sign    Days of Exercise per Week: 0 days    Minutes of Exercise per Session: 0 min  Stress: No Stress Concern Present (03/31/2018)   [employer] of Occupational Health - Occupational Stress Questionnaire    Feeling of Stress : Not at all  Social Connections: Somewhat Isolated (03/31/2018)   Social Connection and Isolation Panel    Frequency of Communication with Friends and Family: More than three times a week    Frequency of Social Gatherings with Friends and Family: Once a week    Attends Religious Services: 1 to 4 times per year    Active Member of [employer] or Organizations:  No    Attends Banker Meetings: Never    Marital Status: Widowed  Intimate Partner Violence: Not At Risk (03/31/2018)   Humiliation, Afraid, Rape, and Kick questionnaire    Fear of Current or Ex-Partner: No    Emotionally Abused: No    Physically Abused: No    Sexually Abused: No   Family History  Problem Relation Age of Onset   Diabetes Sister    Hypertension Sister       VITAL SIGNS BP (!) 147/62   Pulse (!) 58   Ht 4' 11 (1.499 m)   Wt 153 lb (69.4 kg)   SpO2 97%   BMI 30.90 kg/m   Outpatient Encounter Medications as of 03/19/2024  Medication Sig   acetaminophen  (TYLENOL ) 325 MG tablet Take 650 mg by mouth every 6 (six) hours  as needed.   albuterol  (PROVENTIL ) (2.5 MG/3ML) 0.083% nebulizer solution Take 2.5 mg by nebulization every 4 (four) hours as needed for wheezing or shortness of breath.   NON FORMULARY Diet: Dysphagia 1 (puree) with thin liquids   polyethylene glycol (MIRALAX  / GLYCOLAX ) 17 g packet Take 17 g by mouth daily.   polyvinyl alcohol (LIQUIFILM TEARS) 1.4 % ophthalmic solution Place 1 drop into both eyes in the morning and at bedtime. Wait at least 5 minutes between multiple drops in same eye.   No facility-administered encounter medications on file as of 03/19/2024.     SIGNIFICANT DIAGNOSTIC EXAMS  LABS REVIEWED:     01-25-23: albumin 3.0    04-18-23: wbc 5.0; hgb 12.6; hct 39.4 mcv 96.6 plt 153; glucose 87; bun 18; creat 0.91; k+ 3.7; na++ 139; ca 8.4 gfr 55; protein 5.5 albumin 2.9 08-16-23: glucose 116; bun 19; creat 1.04 ;k+ 36; na++ 139; ca 9.0; gfr 47   10-17-23: wbc 5.1; hgb 12.4; hct 38.9; mcv 96.0 plt 160; glucose 86; bun 19; creat 0.89; k+ 3.7; na++ 140 ; ca 8.6; gfr 57; protein 5.9; albumin 3.1; vitamin B 12: 220  NO NEW LABS.   Review of Systems  Reason unable to perform ROS: dementia.    Physical Exam Constitutional:      General: She is not in acute distress.    Appearance: She is well-developed. She is not diaphoretic.  Neck:     Thyroid : No thyromegaly.  Cardiovascular:     Rate and Rhythm: Normal rate and regular rhythm.     Heart sounds: Normal heart sounds.  Pulmonary:     Effort: Pulmonary effort is normal. No respiratory distress.     Breath sounds: Normal breath sounds.  Abdominal:     General: Bowel sounds are normal. There is no distension.     Palpations: Abdomen is soft.     Tenderness: There is no abdominal tenderness.  Musculoskeletal:     Cervical back: Neck supple.     Right lower leg: No edema.     Left lower leg: No edema.  Lymphadenopathy:     Cervical: No cervical adenopathy.  Skin:    General: Skin is warm and dry.  Neurological:     Mental  Status: She is alert. Mental status is at baseline.  Psychiatric:        Mood and Affect: Mood normal.       ASSESSMENT/ PLAN:  TODAY  Aortic atherosclerosis Chronic respiratory failure with hypoxia Severe late onset alzheimer's disease without behavioral disturbance, no psychotic disturbance, mood disturbance or anxiety  Will continue current medications Will continue current plan of  care Will continue to monitor her status.   Time spent with patient: 40 minutes: medications; plan of care; dietary.    Barnie Seip NP [facility]   call [phone]

## 2024-04-01 ENCOUNTER — Non-Acute Institutional Stay (SKILLED_NURSING_FACILITY): Payer: Self-pay | Admitting: Adult Health

## 2024-04-01 ENCOUNTER — Encounter: Payer: Self-pay | Admitting: Adult Health

## 2024-04-01 DIAGNOSIS — G301 Alzheimer's disease with late onset: Secondary | ICD-10-CM

## 2024-04-01 DIAGNOSIS — I10 Essential (primary) hypertension: Secondary | ICD-10-CM | POA: Diagnosis not present

## 2024-04-01 DIAGNOSIS — E43 Unspecified severe protein-calorie malnutrition: Secondary | ICD-10-CM | POA: Diagnosis not present

## 2024-04-01 DIAGNOSIS — F02C0 Dementia in other diseases classified elsewhere, severe, without behavioral disturbance, psychotic disturbance, mood disturbance, and anxiety: Secondary | ICD-10-CM | POA: Diagnosis not present

## 2024-04-01 NOTE — Progress Notes (Signed)
 Location:  [facility] Room Number: 149 D Place of Service:  SNF (31)   CODE STATUS: DNR  No Known Allergies  Chief Complaint  Patient presents with   Medical Management of Chronic Issues            Severe late onset alzheimer's disease without behavioral disturbance, psychotic disturbance, mood disturbance or anxiety:    Essential hypertension:    Severe protein calorie malnutrition:    HPI:  She is a 89 y.o. long term resident of this facility being seen for the management of her chronic illnesses:  Severe late onset alzheimer's disease without behavioral disturbance, psychotic disturbance, mood disturbance or anxiety:    Essential hypertension:    Severe protein calorie malnutrition. She is followed by hospice care. There are no reports of uncontrolled pain. No reports of anxiety.    Past Medical History:  Diagnosis Date   Allergic rhinitis 11/25/2016   Anemia    Arthritis    mild, no known falls uses cane   Dementia (HCC)    Dementia of the Alzheimer's type, with late onset, with delirium (HCC) 12/08/2021   Hypertension    Neurofibroma 05/29/2017   Right breast skin lesion- pathology neurofibroma     Past Surgical History:  Procedure Laterality Date   MASS EXCISION Right 06/07/2017   Procedure: EXCISION SKIN LESION OF RIGHT BREAST;  Surgeon: Kallie Manuelita BROCKS, MD;  Location: [facility];  Service: General;  Laterality: Right;    Social History   Socioeconomic History   Marital status: Widowed    Spouse name: Not on file   Number of children: 0   Years of education: Not on file   Highest education level: Not on file  Occupational History   Not on file  Tobacco Use   Smoking status: Never   Smokeless tobacco: Never  Vaping Use   Vaping status: Never Used  Substance and Sexual Activity   Alcohol use: No   Drug use: No   Sexual activity: Not Currently  Other Topics Concern   Not on file  Social History Narrative   Not on file    Social Drivers of Health   Financial Resource Strain: Low Risk  (03/31/2018)   Overall Financial Resource Strain (CARDIA)    Difficulty of Paying Living Expenses: Not hard at all  Food Insecurity: No Food Insecurity (03/31/2018)   Hunger Vital Sign    Worried About Running Out of Food in the Last Year: Never true    Ran Out of Food in the Last Year: Never true  Transportation Needs: No Transportation Needs (03/31/2018)   PRAPARE - Administrator, Civil Service (Medical): No    Lack of Transportation (Non-Medical): No  Physical Activity: Inactive (03/31/2018)   Exercise Vital Sign    Days of Exercise per Week: 0 days    Minutes of Exercise per Session: 0 min  Stress: No Stress Concern Present (03/31/2018)   [employer] of Occupational Health - Occupational Stress Questionnaire    Feeling of Stress : Not at all  Social Connections: Somewhat Isolated (03/31/2018)   Social Connection and Isolation Panel    Frequency of Communication with Friends and Family: More than three times a week    Frequency of Social Gatherings with Friends and Family: Once a week    Attends Religious Services: 1 to 4 times per year    Active Member of [employer] or Organizations: No    Attends Banker Meetings:  Never    Marital Status: Widowed  Intimate Partner Violence: Not At Risk (03/31/2018)   Humiliation, Afraid, Rape, and Kick questionnaire    Fear of Current or Ex-Partner: No    Emotionally Abused: No    Physically Abused: No    Sexually Abused: No   Family History  Problem Relation Age of Onset   Diabetes Sister    Hypertension Sister       VITAL SIGNS BP 125/64   Pulse 81   Temp (!) 97.4 F (36.3 C)   Resp 20   Ht 4' 11 (1.499 m)   Wt 145 lb 12.8 oz (66.1 kg)   SpO2 96%   BMI 29.45 kg/m   Outpatient Encounter Medications as of 04/01/2024  Medication Sig   acetaminophen  (TYLENOL ) 325 MG tablet Take 650 mg by mouth every 6 (six) hours as needed.   albuterol   (PROVENTIL ) (2.5 MG/3ML) 0.083% nebulizer solution Take 2.5 mg by nebulization every 4 (four) hours as needed for wheezing or shortness of breath.   NON FORMULARY Diet: Dysphagia 1 (puree) with thin liquids   polyethylene glycol (MIRALAX  / GLYCOLAX ) 17 g packet Take 17 g by mouth daily.   polyvinyl alcohol (LIQUIFILM TEARS) 1.4 % ophthalmic solution Place 1 drop into both eyes in the morning and at bedtime. Wait at least 5 minutes between multiple drops in same eye.   No facility-administered encounter medications on file as of 04/01/2024.     SIGNIFICANT DIAGNOSTIC EXAMS  LABS REVIEWED:        04-18-23: wbc 5.0; hgb 12.6; hct 39.4 mcv 96.6 plt 153; glucose 87; bun 18; creat 0.91; k+ 3.7; na++ 139; ca 8.4 gfr 55; protein 5.5 albumin 2.9 08-16-23: glucose 116; bun 19; creat 1.04 ;k+ 36; na++ 139; ca 9.0; gfr 47   10-17-23: wbc 5.1; hgb 12.4; hct 38.9; mcv 96.0 plt 160; glucose 86; bun 19; creat 0.89; k+ 3.7; na++ 140 ; ca 8.6; gfr 57; protein 5.9; albumin 3.1; vitamin B 12: 220  NO NEW LABS.   Review of Systems  Constitutional:  Negative for malaise/fatigue.  Respiratory:  Negative for cough and shortness of breath.   Cardiovascular:  Negative for chest pain, palpitations and leg swelling.  Gastrointestinal:  Negative for abdominal pain, constipation and heartburn.  Musculoskeletal:  Negative for back pain, joint pain and myalgias.  Skin: Negative.   Neurological:  Negative for dizziness.  Psychiatric/Behavioral:  The patient is not nervous/anxious.    Physical Exam Constitutional:      General: She is not in acute distress.    Appearance: She is well-developed. She is not diaphoretic.  Neck:     Thyroid : No thyromegaly.  Cardiovascular:     Rate and Rhythm: Normal rate and regular rhythm.     Heart sounds: Normal heart sounds.  Pulmonary:     Effort: Pulmonary effort is normal. No respiratory distress.     Breath sounds: Normal breath sounds.  Abdominal:     General: Bowel  sounds are normal. There is no distension.     Palpations: Abdomen is soft.     Tenderness: There is no abdominal tenderness.  Musculoskeletal:     Cervical back: Neck supple.     Right lower leg: No edema.     Left lower leg: No edema.  Lymphadenopathy:     Cervical: No cervical adenopathy.  Skin:    General: Skin is warm and dry.  Neurological:     Mental Status: She is alert. Mental status  is at baseline.  Psychiatric:        Mood and Affect: Mood normal.        ASSESSMENT/ PLAN:  TODAY   Severe late onset alzheimer's disease without behavioral disturbance, psychotic disturbance, mood disturbance or anxiety: weight is 145 pounds; is followed by hospice care.   2. Essential hypertension: b/p 125/64  3. Severe protein calorie malnutrition: protein 5.9 albumin 3.1   PREVIOUS   4. Anemia due to ckd stage 3 hgb 12.4; is off iron   5. Aortic atherosclerosis (ct 11-16-17); is not on statin due to advanced age  64. Thrombocytopenia: plt 153  7. Chronic constipation: will continue miralax  daily   8. Hyperlipidemia LDL goal <130: not on statin due to advanced age  21. Chronic kidney disease stage 3a: bun 19; creat 0.89; gfr 57  10. Chronic respiratory failure with hypoxia: is on room air will monitor      Barnie Seip NP [facility]  call [phone]

## 2024-05-13 ENCOUNTER — Non-Acute Institutional Stay (SKILLED_NURSING_FACILITY): Payer: Self-pay | Admitting: Adult Health

## 2024-05-13 DIAGNOSIS — D649 Anemia, unspecified: Secondary | ICD-10-CM | POA: Diagnosis not present

## 2024-05-13 DIAGNOSIS — D696 Thrombocytopenia, unspecified: Secondary | ICD-10-CM

## 2024-05-13 DIAGNOSIS — I700 Atherosclerosis of aorta: Secondary | ICD-10-CM

## 2024-05-14 ENCOUNTER — Encounter: Payer: Self-pay | Admitting: Adult Health

## 2024-05-14 NOTE — Progress Notes (Signed)
 Location:  [facility] Room Number: [id number] Place of Service:  SNF (31)   CODE STATUS: dnr   No Known Allergies  Chief Complaint  Patient presents with   Medical Management of Chronic Issues          Anemia due to ckd stage 3:  Aortic atherosclerosis  Thrombocytopenia:    HPI:  She is a 89 y.o. long term resident of this facility being seen for the management of her chronic illnesses: Anemia due to ckd stage 3:  Aortic atherosclerosis  Thrombocytopenia:. There are no reports of uncontrolled pain. She continues to be followed by hospice care.    Past Medical History:  Diagnosis Date   Allergic rhinitis 11/25/2016   Anemia    Arthritis    mild, no known falls uses cane   Dementia (HCC)    Dementia of the Alzheimer's type, with late onset, with delirium (HCC) 12/08/2021   Hypertension    Neurofibroma 05/29/2017   Right breast skin lesion- pathology neurofibroma     Past Surgical History:  Procedure Laterality Date   MASS EXCISION Right 06/07/2017   Procedure: EXCISION SKIN LESION OF RIGHT BREAST;  Surgeon: Kallie Manuelita BROCKS, MD;  Location: [facility];  Service: General;  Laterality: Right;    Social History   Socioeconomic History   Marital status: Widowed    Spouse name: Not on file   Number of children: 0   Years of education: Not on file   Highest education level: Not on file  Occupational History   Not on file  Tobacco Use   Smoking status: Never   Smokeless tobacco: Never  Vaping Use   Vaping status: Never Used  Substance and Sexual Activity   Alcohol use: No   Drug use: No   Sexual activity: Not Currently  Other Topics Concern   Not on file  Social History Narrative   Not on file   Social Drivers of Health   Financial Resource Strain: Low Risk  (03/31/2018)   Overall Financial Resource Strain (CARDIA)    Difficulty of Paying Living Expenses: Not hard at all  Food Insecurity: No Food Insecurity (03/31/2018)   Hunger Vital Sign     Worried About Running Out of Food in the Last Year: Never true    Ran Out of Food in the Last Year: Never true  Transportation Needs: No Transportation Needs (03/31/2018)   PRAPARE - Administrator, Civil Service (Medical): No    Lack of Transportation (Non-Medical): No  Physical Activity: Inactive (03/31/2018)   Exercise Vital Sign    Days of Exercise per Week: 0 days    Minutes of Exercise per Session: 0 min  Stress: No Stress Concern Present (03/31/2018)   [employer] of Occupational Health - Occupational Stress Questionnaire    Feeling of Stress : Not at all  Social Connections: Somewhat Isolated (03/31/2018)   Social Connection and Isolation Panel    Frequency of Communication with Friends and Family: More than three times a week    Frequency of Social Gatherings with Friends and Family: Once a week    Attends Religious Services: 1 to 4 times per year    Active Member of [employer] or Organizations: No    Attends Banker Meetings: Never    Marital Status: Widowed  Intimate Partner Violence: Not At Risk (03/31/2018)   Humiliation, Afraid, Rape, and Kick questionnaire    Fear of Current or Ex-Partner: No  Emotionally Abused: No    Physically Abused: No    Sexually Abused: No   Family History  Problem Relation Age of Onset   Diabetes Sister    Hypertension Sister       VITAL SIGNS BP 135/71   Pulse 64   Temp (!) 97.1 F (36.2 C)   Resp 20   Ht 4' 11 (1.499 m)   Wt 138 lb 9.6 oz (62.9 kg)   SpO2 94%   BMI 27.99 kg/m   Outpatient Encounter Medications as of 05/13/2024  Medication Sig   acetaminophen  (TYLENOL ) 325 MG tablet Take 650 mg by mouth every 6 (six) hours as needed.   albuterol  (PROVENTIL ) (2.5 MG/3ML) 0.083% nebulizer solution Take 2.5 mg by nebulization every 4 (four) hours as needed for wheezing or shortness of breath.   NON FORMULARY Diet: Dysphagia 1 (puree) with thin liquids   polyethylene glycol (MIRALAX  / GLYCOLAX ) 17 g  packet Take 17 g by mouth daily.   polyvinyl alcohol (LIQUIFILM TEARS) 1.4 % ophthalmic solution Place 1 drop into both eyes in the morning and at bedtime. Wait at least 5 minutes between multiple drops in same eye.   No facility-administered encounter medications on file as of 05/13/2024.     SIGNIFICANT DIAGNOSTIC EXAMS  LABS REVIEWED:        04-18-23: wbc 5.0; hgb 12.6; hct 39.4 mcv 96.6 plt 153; glucose 87; bun 18; creat 0.91; k+ 3.7; na++ 139; ca 8.4 gfr 55; protein 5.5 albumin 2.9 08-16-23: glucose 116; bun 19; creat 1.04 ;k+ 36; na++ 139; ca 9.0; gfr 47   10-17-23: wbc 5.1; hgb 12.4; hct 38.9; mcv 96.0 plt 160; glucose 86; bun 19; creat 0.89; k+ 3.7; na++ 140 ; ca 8.6; gfr 57; protein 5.9; albumin 3.1; vitamin B 12: 220  NO NEW LABS.   Review of Systems  Constitutional:  Negative for malaise/fatigue.  Respiratory:  Negative for cough and shortness of breath.   Cardiovascular:  Negative for chest pain, palpitations and leg swelling.  Gastrointestinal:  Negative for abdominal pain, constipation and heartburn.  Musculoskeletal:  Negative for back pain, joint pain and myalgias.  Skin: Negative.   Neurological:  Negative for dizziness.  Psychiatric/Behavioral:  The patient is not nervous/anxious.     Physical Exam Constitutional:      General: She is not in acute distress.    Appearance: She is well-developed. She is not diaphoretic.  Neck:     Thyroid : No thyromegaly.  Cardiovascular:     Rate and Rhythm: Normal rate and regular rhythm.     Heart sounds: Normal heart sounds.  Pulmonary:     Effort: Pulmonary effort is normal. No respiratory distress.     Breath sounds: Normal breath sounds.  Abdominal:     General: Bowel sounds are normal. There is no distension.     Palpations: Abdomen is soft.     Tenderness: There is no abdominal tenderness.  Musculoskeletal:     Cervical back: Neck supple.     Right lower leg: No edema.     Left lower leg: No edema.   Lymphadenopathy:     Cervical: No cervical adenopathy.  Skin:    General: Skin is warm and dry.  Neurological:     Mental Status: She is alert. Mental status is at baseline.  Psychiatric:        Mood and Affect: Mood normal.          ASSESSMENT/ PLAN:  TODAY   Anemia due  to ckd stage 3: hgb 12.4 is off iron  2. Aortic atherosclerosis (ct 11-16-17) not on statin due to advanced age.   3. Thrombocytopenia: plt 153  PREVIOUS   4. Chronic constipation: will continue miralax  daily   5. Hyperlipidemia LDL goal <130: not on statin due to advanced age  51. Chronic kidney disease stage 3a: bun 19; creat 0.89; gfr 57  7. Chronic respiratory failure with hypoxia: is on room air will monitor   8. Severe late onset alzheimer's disease without behavioral disturbance, psychotic disturbance, mood disturbance or anxiety: weight is 138 pounds; is followed by hospice care.   9. Essential hypertension: b/p 135/71  10. Severe protein calorie malnutrition: protein 5.9 albumin 3.1      Barnie Seip NP [facility]   call [phone]

## 2024-06-15 ENCOUNTER — Non-Acute Institutional Stay (SKILLED_NURSING_FACILITY): Payer: Self-pay | Admitting: Adult Health

## 2024-06-15 ENCOUNTER — Encounter: Payer: Self-pay | Admitting: Adult Health

## 2024-06-15 DIAGNOSIS — K5909 Other constipation: Secondary | ICD-10-CM | POA: Diagnosis not present

## 2024-06-15 DIAGNOSIS — N1832 Chronic kidney disease, stage 3b: Secondary | ICD-10-CM

## 2024-06-15 DIAGNOSIS — E785 Hyperlipidemia, unspecified: Secondary | ICD-10-CM

## 2024-06-15 NOTE — Progress Notes (Signed)
 Location:  [facility] Room Number: [id number] Place of Service:  SNF (31)   CODE STATUS: dnr   No Known Allergies  Chief Complaint  Patient presents with   Medical Management of Chronic Issues           Chronic constipation:  Hyperlipidemia ldl goal <130:  Chronic kidney disease stage 3a    HPI:  She is a 89 y.o. long term resident of this facility being seen for the management of her chronic illnesses:Chronic constipation:  Hyperlipidemia ldl goal <130:  Chronic kidney disease stage 3a. There are no reports of uncontrolled pain. She will need labs to look at her renal function and anemia.    Past Medical History:  Diagnosis Date   Allergic rhinitis 11/25/2016   Anemia    Arthritis    mild, no known falls uses cane   Dementia (HCC)    Dementia of the Alzheimer's type, with late onset, with delirium (HCC) 12/08/2021   Hypertension    Neurofibroma 05/29/2017   Right breast skin lesion- pathology neurofibroma     Past Surgical History:  Procedure Laterality Date   MASS EXCISION Right 06/07/2017   Procedure: EXCISION SKIN LESION OF RIGHT BREAST;  Surgeon: Kallie Manuelita BROCKS, MD;  Location: [facility];  Service: General;  Laterality: Right;    Social History   Socioeconomic History   Marital status: Widowed    Spouse name: Not on file   Number of children: 0   Years of education: Not on file   Highest education level: Not on file  Occupational History   Not on file  Tobacco Use   Smoking status: Never   Smokeless tobacco: Never  Vaping Use   Vaping status: Never Used  Substance and Sexual Activity   Alcohol use: No   Drug use: No   Sexual activity: Not Currently  Other Topics Concern   Not on file  Social History Narrative   Not on file   Social Drivers of Health   Financial Resource Strain: Low Risk  (03/31/2018)   Overall Financial Resource Strain (CARDIA)    Difficulty of Paying Living Expenses: Not hard at all  Food Insecurity: No Food  Insecurity (03/31/2018)   Hunger Vital Sign    Worried About Running Out of Food in the Last Year: Never true    Ran Out of Food in the Last Year: Never true  Transportation Needs: No Transportation Needs (03/31/2018)   PRAPARE - Administrator, Civil Service (Medical): No    Lack of Transportation (Non-Medical): No  Physical Activity: Inactive (03/31/2018)   Exercise Vital Sign    Days of Exercise per Week: 0 days    Minutes of Exercise per Session: 0 min  Stress: No Stress Concern Present (03/31/2018)   [employer] of Occupational Health - Occupational Stress Questionnaire    Feeling of Stress : Not at all  Social Connections: Somewhat Isolated (03/31/2018)   Social Connection and Isolation Panel    Frequency of Communication with Friends and Family: More than three times a week    Frequency of Social Gatherings with Friends and Family: Once a week    Attends Religious Services: 1 to 4 times per year    Active Member of [employer] or Organizations: No    Attends Banker Meetings: Never    Marital Status: Widowed  Intimate Partner Violence: Not At Risk (03/31/2018)   Humiliation, Afraid, Rape, and Kick questionnaire    Fear  of Current or Ex-Partner: No    Emotionally Abused: No    Physically Abused: No    Sexually Abused: No   Family History  Problem Relation Age of Onset   Diabetes Sister    Hypertension Sister       VITAL SIGNS BP 121/76   Pulse 67   Temp 98 F (36.7 C)   Resp 18   Ht 4' 11 (1.499 m)   Wt 135 lb (61.2 kg)   SpO2 96%   BMI 27.27 kg/m   Outpatient Encounter Medications as of 06/15/2024  Medication Sig   Nutritional Supplements (,FEEDING SUPPLEMENT, PROSOURCE PLUS) liquid Take 30 mLs by mouth 2 (two) times daily between meals.   acetaminophen  (TYLENOL ) 325 MG tablet Take 650 mg by mouth every 6 (six) hours as needed.   albuterol  (PROVENTIL ) (2.5 MG/3ML) 0.083% nebulizer solution Take 2.5 mg by nebulization every 4 (four) hours  as needed for wheezing or shortness of breath.   NON FORMULARY Diet: Dysphagia 1 (puree) with thin liquids   polyethylene glycol (MIRALAX  / GLYCOLAX ) 17 g packet Take 17 g by mouth daily.   polyvinyl alcohol (LIQUIFILM TEARS) 1.4 % ophthalmic solution Place 1 drop into both eyes in the morning and at bedtime. Wait at least 5 minutes between multiple drops in same eye.   No facility-administered encounter medications on file as of 06/15/2024.     SIGNIFICANT DIAGNOSTIC EXAMS  LABS REVIEWED:        04-18-23: wbc 5.0; hgb 12.6; hct 39.4 mcv 96.6 plt 153; glucose 87; bun 18; creat 0.91; k+ 3.7; na++ 139; ca 8.4 gfr 55; protein 5.5 albumin 2.9 08-16-23: glucose 116; bun 19; creat 1.04 ;k+ 36; na++ 139; ca 9.0; gfr 47   10-17-23: wbc 5.1; hgb 12.4; hct 38.9; mcv 96.0 plt 160; glucose 86; bun 19; creat 0.89; k+ 3.7; na++ 140 ; ca 8.6; gfr 57; protein 5.9; albumin 3.1; vitamin B 12: 220  NO NEW LABS.   Review of Systems  Constitutional:  Negative for malaise/fatigue.  Respiratory:  Negative for cough and shortness of breath.   Cardiovascular:  Negative for chest pain, palpitations and leg swelling.  Gastrointestinal:  Negative for abdominal pain, constipation and heartburn.  Musculoskeletal:  Negative for back pain, joint pain and myalgias.  Skin: Negative.   Neurological:  Negative for dizziness.  Psychiatric/Behavioral:  The patient is not nervous/anxious.     Physical Exam Constitutional:      General: She is not in acute distress.    Appearance: She is well-developed. She is not diaphoretic.  Neck:     Thyroid : No thyromegaly.  Cardiovascular:     Rate and Rhythm: Normal rate and regular rhythm.     Heart sounds: Normal heart sounds.  Pulmonary:     Effort: Pulmonary effort is normal. No respiratory distress.     Breath sounds: Normal breath sounds.  Abdominal:     General: Bowel sounds are normal. There is no distension.     Palpations: Abdomen is soft.     Tenderness: There  is no abdominal tenderness.  Musculoskeletal:     Cervical back: Neck supple.     Right lower leg: No edema.     Left lower leg: No edema.  Lymphadenopathy:     Cervical: No cervical adenopathy.  Skin:    General: Skin is warm and dry.  Neurological:     Mental Status: She is alert. Mental status is at baseline.  Psychiatric:  Mood and Affect: Mood normal.         ASSESSMENT/ PLAN:  TODAY   Chronic constipation: is on miralax  daily   2. Hyperlipidemia ldl goal <130: is not on statin due to advanced age  70. Chronic kidney disease stage 3a: bun 19; creat 0.89; gfr 57   PREVIOUS   4. Chronic respiratory failure with hypoxia: is on room air will monitor   5. Severe late onset alzheimer's disease without behavioral disturbance, psychotic disturbance, mood disturbance or anxiety: weight is 135 pounds; is followed by hospice care.   6. Essential hypertension: b/p 121/76  7. Severe protein calorie malnutrition: protein 5.9 albumin 3.1   8. Anemia due to ckd stage 3: hgb 12.4 is off iron  9. Aortic atherosclerosis (ct 11-16-17) not on statin due to advanced age.   10. Thrombocytopenia: plt 153      Barnie Seip NP [facility]   call [phone]

## 2024-06-18 ENCOUNTER — Other Ambulatory Visit (HOSPITAL_COMMUNITY)
Admission: RE | Admit: 2024-06-18 | Discharge: 2024-06-18 | Disposition: A | Discharge status: Home / Self Care | Source: Skilled Nursing Facility | Attending: Adult Health | Admitting: Adult Health

## 2024-06-18 DIAGNOSIS — I10 Essential (primary) hypertension: Secondary | ICD-10-CM | POA: Diagnosis present

## 2024-06-18 LAB — COMPREHENSIVE METABOLIC PANEL WITH GFR
ALT: <5 U/L (ref 0–44)
AST: 15 U/L (ref 15–41)
Albumin: 3.3 g/dL — ABNORMAL LOW (ref 3.5–5.0)
Alkaline Phosphatase: 62 U/L (ref 38–126)
Anion gap: 7 (ref 5–15)
BUN: 16 mg/dL (ref 8–23)
CO2: 28 mmol/L (ref 22–32)
Calcium: 8.8 mg/dL — ABNORMAL LOW (ref 8.9–10.3)
Chloride: 108 mmol/L (ref 98–111)
Creatinine, Ser: 0.69 mg/dL (ref 0.44–1.00)
GFR, Estimated: >60 mL/min (ref >60)
Glucose, Bld: 90 mg/dL (ref 70–99)
Potassium: 3.8 mmol/L (ref 3.5–5.1)
Sodium: 143 mmol/L (ref 135–145)
Total Bilirubin: 0.3 mg/dL (ref 0.0–1.2)
Total Protein: 6 g/dL — ABNORMAL LOW (ref 6.5–8.1)

## 2024-06-18 LAB — CBC
HCT: 36.6 % (ref 36.0–46.0)
Hemoglobin: 11.8 g/dL — ABNORMAL LOW (ref 12.0–15.0)
MCH: 30.6 pg (ref 26.0–34.0)
MCHC: 32.2 g/dL (ref 30.0–36.0)
MCV: 94.8 fL (ref 80.0–100.0)
Platelets: 177 K/uL (ref 150–400)
RBC: 3.86 MIL/uL — ABNORMAL LOW (ref 3.87–5.11)
RDW: 15.8 % — ABNORMAL HIGH (ref 11.5–15.5)
WBC: 4.9 K/uL (ref 4.0–10.5)
nRBC: 0 % (ref 0.0–0.2)

## 2024-07-14 ENCOUNTER — Non-Acute Institutional Stay (SKILLED_NURSING_FACILITY): Payer: Self-pay | Admitting: Adult Health

## 2024-07-14 DIAGNOSIS — G301 Alzheimer's disease with late onset: Secondary | ICD-10-CM | POA: Diagnosis not present

## 2024-07-14 DIAGNOSIS — J9611 Chronic respiratory failure with hypoxia: Secondary | ICD-10-CM

## 2024-07-14 DIAGNOSIS — F02C0 Dementia in other diseases classified elsewhere, severe, without behavioral disturbance, psychotic disturbance, mood disturbance, and anxiety: Secondary | ICD-10-CM | POA: Diagnosis not present

## 2024-07-14 DIAGNOSIS — I10 Essential (primary) hypertension: Secondary | ICD-10-CM

## 2024-07-21 ENCOUNTER — Encounter: Payer: Self-pay | Admitting: Adult Health

## 2024-07-21 NOTE — Progress Notes (Signed)
 Location:  [facility] Room Number: [id number] Place of Service:  SNF (31)   CODE STATUS: dnr   No Known Allergies  Chief Complaint  Patient presents with   Medical Management of Chronic Issues         Chronic respiratory failure with hypoxia:  Severe late onset alzheimer's disease without behavioral disturbance, psychotic disturbance, mood disturbance or anxiety: Essential hypertension    HPI:  She is a 89 y.o. long term resident of this facility being seen for the management of her chronic illnesses: Chronic respiratory failure with hypoxia:  Severe late onset alzheimer's disease without behavioral disturbance, psychotic disturbance, mood disturbance or anxiety: Essential hypertension. There are no reports of uncontrolled pain. Her blood pressure readings remain stable; there have been no reports of behavioral issues.    Past Medical History:  Diagnosis Date   Allergic rhinitis 11/25/2016   Anemia    Arthritis    mild, no known falls uses cane   Dementia (HCC)    Dementia of the Alzheimer's type, with late onset, with delirium (HCC) 12/08/2021   Hypertension    Neurofibroma 05/29/2017   Right breast skin lesion- pathology neurofibroma     Past Surgical History:  Procedure Laterality Date   MASS EXCISION Right 06/07/2017   Procedure: EXCISION SKIN LESION OF RIGHT BREAST;  Surgeon: Kallie Manuelita BROCKS, MD;  Location: [facility];  Service: General;  Laterality: Right;    Social History   Socioeconomic History   Marital status: Widowed    Spouse name: Not on file   Number of children: 0   Years of education: Not on file   Highest education level: Not on file  Occupational History   Not on file  Tobacco Use   Smoking status: Never   Smokeless tobacco: Never  Vaping Use   Vaping status: Never Used  Substance and Sexual Activity   Alcohol use: No   Drug use: No   Sexual activity: Not Currently  Other Topics Concern   Not on file  Social History  Narrative   Not on file   Social Drivers of Health   Financial Resource Strain: Low Risk  (03/31/2018)   Overall Financial Resource Strain (CARDIA)    Difficulty of Paying Living Expenses: Not hard at all  Food Insecurity: No Food Insecurity (03/31/2018)   Hunger Vital Sign    Worried About Running Out of Food in the Last Year: Never true    Ran Out of Food in the Last Year: Never true  Transportation Needs: No Transportation Needs (03/31/2018)   PRAPARE - Administrator, Civil Service (Medical): No    Lack of Transportation (Non-Medical): No  Physical Activity: Inactive (03/31/2018)   Exercise Vital Sign    Days of Exercise per Week: 0 days    Minutes of Exercise per Session: 0 min  Stress: No Stress Concern Present (03/31/2018)   [employer] of Occupational Health - Occupational Stress Questionnaire    Feeling of Stress : Not at all  Social Connections: Somewhat Isolated (03/31/2018)   Social Connection and Isolation Panel    Frequency of Communication with Friends and Family: More than three times a week    Frequency of Social Gatherings with Friends and Family: Once a week    Attends Religious Services: 1 to 4 times per year    Active Member of [employer] or Organizations: No    Attends Banker Meetings: Never    Marital Status: Widowed  Intimate Partner Violence: Not At Risk (03/31/2018)   Humiliation, Afraid, Rape, and Kick questionnaire    Fear of Current or Ex-Partner: No    Emotionally Abused: No    Physically Abused: No    Sexually Abused: No   Family History  Problem Relation Age of Onset   Diabetes Sister    Hypertension Sister       VITAL SIGNS BP 131/68   Pulse (!) 51   Temp 97.9 F (36.6 C)   Resp 18   Ht 4' 11 (1.499 m)   Wt 135 lb (61.2 kg)   SpO2 98%   BMI 27.27 kg/m   Outpatient Encounter Medications as of 07/14/2024  Medication Sig   acetaminophen  (TYLENOL ) 325 MG tablet Take 650 mg by mouth every 6 (six) hours as  needed.   albuterol  (PROVENTIL ) (2.5 MG/3ML) 0.083% nebulizer solution Take 2.5 mg by nebulization every 4 (four) hours as needed for wheezing or shortness of breath.   NON FORMULARY Diet: Dysphagia 1 (puree) with thin liquids   Nutritional Supplements (,FEEDING SUPPLEMENT, PROSOURCE PLUS) liquid Take 30 mLs by mouth 2 (two) times daily between meals.   polyethylene glycol (MIRALAX  / GLYCOLAX ) 17 g packet Take 17 g by mouth daily.   polyvinyl alcohol (LIQUIFILM TEARS) 1.4 % ophthalmic solution Place 1 drop into both eyes in the morning and at bedtime. Wait at least 5 minutes between multiple drops in same eye.   No facility-administered encounter medications on file as of 07/14/2024.     SIGNIFICANT DIAGNOSTIC EXAMS  LABS REVIEWED:        04-18-23: wbc 5.0; hgb 12.6; hct 39.4 mcv 96.6 plt 153; glucose 87; bun 18; creat 0.91; k+ 3.7; na++ 139; ca 8.4 gfr 55; protein 5.5 albumin 2.9 08-16-23: glucose 116; bun 19; creat 1.04 ;k+ 36; na++ 139; ca 9.0; gfr 47   10-17-23: wbc 5.1; hgb 12.4; hct 38.9; mcv 96.0 plt 160; glucose 86; bun 19; creat 0.89; k+ 3.7; na++ 140 ; ca 8.6; gfr 57; protein 5.9; albumin 3.1; vitamin B 12: 220  NO NEW LABS.   Review of Systems  Constitutional:  Negative for malaise/fatigue.  Respiratory:  Negative for cough and shortness of breath.   Cardiovascular:  Negative for chest pain, palpitations and leg swelling.  Gastrointestinal:  Negative for abdominal pain, constipation and heartburn.  Musculoskeletal:  Negative for back pain, joint pain and myalgias.  Skin: Negative.   Neurological:  Negative for dizziness.  Psychiatric/Behavioral:  The patient is not nervous/anxious.     Physical Exam Constitutional:      General: She is not in acute distress.    Appearance: She is well-developed. She is not diaphoretic.  Neck:     Thyroid : No thyromegaly.  Cardiovascular:     Rate and Rhythm: Normal rate and regular rhythm.     Heart sounds: Normal heart sounds.   Pulmonary:     Effort: Pulmonary effort is normal. No respiratory distress.     Breath sounds: Normal breath sounds.  Abdominal:     General: Bowel sounds are normal. There is no distension.     Palpations: Abdomen is soft.     Tenderness: There is no abdominal tenderness.  Musculoskeletal:        General: Normal range of motion.     Cervical back: Neck supple.     Right lower leg: No edema.     Left lower leg: No edema.  Lymphadenopathy:     Cervical: No cervical adenopathy.  Skin:    General: Skin is warm and dry.  Neurological:     Mental Status: She is alert. Mental status is at baseline.  Psychiatric:        Mood and Affect: Mood normal.          ASSESSMENT/ PLAN:  TODAY  Chronic respiratory failure with hypoxia: is on room air; will monitor   2. Severe late onset alzheimer's disease without behavioral disturbance, psychotic disturbance, mood disturbance or anxiety: weight is 135; pounds; is followed by hospice care.   3. Essential hypertension: b/p 131/68   PREVIOUS   4. Severe protein calorie malnutrition: protein 5.9 albumin 3.1   5. Anemia due to ckd stage 3: hgb 12.4 is off iron  6. Aortic atherosclerosis (ct 11-16-17) not on statin due to advanced age.   7. Thrombocytopenia: plt 153  8. Chronic constipation: is on miralax  daily   9. Hyperlipidemia ldl goal <130: is not on statin due to advanced age  23. Chronic kidney disease stage 3a: bun 19; creat 0.89; gfr 57     Barnie Seip NP [facility]  call [phone]

## 2024-07-21 NOTE — Progress Notes (Signed)
 [name]

## 2024-08-10 ENCOUNTER — Encounter: Payer: Self-pay | Admitting: Adult Health

## 2024-08-10 ENCOUNTER — Non-Acute Institutional Stay (SKILLED_NURSING_FACILITY): Payer: Self-pay | Admitting: Adult Health

## 2024-08-10 DIAGNOSIS — D649 Anemia, unspecified: Secondary | ICD-10-CM

## 2024-08-10 DIAGNOSIS — E43 Unspecified severe protein-calorie malnutrition: Secondary | ICD-10-CM | POA: Diagnosis not present

## 2024-08-10 DIAGNOSIS — I700 Atherosclerosis of aorta: Secondary | ICD-10-CM

## 2024-08-10 NOTE — Progress Notes (Signed)
 " Location:  [facility] Room Number: 149 Place of Service:  SNF (31)   CODE STATUS: dnr   Allergies[1]  Chief Complaint  Patient presents with   Medical Management of Chronic Issues               Severe protein calorie malnutrition:   Anemia due to ckd stage 3: Aortic atherosclerosis    HPI:  She is a 89 y.o. long term resident of this facility being seen for the management of her chronic illnesses:Severe protein calorie malnutrition:   Anemia due to ckd stage 3: Aortic atherosclerosis. There are no reports of uncontrolled pain. Her mood state remains stable; her weight is stable.    Past Medical History:  Diagnosis Date   Allergic rhinitis 11/25/2016   Anemia    Arthritis    mild, no known falls uses cane   Dementia (HCC)    Dementia of the Alzheimer's type, with late onset, with delirium (HCC) 12/08/2021   Hypertension    Neurofibroma 05/29/2017   Right breast skin lesion- pathology neurofibroma     Past Surgical History:  Procedure Laterality Date   MASS EXCISION Right 06/07/2017   Procedure: EXCISION SKIN LESION OF RIGHT BREAST;  Surgeon: Kallie Manuelita BROCKS, MD;  Location: [facility];  Service: General;  Laterality: Right;    Social History   Socioeconomic History   Marital status: Widowed    Spouse name: Not on file   Number of children: 0   Years of education: Not on file   Highest education level: Not on file  Occupational History   Not on file  Tobacco Use   Smoking status: Never   Smokeless tobacco: Never  Vaping Use   Vaping status: Never Used  Substance and Sexual Activity   Alcohol use: No   Drug use: No   Sexual activity: Not Currently  Other Topics Concern   Not on file  Social History Narrative   Not on file   Social Drivers of Health   Tobacco Use: Low Risk (08/10/2024)   Patient History    Smoking Tobacco Use: Never    Smokeless Tobacco Use: Never    Passive Exposure: Not on file  Financial Resource Strain: Not  on file  Food Insecurity: Not on file  Transportation Needs: Not on file  Physical Activity: Not on file  Stress: Not on file  Social Connections: Not on file  Intimate Partner Violence: Not on file  Depression (PHQ2-9): Low Risk (01/02/2024)   Depression (PHQ2-9)    PHQ-2 Score: 0  Alcohol Screen: Not on file  Housing: Not on file  Utilities: Not on file  Health Literacy: Not on file   Family History  Problem Relation Age of Onset   Diabetes Sister    Hypertension Sister       VITAL SIGNS BP (!) 149/84   Pulse 62   Temp 98.3 F (36.8 C)   Resp 16   Ht 4' 11 (1.499 m)   Wt 138 lb 3.2 oz (62.7 kg)   SpO2 95%   BMI 27.91 kg/m   Outpatient Encounter Medications as of 08/10/2024  Medication Sig   acetaminophen  (TYLENOL ) 325 MG tablet Take 650 mg by mouth every 6 (six) hours as needed.   albuterol  (PROVENTIL ) (2.5 MG/3ML) 0.083% nebulizer solution Take 2.5 mg by nebulization every 4 (four) hours as needed for wheezing or shortness of breath.   NON FORMULARY Diet: Dysphagia 1 (puree) with thin liquids  Nutritional Supplements (,FEEDING SUPPLEMENT, PROSOURCE PLUS) liquid Take 30 mLs by mouth 2 (two) times daily between meals.   polyethylene glycol (MIRALAX  / GLYCOLAX ) 17 g packet Take 17 g by mouth daily.   polyvinyl alcohol (LIQUIFILM TEARS) 1.4 % ophthalmic solution Place 1 drop into both eyes in the morning and at bedtime. Wait at least 5 minutes between multiple drops in same eye.   No facility-administered encounter medications on file as of 08/10/2024.     SIGNIFICANT DIAGNOSTIC EXAMS  LABS REVIEWED:        04-18-23: wbc 5.0; hgb 12.6; hct 39.4 mcv 96.6 plt 153; glucose 87; bun 18; creat 0.91; k+ 3.7; na++ 139; ca 8.4 gfr 55; protein 5.5 albumin 2.9 08-16-23: glucose 116; bun 19; creat 1.04 ;k+ 36; na++ 139; ca 9.0; gfr 47   10-17-23: wbc 5.1; hgb 12.4; hct 38.9; mcv 96.0 plt 160; glucose 86; bun 19; creat 0.89; k+ 3.7; na++ 140 ; ca 8.6; gfr 57; protein 5.9;  albumin 3.1; vitamin B 12: 220  NO NEW LABS.   Review of Systems  Constitutional:  Negative for malaise/fatigue.  Respiratory:  Negative for cough and shortness of breath.   Cardiovascular:  Negative for chest pain, palpitations and leg swelling.  Gastrointestinal:  Negative for abdominal pain, constipation and heartburn.  Musculoskeletal:  Negative for back pain, joint pain and myalgias.  Skin: Negative.   Neurological:  Negative for dizziness.  Psychiatric/Behavioral:  The patient is not nervous/anxious.      Physical Exam Constitutional:      General: She is not in acute distress.    Appearance: She is well-developed. She is not diaphoretic.  Neck:     Thyroid : No thyromegaly.  Cardiovascular:     Rate and Rhythm: Normal rate and regular rhythm.     Pulses: Normal pulses.     Heart sounds: Normal heart sounds.  Pulmonary:     Effort: Pulmonary effort is normal. No respiratory distress.     Breath sounds: Normal breath sounds.  Abdominal:     General: Bowel sounds are normal. There is no distension.     Palpations: Abdomen is soft.     Tenderness: There is no abdominal tenderness.  Musculoskeletal:        General: Normal range of motion.     Cervical back: Neck supple.     Right lower leg: No edema.     Left lower leg: No edema.  Lymphadenopathy:     Cervical: No cervical adenopathy.  Skin:    General: Skin is warm and dry.  Neurological:     Mental Status: She is alert. Mental status is at baseline.  Psychiatric:        Mood and Affect: Mood normal.           ASSESSMENT/ PLAN:  TODAY  Severe protein calorie malnutrition: protein 5.9 albumin 3.1; weight is stable  2. Anemia due to ckd stage 3: hgb 12.4 is off iron  3. Aortic atherosclerosis: (ct 11-16-17): not on statin due to advanced age.    PREVIOUS   4. Thrombocytopenia: plt 153  5. Chronic constipation: is on miralax  daily   6. Hyperlipidemia ldl goal <130: is not on statin due to advanced  age  38. Chronic kidney disease stage 3a: bun 19; creat 0.89; gfr 57   8. Chronic respiratory failure with hypoxia: is on room air; will monitor   9. Severe late onset alzheimer's disease without behavioral disturbance, psychotic disturbance, mood disturbance or anxiety: weight  is 138; pounds; is followed by hospice care.   10. Essential hypertension: b/p 149/84  11. Dysphagia: is on puree diet without signs of aspiration present.      Barnie Seip NP [facility]   call [phone]     [1] No Known Allergies  "

## 2024-08-19 ENCOUNTER — Other Ambulatory Visit (HOSPITAL_COMMUNITY)
Admission: RE | Admit: 2024-08-19 | Discharge: 2024-08-19 | Disposition: A | Discharge status: Home / Self Care | Source: Skilled Nursing Facility | Attending: Adult Health | Admitting: Adult Health

## 2024-08-19 DIAGNOSIS — I10 Essential (primary) hypertension: Secondary | ICD-10-CM | POA: Insufficient documentation

## 2024-08-19 LAB — BASIC METABOLIC PANEL WITH GFR
Anion gap: 11 (ref 5–15)
BUN: 23 mg/dL (ref 8–23)
CO2: 24 mmol/L (ref 22–32)
Calcium: 9 mg/dL (ref 8.9–10.3)
Chloride: 110 mmol/L (ref 98–111)
Creatinine, Ser: 0.85 mg/dL (ref 0.44–1.00)
GFR, Estimated: 60 mL/min — ABNORMAL LOW
Glucose, Bld: 99 mg/dL (ref 70–99)
Potassium: 3.8 mmol/L (ref 3.5–5.1)
Sodium: 144 mmol/L (ref 135–145)

## 2024-09-04 ENCOUNTER — Encounter: Payer: Self-pay | Admitting: Adult Health

## 2024-09-04 ENCOUNTER — Non-Acute Institutional Stay (SKILLED_NURSING_FACILITY): Payer: Self-pay | Admitting: Adult Health

## 2024-09-04 DIAGNOSIS — G301 Alzheimer's disease with late onset: Secondary | ICD-10-CM | POA: Diagnosis not present

## 2024-09-04 DIAGNOSIS — F02C0 Dementia in other diseases classified elsewhere, severe, without behavioral disturbance, psychotic disturbance, mood disturbance, and anxiety: Secondary | ICD-10-CM

## 2024-09-04 DIAGNOSIS — J9611 Chronic respiratory failure with hypoxia: Secondary | ICD-10-CM

## 2024-09-04 DIAGNOSIS — I700 Atherosclerosis of aorta: Secondary | ICD-10-CM

## 2024-09-04 NOTE — Progress Notes (Signed)
 " Location:  [facility] Room Number: 146 Place of Service:  SNF (31)   CODE STATUS: dnr   Allergies[1]  Chief Complaint  Patient presents with   Acute Visit    Care plan meeting     HPI:  We have come together for her care plan meeting. BIMS 0/30 mood 0/30. She is without falls. She requires maximum to dependent assist with her adl care. She is frequently incontinent of bladder and bowel. Dietary: D1 diet; setup for meals; appetite 51-100% of meals weight is 143 pounds. Therapy: none at this time. Activities: 1:1. She will continue to be followed for her chronic illnesses including:  Aortic atherosclerosis    Chronic respiratory failure with hypoxia    Severe late onset alzheimer's disease without behavioral disturbance, psychotic disturbance, mood disturbance or anxiety  Past Medical History:  Diagnosis Date   Allergic rhinitis 11/25/2016   Anemia    Arthritis    mild, no known falls uses cane   Dementia (HCC)    Dementia of the Alzheimer's type, with late onset, with delirium (HCC) 12/08/2021   Hypertension    Neurofibroma 05/29/2017   Right breast skin lesion- pathology neurofibroma     Past Surgical History:  Procedure Laterality Date   MASS EXCISION Right 06/07/2017   Procedure: EXCISION SKIN LESION OF RIGHT BREAST;  Surgeon: Kallie Manuelita BROCKS, MD;  Location: [facility];  Service: General;  Laterality: Right;    Social History   Socioeconomic History   Marital status: Widowed    Spouse name: Not on file   Number of children: 0   Years of education: Not on file   Highest education level: Not on file  Occupational History   Not on file  Tobacco Use   Smoking status: Never   Smokeless tobacco: Never  Vaping Use   Vaping status: Never Used  Substance and Sexual Activity   Alcohol use: No   Drug use: No   Sexual activity: Not Currently  Other Topics Concern   Not on file  Social History Narrative   Not on file   Social Drivers of Health    Tobacco Use: Low Risk (09/04/2024)   Patient History    Smoking Tobacco Use: Never    Smokeless Tobacco Use: Never    Passive Exposure: Not on file  Financial Resource Strain: Not on file  Food Insecurity: Not on file  Transportation Needs: Not on file  Physical Activity: Not on file  Stress: Not on file  Social Connections: Not on file  Intimate Partner Violence: Not on file  Depression (PHQ2-9): Low Risk (01/02/2024)   Depression (PHQ2-9)    PHQ-2 Score: 0  Alcohol Screen: Not on file  Housing: Not on file  Utilities: Not on file  Health Literacy: Not on file   Family History  Problem Relation Age of Onset   Diabetes Sister    Hypertension Sister       VITAL SIGNS BP 111/66   Pulse 63   Temp 97.8 F (36.6 C)   Resp 18   Ht 4' 11 (1.499 m)   Wt 143 lb 6.4 oz (65 kg)   SpO2 99%   BMI 28.96 kg/m   Outpatient Encounter Medications as of 09/04/2024  Medication Sig   acetaminophen  (TYLENOL ) 325 MG tablet Take 650 mg by mouth every 6 (six) hours as needed.   albuterol  (PROVENTIL ) (2.5 MG/3ML) 0.083% nebulizer solution Take 2.5 mg by nebulization every 4 (four) hours as needed  for wheezing or shortness of breath.   NON FORMULARY Diet: Dysphagia 1 (puree) with thin liquids   Nutritional Supplements (,FEEDING SUPPLEMENT, PROSOURCE PLUS) liquid Take 30 mLs by mouth 2 (two) times daily between meals.   polyethylene glycol (MIRALAX  / GLYCOLAX ) 17 g packet Take 17 g by mouth daily.   polyvinyl alcohol (LIQUIFILM TEARS) 1.4 % ophthalmic solution Place 1 drop into both eyes in the morning and at bedtime. Wait at least 5 minutes between multiple drops in same eye.   No facility-administered encounter medications on file as of 09/04/2024.     SIGNIFICANT DIAGNOSTIC EXAMS  LABS REVIEWED:        04-18-23: wbc 5.0; hgb 12.6; hct 39.4 mcv 96.6 plt 153; glucose 87; bun 18; creat 0.91; k+ 3.7; na++ 139; ca 8.4 gfr 55; protein 5.5 albumin 2.9 08-16-23: glucose 116; bun 19; creat  1.04 ;k+ 36; na++ 139; ca 9.0; gfr 47   10-17-23: wbc 5.1; hgb 12.4; hct 38.9; mcv 96.0 plt 160; glucose 86; bun 19; creat 0.89; k+ 3.7; na++ 140 ; ca 8.6; gfr 57; protein 5.9; albumin 3.1; vitamin B 12: 220  NO NEW LABS.    Review of Systems  Unable to perform ROS: Dementia    Physical Exam Constitutional:      General: She is not in acute distress.    Appearance: She is well-developed. She is not diaphoretic.  Neck:     Thyroid : No thyromegaly.  Cardiovascular:     Rate and Rhythm: Normal rate and regular rhythm.     Heart sounds: Normal heart sounds.  Pulmonary:     Effort: Pulmonary effort is normal. No respiratory distress.     Breath sounds: Normal breath sounds.  Abdominal:     General: Bowel sounds are normal. There is no distension.     Palpations: Abdomen is soft.     Tenderness: There is no abdominal tenderness.  Musculoskeletal:     Cervical back: Neck supple.     Right lower leg: No edema.     Left lower leg: No edema.  Lymphadenopathy:     Cervical: No cervical adenopathy.  Skin:    General: Skin is warm and dry.  Neurological:     Mental Status: She is alert. Mental status is at baseline.  Psychiatric:        Mood and Affect: Mood normal.       ASSESSMENT/ PLAN:  TODAY  Aortic atherosclerosis Chronic respiratory failure with hypoxia Severe late onset alzheimer's disease without behavioral disturbance, psychotic disturbance, mood disturbance or anxiety  Will continue current plan of care Will continue current medications Will continue to monitor her status.    Time spent with patient: 40 minutes: medications; dietary; plan of care.    Barnie Seip NP [facility]  call [phone]     [1] No Known Allergies  "

## 2024-09-15 ENCOUNTER — Encounter: Payer: Self-pay | Admitting: Adult Health

## 2024-09-15 ENCOUNTER — Non-Acute Institutional Stay (SKILLED_NURSING_FACILITY): Payer: Self-pay | Admitting: Adult Health

## 2024-09-15 DIAGNOSIS — K5909 Other constipation: Secondary | ICD-10-CM | POA: Diagnosis not present

## 2024-09-15 DIAGNOSIS — E785 Hyperlipidemia, unspecified: Secondary | ICD-10-CM

## 2024-09-15 DIAGNOSIS — D696 Thrombocytopenia, unspecified: Secondary | ICD-10-CM | POA: Diagnosis not present
